# Patient Record
Sex: Male | Born: 1945
Health system: Southern US, Community
[De-identification: ages and names within clinical notes are randomized; demographics above are authoritative.]

## PROBLEM LIST (undated history)

## (undated) DIAGNOSIS — I119 Hypertensive heart disease without heart failure: Secondary | ICD-10-CM

## (undated) DIAGNOSIS — I499 Cardiac arrhythmia, unspecified: Secondary | ICD-10-CM

## (undated) DIAGNOSIS — G629 Polyneuropathy, unspecified: Secondary | ICD-10-CM

## (undated) DIAGNOSIS — E119 Type 2 diabetes mellitus without complications: Secondary | ICD-10-CM

## (undated) DIAGNOSIS — F32A Depression, unspecified: Secondary | ICD-10-CM

## (undated) DIAGNOSIS — F329 Major depressive disorder, single episode, unspecified: Secondary | ICD-10-CM

## (undated) DIAGNOSIS — M1990 Unspecified osteoarthritis, unspecified site: Secondary | ICD-10-CM

## (undated) DIAGNOSIS — I10 Essential (primary) hypertension: Secondary | ICD-10-CM

## (undated) DIAGNOSIS — I63512 Cerebral infarction due to unspecified occlusion or stenosis of left middle cerebral artery: Secondary | ICD-10-CM

## (undated) DIAGNOSIS — Z9884 Bariatric surgery status: Secondary | ICD-10-CM

## (undated) DIAGNOSIS — F0280 Dementia in other diseases classified elsewhere without behavioral disturbance: Secondary | ICD-10-CM

## (undated) DIAGNOSIS — G300 Alzheimer's disease with early onset: Secondary | ICD-10-CM

## (undated) DIAGNOSIS — E785 Hyperlipidemia, unspecified: Secondary | ICD-10-CM

## (undated) DIAGNOSIS — I5032 Chronic diastolic (congestive) heart failure: Secondary | ICD-10-CM

## (undated) DIAGNOSIS — H9193 Unspecified hearing loss, bilateral: Secondary | ICD-10-CM

## (undated) DIAGNOSIS — I421 Obstructive hypertrophic cardiomyopathy: Secondary | ICD-10-CM

## (undated) DIAGNOSIS — I639 Cerebral infarction, unspecified: Secondary | ICD-10-CM

## (undated) DIAGNOSIS — R4701 Aphasia: Secondary | ICD-10-CM

## (undated) DIAGNOSIS — M109 Gout, unspecified: Secondary | ICD-10-CM

## (undated) DIAGNOSIS — Z87442 Personal history of urinary calculi: Secondary | ICD-10-CM

## (undated) DIAGNOSIS — R4781 Slurred speech: Secondary | ICD-10-CM

## (undated) DIAGNOSIS — G4733 Obstructive sleep apnea (adult) (pediatric): Secondary | ICD-10-CM

## (undated) DIAGNOSIS — G4730 Sleep apnea, unspecified: Secondary | ICD-10-CM

## (undated) DIAGNOSIS — I480 Paroxysmal atrial fibrillation: Secondary | ICD-10-CM

## (undated) HISTORY — DX: Hypertensive heart disease without heart failure: I11.9

## (undated) HISTORY — DX: Obstructive sleep apnea (adult) (pediatric): G47.33

## (undated) HISTORY — DX: Cerebral infarction due to unspecified occlusion or stenosis of left middle cerebral artery: I63.512

## (undated) HISTORY — DX: Type 2 diabetes mellitus without complications: E11.9

## (undated) HISTORY — DX: Aphasia: R47.01

## (undated) HISTORY — DX: Hyperlipidemia, unspecified: E78.5

## (undated) HISTORY — DX: Slurred speech: R47.81

## (undated) HISTORY — DX: Essential (primary) hypertension: I10

## (undated) HISTORY — DX: Cerebral infarction, unspecified: I63.9

## (undated) HISTORY — DX: Chronic diastolic (congestive) heart failure: I50.32

## (undated) HISTORY — PX: ARTHROSCOPIC REPAIR PCL: SUR81

## (undated) HISTORY — PX: TONSILLECTOMY: SUR1361

## (undated) HISTORY — PX: JOINT REPLACEMENT: SHX530

## (undated) HISTORY — PX: LAPAROSCOPIC GASTRIC BANDING: SHX1100

## (undated) HISTORY — PX: HERNIA REPAIR: SHX51

## (undated) HISTORY — PX: KNEE SURGERY: SHX244

## (undated) HISTORY — DX: Obstructive hypertrophic cardiomyopathy: I42.1

## (undated) HISTORY — DX: Paroxysmal atrial fibrillation: I48.0

---

## 1998-12-19 ENCOUNTER — Ambulatory Visit (HOSPITAL_BASED_OUTPATIENT_CLINIC_OR_DEPARTMENT_OTHER): Admission: RE | Admit: 1998-12-19 | Discharge: 1998-12-19 | Payer: Self-pay | Admitting: Orthopedic Surgery

## 1999-01-04 ENCOUNTER — Emergency Department (HOSPITAL_COMMUNITY): Admission: EM | Admit: 1999-01-04 | Discharge: 1999-01-04 | Payer: Self-pay | Admitting: Emergency Medicine

## 1999-08-27 ENCOUNTER — Encounter: Payer: Self-pay | Admitting: Gastroenterology

## 1999-08-27 ENCOUNTER — Ambulatory Visit (HOSPITAL_COMMUNITY): Admission: RE | Admit: 1999-08-27 | Discharge: 1999-08-27 | Payer: Self-pay | Admitting: Gastroenterology

## 1999-12-12 ENCOUNTER — Ambulatory Visit (HOSPITAL_COMMUNITY): Admission: RE | Admit: 1999-12-12 | Discharge: 1999-12-12 | Payer: Self-pay | Admitting: Rheumatology

## 1999-12-12 ENCOUNTER — Encounter: Payer: Self-pay | Admitting: Rheumatology

## 2001-08-03 ENCOUNTER — Ambulatory Visit (HOSPITAL_COMMUNITY): Admission: RE | Admit: 2001-08-03 | Discharge: 2001-08-03 | Payer: Self-pay | Admitting: *Deleted

## 2001-09-21 ENCOUNTER — Encounter: Payer: Self-pay | Admitting: Surgery

## 2001-09-24 ENCOUNTER — Ambulatory Visit (HOSPITAL_COMMUNITY): Admission: RE | Admit: 2001-09-24 | Discharge: 2001-09-24 | Payer: Self-pay | Admitting: Surgery

## 2003-02-09 ENCOUNTER — Ambulatory Visit (HOSPITAL_COMMUNITY): Admission: RE | Admit: 2003-02-09 | Discharge: 2003-02-09 | Payer: Self-pay | Admitting: Gastroenterology

## 2003-04-11 ENCOUNTER — Ambulatory Visit (HOSPITAL_BASED_OUTPATIENT_CLINIC_OR_DEPARTMENT_OTHER): Admission: RE | Admit: 2003-04-11 | Discharge: 2003-04-11 | Payer: Self-pay | Admitting: Internal Medicine

## 2003-04-29 HISTORY — PX: TARSAL TUNNEL RELEASE: SUR1099

## 2003-08-08 ENCOUNTER — Observation Stay (HOSPITAL_COMMUNITY): Admission: RE | Admit: 2003-08-08 | Discharge: 2003-08-09 | Payer: Self-pay | Admitting: Orthopedic Surgery

## 2003-08-08 ENCOUNTER — Encounter (INDEPENDENT_AMBULATORY_CARE_PROVIDER_SITE_OTHER): Payer: Self-pay | Admitting: *Deleted

## 2004-08-24 ENCOUNTER — Emergency Department (HOSPITAL_COMMUNITY): Admission: EM | Admit: 2004-08-24 | Discharge: 2004-08-24 | Payer: Self-pay | Admitting: Emergency Medicine

## 2005-05-28 ENCOUNTER — Ambulatory Visit (HOSPITAL_BASED_OUTPATIENT_CLINIC_OR_DEPARTMENT_OTHER): Admission: RE | Admit: 2005-05-28 | Discharge: 2005-05-28 | Payer: Self-pay | Admitting: Internal Medicine

## 2005-06-01 ENCOUNTER — Ambulatory Visit: Payer: Self-pay | Admitting: Internal Medicine

## 2006-01-22 ENCOUNTER — Encounter: Admission: RE | Admit: 2006-01-22 | Discharge: 2006-01-22 | Payer: Self-pay | Admitting: Internal Medicine

## 2009-12-02 ENCOUNTER — Emergency Department (HOSPITAL_COMMUNITY)
Admission: EM | Admit: 2009-12-02 | Discharge: 2009-12-02 | Payer: Self-pay | Source: Home / Self Care | Admitting: Emergency Medicine

## 2009-12-07 ENCOUNTER — Observation Stay (HOSPITAL_COMMUNITY): Admission: RE | Admit: 2009-12-07 | Discharge: 2009-12-08 | Payer: Self-pay | Admitting: Urology

## 2009-12-20 ENCOUNTER — Ambulatory Visit (HOSPITAL_COMMUNITY): Admission: RE | Admit: 2009-12-20 | Discharge: 2009-12-20 | Payer: Self-pay | Admitting: Urology

## 2010-03-04 ENCOUNTER — Encounter
Admission: RE | Admit: 2010-03-04 | Discharge: 2010-03-04 | Payer: Self-pay | Source: Home / Self Care | Attending: General Surgery | Admitting: General Surgery

## 2010-03-05 ENCOUNTER — Ambulatory Visit (HOSPITAL_COMMUNITY): Admission: RE | Admit: 2010-03-05 | Discharge: 2010-03-05 | Payer: Self-pay | Admitting: General Surgery

## 2010-06-24 ENCOUNTER — Encounter: Attending: General Surgery | Admitting: *Deleted

## 2010-06-24 DIAGNOSIS — Z01818 Encounter for other preprocedural examination: Secondary | ICD-10-CM | POA: Insufficient documentation

## 2010-06-24 DIAGNOSIS — Z713 Dietary counseling and surveillance: Secondary | ICD-10-CM | POA: Insufficient documentation

## 2010-07-01 ENCOUNTER — Other Ambulatory Visit: Payer: Self-pay | Admitting: General Surgery

## 2010-07-01 ENCOUNTER — Encounter (HOSPITAL_COMMUNITY): Attending: General Surgery

## 2010-07-01 DIAGNOSIS — Z01818 Encounter for other preprocedural examination: Secondary | ICD-10-CM | POA: Insufficient documentation

## 2010-07-01 DIAGNOSIS — Z01811 Encounter for preprocedural respiratory examination: Secondary | ICD-10-CM | POA: Insufficient documentation

## 2010-07-01 LAB — SURGICAL PCR SCREEN: MRSA, PCR: NEGATIVE

## 2010-07-01 LAB — COMPREHENSIVE METABOLIC PANEL
CO2: 26 mEq/L (ref 19–32)
Chloride: 106 mEq/L (ref 96–112)
GFR calc non Af Amer: >60 mL/min (ref >60)
Glucose, Bld: 107 mg/dL — ABNORMAL HIGH (ref 70–99)
Potassium: 4.5 mEq/L (ref 3.5–5.1)
Sodium: 140 mEq/L (ref 135–145)
Total Protein: 7.9 g/dL (ref 6.0–8.3)

## 2010-07-01 LAB — CBC
HCT: 48 % (ref 39.0–52.0)
MCH: 30.3 pg (ref 26.0–34.0)
Platelets: 192 10*3/uL (ref 150–400)
RBC: 5.05 MIL/uL (ref 4.22–5.81)
RDW: 14 % (ref 11.5–15.5)
WBC: 6.3 10*3/uL (ref 4.0–10.5)

## 2010-07-01 LAB — DIFFERENTIAL
Basophils Absolute: 0 10*3/uL (ref 0.0–0.1)
Eosinophils Relative: 3 % (ref 0–5)

## 2010-07-09 ENCOUNTER — Ambulatory Visit (HOSPITAL_COMMUNITY)
Admission: RE | Admit: 2010-07-09 | Discharge: 2010-07-10 | Disposition: A | Discharge status: Home / Self Care | Source: Ambulatory Visit | Attending: General Surgery | Admitting: General Surgery

## 2010-07-09 DIAGNOSIS — E119 Type 2 diabetes mellitus without complications: Secondary | ICD-10-CM | POA: Insufficient documentation

## 2010-07-09 DIAGNOSIS — K449 Diaphragmatic hernia without obstruction or gangrene: Secondary | ICD-10-CM | POA: Insufficient documentation

## 2010-07-09 DIAGNOSIS — K219 Gastro-esophageal reflux disease without esophagitis: Secondary | ICD-10-CM | POA: Insufficient documentation

## 2010-07-09 DIAGNOSIS — G4733 Obstructive sleep apnea (adult) (pediatric): Secondary | ICD-10-CM | POA: Insufficient documentation

## 2010-07-09 DIAGNOSIS — Z6839 Body mass index (BMI) 39.0-39.9, adult: Secondary | ICD-10-CM | POA: Insufficient documentation

## 2010-07-09 DIAGNOSIS — I10 Essential (primary) hypertension: Secondary | ICD-10-CM | POA: Insufficient documentation

## 2010-07-09 DIAGNOSIS — M129 Arthropathy, unspecified: Secondary | ICD-10-CM | POA: Insufficient documentation

## 2010-07-09 LAB — GLUCOSE, CAPILLARY: Glucose-Capillary: 145 mg/dL — ABNORMAL HIGH (ref 70–99)

## 2010-07-09 NOTE — Op Note (Signed)
NAME:  Eddie Zimmerman, Eddie Zimmerman NO.:  [id number]  MEDICAL RECORD NO.:  [id number]           PATIENT TYPE:  I  LOCATION:  0002                         FACILITY:  [facility]  PHYSICIAN:  Sharlet Salina T. Kosisochukwu Goldberg, M.D.DATE OF BIRTH:  04-Jan-1946  DATE OF PROCEDURE:  07/09/2010 DATE OF DISCHARGE:                              OPERATIVE REPORT   PREOPERATIVE DIAGNOSIS:  Morbid obesity.  POSTOPERATIVE DIAGNOSIS:  Morbid obesity.  SURGICAL PROCEDURE:  Placement of laparoscopic adjustable gastric band with hiatal hernia repair.  SURGEON:  Lorne Skeens. Kapono Luhn, MD  ASSISTANT:  Mary Sella. Andrey Campanile, MD  ANESTHESIA:  General.  BRIEF HISTORY:  Eddie Zimmerman is a 65 year old male with persistent progressive morbid obesity, unresponsive to medical management, with comorbidities of obstructive sleep apnea, adult onset diabetes mellitus, hypertension, and arthritis, who presents with a BMI of 39.16.  After extensive discussion of options detailed elsewhere, we have elected to proceed with placement of laparoscopic adjustable gastric band for treatment of morbid obesity.  Preoperative workup indicates a small to moderate-sized hiatal hernia and he does have some occasional reflux and we will repair his hernia if confirmed intraoperatively.  DESCRIPTION OF OPERATION:  The patient brought to operating room, placed in supine position on the operating table, and general endotracheal anesthesia was induced.  He received preoperative IV antibiotics and subcutaneous heparin.  PAS were placed.  The abdomen was widely sterilely prepped and draped and correct patient and procedure were verified.  Access was obtained with an 11 mm OptiVu trocar in the left subcostal space and pneumoperitoneum established without difficulty. Under direct vision, a 15 mm trocar was placed laterally in the right upper quadrant.  A 12-mm trocar in the right upper quadrant midclavicular line and 11 mm trocar just above was left  in the umbilicus with camera.  Initially, a 5-mm trocar was placed laterally in the left flank.  Initial inspection of the hiatus showed some dimpling consistent with hiatal hernia.  The sizing tube was placed orally into the stomach and the 15 mL balloon was inflated and brought back and did go up through the hiatus.  The pars flaccid was then divided along the avascular area with the harmonic scalpel and the peritoneum was also incised up to the level the esophagus.  The right crus was exposed. There was a lot of fat in this area that we had good exposure and the peritoneum just anterior to the right crus was carefully incised with cautery and careful blunt dissection was carried back into the retroesophageal space.  The posterior vagus nerve and posterior aspect of the esophagus was identified and dissected anteriorly, protected, and a retroesophageal fat pad dissection down from the hiatus and the moderate-sized hernia found identifying clearly the right and left crura down to the confluence posteriorly.  I then performed a posterior crural repair with 2 interrupted 0 Ethibond sutures.  After closure of the hiatus to a normal-appearing size, the sizing tube was passed down through the hiatus without any difficulty and the 10 mL balloon blown up and brought back against the hiatus and was snugged here.  The balloon was deflated  and the tubing brought back into the upper esophagus.  The angle of His was exposed and peritoneum overlying the left crus was incised and careful blunt dissection was carried back down along the left crus toward the retrogastric space.  Following this, the finger dissector was inserted just anterior to the base of the right crus, passed easily retrogastric and deployed up through the previously dissected area of angle of His.  An AP large flushed band system was introduced in the abdomen.  The tubing passed into the finger dissector, brought retrogastric, and  then the band brought to the retrogastric tunnel without difficulty.  With the sizing tube again passed down into the stomach, the band was locked into place without any undue tension. Sizing tube was removed.  All the tubing toward the patient's fundus imbricated back over the band to the small gastric pouch with 3 interrupted 2-0 Ethibond sutures.  Band appeared to be in excellent position.  There was no bleeding and no evidence of trocar injury.  The nasal retractor was moved under direct vision.  CO2 evacuated and trocars removed after bringing the tubing through the right mid abdominal trocar site.  This incision was lengthened slightly and the tubing was cut and attached to the port which a piece of Prolene mesh was then sutured back.  A subcutaneous pocket was created and the port was positioned subcutaneously just lateral to the incision.  The tubing introduced mainly into the abdomen.  This incision was closed with running subcutaneous 3-0 Vicryl.  Skin incisions were closed with subcuticular Monocryl and Dermabond.  Sponge and needle counts were correct.  The patient was taken to recovery in good condition. Lorne Skeens. Cicley Ganesh, M.D.     Tory Emerald  D:  07/09/2010  T:  07/09/2010  Job:  [id number]  Electronically Signed by Glenna Fellows M.D. on 07/09/2010 07:48:52 PM

## 2010-07-10 ENCOUNTER — Ambulatory Visit (HOSPITAL_COMMUNITY)

## 2010-07-10 LAB — DIFFERENTIAL
Basophils Absolute: 0 10*3/uL (ref 0.0–0.1)
Eosinophils Absolute: 0 10*3/uL (ref 0.0–0.7)
Eosinophils Relative: 0 % (ref 0–5)
Neutro Abs: 9.4 10*3/uL — ABNORMAL HIGH (ref 1.7–7.7)
Neutrophils Relative %: 84 % — ABNORMAL HIGH (ref 43–77)

## 2010-07-10 LAB — GLUCOSE, CAPILLARY: Glucose-Capillary: 112 mg/dL — ABNORMAL HIGH (ref 70–99)

## 2010-07-10 LAB — CBC
MCH: 30 pg (ref 26.0–34.0)
Platelets: 163 10*3/uL (ref 150–400)

## 2010-07-11 LAB — GLUCOSE, CAPILLARY

## 2010-07-12 LAB — DIFFERENTIAL
Basophils Absolute: 0 10*3/uL (ref 0.0–0.1)
Basophils Absolute: 0 10*3/uL (ref 0.0–0.1)
Basophils Relative: 0 % (ref 0–1)
Eosinophils Absolute: 0 10*3/uL (ref 0.0–0.7)
Eosinophils Relative: 0 % (ref 0–5)
Eosinophils Relative: 1 % (ref 0–5)
Lymphocytes Relative: 7 % — ABNORMAL LOW (ref 12–46)
Monocytes Absolute: 1.1 10*3/uL — ABNORMAL HIGH (ref 0.1–1.0)
Monocytes Absolute: 1.8 10*3/uL — ABNORMAL HIGH (ref 0.1–1.0)
Monocytes Relative: 12 % (ref 3–12)
Monocytes Relative: 14 % — ABNORMAL HIGH (ref 3–12)
Neutro Abs: 10.7 10*3/uL — ABNORMAL HIGH (ref 1.7–7.7)
Neutro Abs: 6.9 10*3/uL (ref 1.7–7.7)

## 2010-07-12 LAB — URINALYSIS, ROUTINE W REFLEX MICROSCOPIC
Bilirubin Urine: NEGATIVE
Glucose, UA: NEGATIVE mg/dL
Protein, ur: NEGATIVE mg/dL
Urobilinogen, UA: 0.2 mg/dL (ref 0.0–1.0)
pH: 6 (ref 5.0–8.0)

## 2010-07-12 LAB — CBC
HCT: 36.1 % — ABNORMAL LOW (ref 39.0–52.0)
HCT: 38.5 % — ABNORMAL LOW (ref 39.0–52.0)
Hemoglobin: 12.2 g/dL — ABNORMAL LOW (ref 13.0–17.0)
Hemoglobin: 13.2 g/dL (ref 13.0–17.0)
MCH: 31.7 pg (ref 26.0–34.0)
MCHC: 33.8 g/dL (ref 30.0–36.0)
MCV: 93.6 fL (ref 78.0–100.0)
MCV: 93.6 fL (ref 78.0–100.0)
RDW: 14.5 % (ref 11.5–15.5)
RDW: 14.6 % (ref 11.5–15.5)
WBC: 13.5 10*3/uL — ABNORMAL HIGH (ref 4.0–10.5)

## 2010-07-12 LAB — COMPREHENSIVE METABOLIC PANEL
Alkaline Phosphatase: 52 U/L (ref 39–117)
BUN: 13 mg/dL (ref 6–23)
CO2: 29 mEq/L (ref 19–32)
Chloride: 102 mEq/L (ref 96–112)
Creatinine, Ser: 1.86 mg/dL — ABNORMAL HIGH (ref 0.4–1.5)
GFR calc Af Amer: 45 mL/min — ABNORMAL LOW (ref >60)
Sodium: 139 mEq/L (ref 135–145)
Total Protein: 7.2 g/dL (ref 6.0–8.3)

## 2010-07-12 LAB — GLUCOSE, CAPILLARY

## 2010-07-12 LAB — URINE CULTURE: Colony Count: 35000

## 2010-07-12 LAB — BASIC METABOLIC PANEL
BUN: 12 mg/dL (ref 6–23)
BUN: 13 mg/dL (ref 6–23)
CO2: 30 mEq/L (ref 19–32)
Chloride: 101 mEq/L (ref 96–112)
Chloride: 101 mEq/L (ref 96–112)
GFR calc non Af Amer: 49 mL/min — ABNORMAL LOW (ref >60)
Glucose, Bld: 143 mg/dL — ABNORMAL HIGH (ref 70–99)
Glucose, Bld: 179 mg/dL — ABNORMAL HIGH (ref 70–99)
Potassium: 3.7 mEq/L (ref 3.5–5.1)
Potassium: 4.2 mEq/L (ref 3.5–5.1)
Sodium: 138 mEq/L (ref 135–145)
Sodium: 139 mEq/L (ref 135–145)

## 2010-07-12 LAB — URINE MICROSCOPIC-ADD ON

## 2010-07-30 ENCOUNTER — Encounter: Attending: General Surgery

## 2010-07-30 DIAGNOSIS — Z09 Encounter for follow-up examination after completed treatment for conditions other than malignant neoplasm: Secondary | ICD-10-CM | POA: Insufficient documentation

## 2010-07-30 DIAGNOSIS — Z9884 Bariatric surgery status: Secondary | ICD-10-CM | POA: Insufficient documentation

## 2010-07-30 DIAGNOSIS — Z713 Dietary counseling and surveillance: Secondary | ICD-10-CM | POA: Insufficient documentation

## 2010-09-10 ENCOUNTER — Ambulatory Visit: Admitting: *Deleted

## 2010-09-13 NOTE — Op Note (Signed)
[place]. [facility]  Patient:    Eddie Zimmerman, Eddie Zimmerman Visit Number: [id number] MRN: [id number]          Service Type: DSU Location: DAY Attending Physician:  Andre Lefort Dictated by:   Sandria Bales. Ezzard Standing, M.D. Proc. Date: 09/24/01 Admit Date:  09/24/2001 Discharge Date: 09/24/2001   CC:         Cherie Ouch, M.D.   Operative Report  DATE OF BIRTH:  1946/02/01. [facility] [phone].  PREOPERATIVE DIAGNOSIS:  Umbilical hernia.  POSTOPERATIVE DIAGNOSES: 1. Umbilical hernia. 2. Small ventral hernia cephalad to umbilical hernia.  PROCEDURE:  Repair of both umbilical and ventral hernia.  SURGEON:  Kristine Garbe. Ezzard Standing, M.D.  ANESTHESIA:  General LMA with 50 cc of 0.25% Marcaine.  INDICATION FOR PROCEDURE:  [name] is a 65 year old white male who has noticed an umbilical hernia for three to four months, had some severe pain within the last couple of weeks, and now comes for repair of this hernia.  DESCRIPTION OF PROCEDURE:  The patient was placed in a supine position and given a general LMA anesthesia.  His abdomen is shaved, prepped with Betadine solution, and sterilely draped.  A smiling infraumbilical incision was made with sharp dissection and elevating the skin of the umbilicus off the fascia. The patient had an approximately 2 cm defect at the umbilicus.  This was reduced without difficulty.  Then about 2 cm cephalad to the umbilical hernia the patient also had a very small ventral hernia with maybe a 1 cm defect in the fascia with some preperitoneal fat protruding through this.  I freed up skin and subcutaneous tissues from around each hernia defect.  I closed the ventral hernia with a single figure-of-eight 0 Novofil suture, and I closed the umbilical hernia with interrupted, inverted 0 Novofil sutures.  The wound was then irrigated.  The skin was tacked back down to the fascia using 3-0 Vicryl sutures.  The skin was  closed with a 5-0 Vicryl suture, painted with tincture of benzoin and Steri-Strips and sterilely dressed.  The patient tolerated the procedure well, was transported to the recovery room in good condition.  Sponge and needle count were reported to be correct.  The patient will be discharged home today.  He will be given Vicodin for pain and wear an abdominal binder and be seen back in the office in two weeks. Dictated by:   Sandria Bales. Ezzard Standing, M.D. Attending Physician:  Andre Lefort DD:  09/24/01 TD:  09/27/01 Job: [id number]

## 2010-09-13 NOTE — Op Note (Signed)
NAME:  Eddie Zimmerman, Eddie Zimmerman                          ACCOUNT NO.:  [id number]   MEDICAL RECORD NO.:  [id number]                   PATIENT TYPE:  OBV   LOCATION:  0470                                 FACILITY:  [facility]   PHYSICIAN:  Dionne Ano. Everlene Other, M.D.         DATE OF BIRTH:  [birth year]   DATE OF PROCEDURE:  08/08/2003  DATE OF DISCHARGE:                                 OPERATIVE REPORT   PREOPERATIVE DIAGNOSES:  1. Painful right elbow mass.  2. Painful right forearm mass.  3. Osteophyte about the right olecranon with previous olecranon mass removal     being noted.   POSTOPERATIVE DIAGNOSES:  1. Painful right elbow mass.  2. Painful right forearm mass.  3. Osteophyte about the right olecranon with previous olecranon mass removal     being noted.   PROCEDURE:  1. Right elbow large olecranon mass removal.  2. Right elbow triceps debridement.  3. Right elbow partial ostectomy about the olecranon (removal of olecranon     spur).  4. Right elbow ulnar nerve release/neurolysis.  5. Medal antebrachial cutaneous nerve neurolysis.  6. Burying procedure secondary to previous neuroma which appeared to be     encased in scar.  7. Removal of forearm mass 5 x 5 cm deep in location.  8. Stress radiography.   SURGEON:  Dominica Severin, M.D.   ASSISTANT:  Karie Chimera, P.A.-C.   COMPLICATIONS:  None.   SPECIMENS:  Two.   DRAINS:  One.   INDICATIONS FOR PROCEDURE:  This patient presents with the above-mentioned  diagnoses.  I have counseled him in regards to the risks and benefits of  surgery, including the risk of infection, bleeding, anesthesia, damage to  normal structures, and failure of the surgery to accomplish its intended  goals of relieving symptoms and restoring function.  With this in mind, he  desires to proceed.  All questions have been encouraged and answered  preoperatively.   OPERATIVE FINDINGS:  This patient had two separate masses deep in location  removed.   Partial olecranon ostectomy with removal of spur and triceps  debridement was performed as was stress radiography and a neurolysis/limited  decompression of the ulnar nerve and medial antebrachial cutaneous nerve,  neurolysis and burying procedure.  The patient had no complicating features  with the surgery.   OPERATION IN DETAIL:  The patient was seen by myself and anesthesia, was  counseled in the preop holding area.  His infraclavicular block was somewhat  incomplete and thus the patient underwent a general anesthetic once in the  operative suite.  The general anesthetic was performed under the direction  of Dr. Council Mechanic.  Following this, he was laid supine, appropriately padded,  prepped and draped in the usual sterile fashion about the right upper  extremity.  I padded him nicely, and I gave him preoperative Ancef for  antibiotic prophylaxis.  Following this, the arm was elevated.  Tourniquet  was inflated to 250 mmHg.  An incision posteriorly was made with a removal  of the portion of the skin in this region.  I then dissected down and  circumferentially identified the large posterior mass.  It was removed off  of the triceps and olecranon region.  It was sent for specimen.  This was  quite large and during the dissection, medial antebrachial cutaneous nerve  neurolysis was performed with a noted neuroma being present which was cut  and then buried.  This was likely related to his previous surgery, in my  opinion.  The patient had a large amount of scar tissue which was  meticulously dissected.  During the course of this, I did perform a limited  release of the ulnar nerve and neurolysis of the nerve so as to protect it  and keep it away from harm.  This was encased in scar tissue and was  released.   Thus, a neurolysis of the ulnar nerve at the elbow and medial antebrachial  cutaneous nerve with burying of a neuroma stump at its end were performed  without difficulty as was removal  of a mass about the elbow.  Following  this, I performed a triceps tendon debridement.  I did not detach it but  kept this intact.  The patient then had removal of the bony spur about the  olecranon.  He was meticulously explored and all sebaceous deposits removed.  Following this, a separate 5 x 5 deep mass was removed about the forearm.  This required an extended incision.  The incision was made and lengthened,  and circumferential dissection was carried out to remove this in its  entirety.  This was done to my satisfaction without difficulty.  At this  time, the tourniquet was deflated, and an epinephrine soaked sponge was  placed in the wound.  This allowed for hemostasis as did cautery.  Following  this, I performed stress radiography revealing the olecranon to not have any  significant spurring.  I was pleased with this.  I then placed a medium  Hemovac drain in the wound and performed closure with 3-0 Vicryl tacking  down the subcu to the fascial region so as to lessen the likelihood of a  postop hematoma.  This was done meticulously.  Following this, the patient  then underwent closure of the skin edge with a Prolene suture.  Drain was  hooked up to suction.  Sterile dressing was applied and anterior plaster  slab to prevent excessive flexion was placed.  The patient tolerated this  well without difficulty.  He was taken to the recovery room in stable  condition.  He will be monitored overnight due to the fact that he has sleep  apnea, and we will keep him on a continuous pulse oxygen with his CPAP and  O2.  We will also place him close to a nurses station per my request.  In  addition to this, we will plan of IV antibiotics, pain management according  to his needs and other postop measures.  I have discussed all findings with  his significant other, and we will proceed accordingly.                                               Dionne Ano. Everlene Other, M.D.   Nash Mantis  D:   08/08/2003  T:  08/08/2003  Job:  [id number]

## 2010-09-13 NOTE — Op Note (Signed)
   NAME:  MOXON, MESSLER                          ACCOUNT NO.:  [id number]   MEDICAL RECORD NO.:  [id number]                   PATIENT TYPE:  AMB   LOCATION:  ENDO                                 FACILITY:  [facility]   PHYSICIAN:  Danise Edge, M.D.                DATE OF BIRTH:  Sep 08, 1945   DATE OF PROCEDURE:  02/09/2003  DATE OF DISCHARGE:                                 OPERATIVE REPORT   PROCEDURE PERFORMED:  Colonoscopy.   ENDOSCOPIST:  Charolett Bumpers, M.D.   INDICATIONS FOR PROCEDURE:  Mr. Arshad Oberholzer. Jury is a 65 year old male born  1946-04-01.  Mr. Teicher is scheduled to undergo his first screening  colonoscopy with polypectomy to prevent colon cancer.   PREMEDICATION:  Versed 10 mg, Demerol 50 mg.   DESCRIPTION OF PROCEDURE:  After obtaining informed consent, Mr. Bialas was  placed in the left lateral decubitus position.  I administered intravenous  Demerol and intravenous Versed to achieve conscious sedation for the  procedure.  The patient's blood pressure, oxygen saturation and cardiac  rhythm were monitored throughout the procedure and documented in the medical  record.   Anal inspection was normal.  Digital rectal exam was normal.  The pediatric  Olympus video colonoscope was introduced into the rectum and advanced to  cecum.  Colonic preparation for the exam today was excellent.   Rectum:  Normal.   Sigmoid colon and descending colon:  Left colonic diverticulosis.   Splenic flexure:  Normal.   Transverse colon:  Normal.   Hepatic flexure:  Normal.   Ascending colon:  Normal.   Cecum and ileocecal valve:  Normal.    ASSESSMENT:  Normal screening proctocolonoscopy to the cecum.  No endoscopic  evidence for the presence of colorectal neoplasia.                                                  Danise Edge, M.D.    MJ/MEDQ  D:  02/09/2003  T:  02/09/2003  Job:  [id number]   cc:   Ike Bene, M.D.  [address]   [place]  [place] [address]  Fax: [phone]

## 2010-09-13 NOTE — Procedures (Signed)
NAME:  Eddie Zimmerman, Eddie Zimmerman NO.:  [id number]   MEDICAL RECORD NO.:  [id number]          PATIENT TYPE:  OUT   LOCATION:  SLEEP CENTER                 [facility]ACILITY:  [facility]   PHYSICIAN:  Clinton D. Maple Hudson, M.D. DATE OF BIRTH:  26-Feb-1946   DATE OF STUDY:                              NOCTURNAL POLYSOMNOGRAM   REFERRING PHYSICIAN:  Dr. Ladell Pier.   DATE OF STUDY:  May 28, 2005.   INDICATION FOR STUDY:  Hypersomnia with sleep apnea.   EPWORTH SLEEPINESS SCORE:  8/24.   BMI:  35.7.   WEIGHT:  250 pounds.   A baseline diagnostic study on April 11, 2003 had reported an AHI of 80  per hour. C-PAP has been used at 12 CWP using an Innomed Nasalaire nasal  pillows as mask.   SLEEP ARCHITECTURE:  Total sleep time 365 minutes with sleep efficiency 83%.  Stage I 5%, stage II 78%, stages III and IV 9%, REM 9% of total sleep time.  Sleep latency 27 minutes, REM latency 189 minutes, awake after sleep onset  49 minutes, arousal index 7.7. Bedtime medication not reported.   RESPIRATORY DATA:  C-PAP titration protocol: C-PAP was titrated from 12 CWP  (AHI 5.1 per hour) to 17 CWP. He sustained adequate control in most sleep  positions at a pressure 13 CWP (AHI 2.9 per hour). Because of some minor  residual snoring, the technician tried him at 16 CWP, AHI 4.6 per hour. The  patient's home C-PAP mask was used with heated humidifier.   OXYGEN DATA:  Breakthrough snoring primarily at lower pressures, controlled  at 16 CWP. Oxygen saturation on C-PAP control held 92 to 96% on room air.   CARDIAC DATA:  Sinus rhythm with occasional PVC.   MOVEMENT/PARASOMNIA:  Occasional leg jerks with little effect on sleep.   IMPRESSION/RECOMMENDATIONS:  1.  C-PAP titration to a suggested home trial pressure of 16 CWP, AHI 4.6      per hour. This seem to give best control of breakthrough snoring but      adequate event control was obtained at 13 CWP and a lower pressure may      be more  comfortable. His home mask was used with heated humidifier.  2.  Oxygen saturation fluctuated. There may be underlying cardiopulmonary      disease or this may reflect the mild instability of nasal pillows style      masks. If there is concern, overnight oxygen saturation monitoring can      be performed while he wears his C-PAP on room air at home. Baseline      diagnostic NPSG on April 11, 2003 had reported an AHI of 80 per hour      with recommended C-PAP setting at that time 11 CWP.      Clinton D. Maple Hudson, M.D.  Diplomate, Biomedical engineer of Sleep Medicine  Electronically Signed     CDY/MEDQ  D:  06/01/2005 13:48:28  T:  06/02/2005 01:16:33  Job:  [id number]

## 2010-11-08 ENCOUNTER — Encounter (INDEPENDENT_AMBULATORY_CARE_PROVIDER_SITE_OTHER): Payer: Self-pay | Admitting: General Surgery

## 2010-11-21 ENCOUNTER — Encounter (INDEPENDENT_AMBULATORY_CARE_PROVIDER_SITE_OTHER)

## 2010-11-21 ENCOUNTER — Ambulatory Visit (INDEPENDENT_AMBULATORY_CARE_PROVIDER_SITE_OTHER): Admitting: General Surgery

## 2010-11-21 ENCOUNTER — Encounter (INDEPENDENT_AMBULATORY_CARE_PROVIDER_SITE_OTHER): Payer: Self-pay | Admitting: General Surgery

## 2010-11-21 NOTE — Patient Instructions (Signed)
Get back into exercise routine. Concentrate on eating slowly with small bites. Separate liquids and solids.

## 2010-11-21 NOTE — Progress Notes (Signed)
Patient returns for followup status post lap band placement March 2012. His last physical was in May. Since his last visit he fell and broke a rib and has unfortunately been unable to exercise. He has had a couple of episodes of food sticking but can relate this to eating too fast. If he eats slowly he is definitely able to be more than a cup of food.  On examination his weight is down just over a pound to 243.8 which represents a 29.8 pound weight loss from preop.  With this information we went ahead with a 1/2 cc fill today. We reviewed diet and exercise strategies. He is now able to begin exercising regularly. He will return in 4-6 weeks.

## 2011-01-03 ENCOUNTER — Encounter (INDEPENDENT_AMBULATORY_CARE_PROVIDER_SITE_OTHER): Payer: Self-pay | Admitting: General Surgery

## 2011-02-07 ENCOUNTER — Ambulatory Visit (INDEPENDENT_AMBULATORY_CARE_PROVIDER_SITE_OTHER): Admitting: General Surgery

## 2011-02-07 ENCOUNTER — Encounter (INDEPENDENT_AMBULATORY_CARE_PROVIDER_SITE_OTHER): Payer: Self-pay | Admitting: General Surgery

## 2011-02-07 DIAGNOSIS — Z4651 Encounter for fitting and adjustment of gastric lap band: Secondary | ICD-10-CM

## 2011-02-07 NOTE — Patient Instructions (Addendum)
Liquid diet only until Monday. Continue her regular exercise. Call for any problems and return to months.Your weight at presentation was 281 pounds

## 2011-02-07 NOTE — Progress Notes (Signed)
Patient returns for followup of lap band placed July 09, 2010. We last did a fill on November 21, 2010. He did notice some increase in restriction following that and had a couple episodes of food sticking a month or 2 ago. He now however is having gradually over the last restriction. He is able to eat more than a entire chicken breast at a setting. Symptoms of over restriction currently.  On exam his weight is down to 240 which is a 4 pound weight loss since the last fill and a 34 pound weight loss in surgery. Port site looks fine and abdomen is benign.  With this information we went ahead with a 1/2 cc fill which brings him up to 7.5 cc. He will return in 2 months. He was able to tolerate water well after the fill.

## 2011-04-17 ENCOUNTER — Encounter (INDEPENDENT_AMBULATORY_CARE_PROVIDER_SITE_OTHER): Admitting: General Surgery

## 2011-05-21 ENCOUNTER — Encounter (INDEPENDENT_AMBULATORY_CARE_PROVIDER_SITE_OTHER): Admitting: General Surgery

## 2011-07-11 ENCOUNTER — Encounter (INDEPENDENT_AMBULATORY_CARE_PROVIDER_SITE_OTHER): Admitting: General Surgery

## 2011-09-18 ENCOUNTER — Encounter (INDEPENDENT_AMBULATORY_CARE_PROVIDER_SITE_OTHER): Admitting: General Surgery

## 2011-09-19 ENCOUNTER — Encounter (INDEPENDENT_AMBULATORY_CARE_PROVIDER_SITE_OTHER): Admitting: General Surgery

## 2011-11-13 ENCOUNTER — Encounter (INDEPENDENT_AMBULATORY_CARE_PROVIDER_SITE_OTHER): Admitting: General Surgery

## 2012-01-16 ENCOUNTER — Telehealth (INDEPENDENT_AMBULATORY_CARE_PROVIDER_SITE_OTHER): Payer: Self-pay | Admitting: General Surgery

## 2012-01-16 NOTE — Telephone Encounter (Signed)
Left message on voicemail stating the patient needs to call [facility] at [phone] to schedule a follow-up appointment....cef °

## 2012-09-03 ENCOUNTER — Emergency Department (HOSPITAL_COMMUNITY)
Admission: EM | Admit: 2012-09-03 | Discharge: 2012-09-04 | Disposition: A | Discharge status: Home / Self Care | Attending: Emergency Medicine | Admitting: Emergency Medicine

## 2012-09-03 ENCOUNTER — Encounter (HOSPITAL_COMMUNITY): Payer: Self-pay | Admitting: Emergency Medicine

## 2012-09-03 DIAGNOSIS — Z79899 Other long term (current) drug therapy: Secondary | ICD-10-CM | POA: Insufficient documentation

## 2012-09-03 DIAGNOSIS — Z9889 Other specified postprocedural states: Secondary | ICD-10-CM | POA: Insufficient documentation

## 2012-09-03 DIAGNOSIS — Z87442 Personal history of urinary calculi: Secondary | ICD-10-CM | POA: Insufficient documentation

## 2012-09-03 DIAGNOSIS — R300 Dysuria: Secondary | ICD-10-CM | POA: Insufficient documentation

## 2012-09-03 DIAGNOSIS — N23 Unspecified renal colic: Secondary | ICD-10-CM | POA: Insufficient documentation

## 2012-09-03 DIAGNOSIS — Z9884 Bariatric surgery status: Secondary | ICD-10-CM | POA: Insufficient documentation

## 2012-09-03 DIAGNOSIS — M109 Gout, unspecified: Secondary | ICD-10-CM | POA: Insufficient documentation

## 2012-09-03 DIAGNOSIS — Z7982 Long term (current) use of aspirin: Secondary | ICD-10-CM | POA: Insufficient documentation

## 2012-09-03 DIAGNOSIS — E119 Type 2 diabetes mellitus without complications: Secondary | ICD-10-CM | POA: Insufficient documentation

## 2012-09-03 DIAGNOSIS — I10 Essential (primary) hypertension: Secondary | ICD-10-CM | POA: Insufficient documentation

## 2012-09-03 HISTORY — DX: Essential (primary) hypertension: I10

## 2012-09-03 HISTORY — DX: Type 2 diabetes mellitus without complications: E11.9

## 2012-09-03 HISTORY — DX: Gout, unspecified: M10.9

## 2012-09-03 LAB — COMPREHENSIVE METABOLIC PANEL
BUN: 20 mg/dL (ref 6–23)
CO2: 27 mEq/L (ref 19–32)
Calcium: 9.9 mg/dL (ref 8.4–10.5)
Chloride: 101 mEq/L (ref 96–112)
Creatinine, Ser: 1.16 mg/dL (ref 0.50–1.35)
GFR calc non Af Amer: 65 mL/min — ABNORMAL LOW (ref >90)
Total Bilirubin: 0.7 mg/dL (ref 0.3–1.2)

## 2012-09-03 LAB — CBC WITH DIFFERENTIAL/PLATELET
Basophils Relative: 0 % (ref 0–1)
Eosinophils Relative: 3 % (ref 0–5)
HCT: 46.4 % (ref 39.0–52.0)
Hemoglobin: 15.6 g/dL (ref 13.0–17.0)
MCHC: 33.6 g/dL (ref 30.0–36.0)
MCV: 94.3 fL (ref 78.0–100.0)
Monocytes Absolute: 1.2 10*3/uL — ABNORMAL HIGH (ref 0.1–1.0)
Monocytes Relative: 11 % (ref 3–12)
Neutro Abs: 5.7 10*3/uL (ref 1.7–7.7)
RDW: 13.8 % (ref 11.5–15.5)

## 2012-09-03 LAB — URINALYSIS, ROUTINE W REFLEX MICROSCOPIC: Specific Gravity, Urine: 1.028 (ref 1.005–1.030)

## 2012-09-03 LAB — URINE MICROSCOPIC-ADD ON

## 2012-09-03 LAB — POTASSIUM: Potassium: 3.8 mEq/L (ref 3.5–5.1)

## 2012-09-03 MED ORDER — KETOROLAC TROMETHAMINE 10 MG PO TABS: 10.0000 mg | ORAL_TABLET | Freq: Four times a day (QID) | ORAL | Status: DC | PRN

## 2012-09-03 MED ORDER — ONDANSETRON 4 MG PO TBDP: ORAL_TABLET | ORAL | Status: DC

## 2012-09-03 MED ORDER — HYDROMORPHONE HCL PF 1 MG/ML IJ SOLN
1.0000 mg | Freq: Once | INTRAMUSCULAR | Status: AC
  Administered 2012-09-03: 1 mg via INTRAVENOUS
  Filled 2012-09-03: qty 1

## 2012-09-03 MED ORDER — OXYCODONE-ACETAMINOPHEN 5-325 MG PO TABS: 1.0000 | ORAL_TABLET | ORAL | Status: DC | PRN

## 2012-09-03 MED ORDER — TAMSULOSIN HCL 0.4 MG PO CAPS: 0.4000 mg | ORAL_CAPSULE | Freq: Every day | ORAL | Status: DC

## 2012-09-03 MED ORDER — ONDANSETRON HCL 4 MG/2ML IJ SOLN
4.0000 mg | Freq: Once | INTRAMUSCULAR | Status: AC
  Administered 2012-09-03: 4 mg via INTRAVENOUS
  Filled 2012-09-03: qty 2

## 2012-09-03 MED ORDER — KETOROLAC TROMETHAMINE 30 MG/ML IJ SOLN
30.0000 mg | Freq: Once | INTRAMUSCULAR | Status: AC
  Administered 2012-09-03: 30 mg via INTRAVENOUS
  Filled 2012-09-03: qty 1

## 2012-09-03 MED ORDER — SODIUM CHLORIDE 0.9 % IV BOLUS (SEPSIS)
1000.0000 mL | Freq: Once | INTRAVENOUS | Status: AC
  Administered 2012-09-03: 1000 mL via INTRAVENOUS

## 2012-09-03 NOTE — ED Notes (Signed)
Pt c/o R groin and R flank pain, onset 1100, pt has had multiple kidney stones. Pt states pain worse this time. +n/v

## 2012-09-03 NOTE — Discharge Instructions (Signed)

## 2012-09-03 NOTE — ED Provider Notes (Signed)
History     CSN: [id number]  Arrival date & time 09/03/12  2005   First MD Initiated Contact with Patient 09/03/12 2020      Chief Complaint  Patient presents with  . Flank Pain    (Consider location/radiation/quality/duration/timing/severity/associated sxs/prior treatment) HPI Pt with history of multiple bl renal stones followed by Dr Laverle Patter. Pt had normal R renal colic pain starting in R flank and radiating to R groin beginning this AM. Pain was moderately responsive to hydrocodone. No fever or chills. No gross hematuria.  Past Medical History  Diagnosis Date  . Renal disorder     kidney stones  . Diabetes mellitus without complication   . Hypertension   . Gout     Past Surgical History  Procedure Laterality Date  . Laparoscopic gastric banding    . Knee surgery    . Hernia repair      No family history on file.  History  Substance Use Topics  . Smoking status: Never Smoker   . Smokeless tobacco: Never Used  . Alcohol Use: Yes     Comment: rare      Review of Systems  Constitutional: Negative for fever and chills.  Respiratory: Negative for cough and shortness of breath.   Cardiovascular: Negative for chest pain.  Gastrointestinal: Negative for nausea, vomiting and abdominal pain.  Genitourinary: Positive for dysuria and flank pain. Negative for hematuria.  Musculoskeletal: Negative for myalgias.  Skin: Negative for rash and wound.  Neurological: Negative for dizziness, weakness, light-headedness, numbness and headaches.  All other systems reviewed and are negative.    Allergies  Review of patient's allergies indicates no known allergies.  Home Medications   Current Outpatient Rx  Name  Route  Sig  Dispense  Refill  . acetaminophen (TYLENOL) 500 MG tablet   Oral   Take 500 mg by mouth every 6 (six) hours as needed for pain.         [name] allopurinol (ZYLOPRIM) 300 MG tablet   Oral   Take 300 mg by mouth daily.           [name] aspirin 81 MG  tablet   Oral   Take 81 mg by mouth daily.           [name] atorvastatin (LIPITOR) 40 MG tablet   Oral   Take 40 mg by mouth at bedtime.         . B Complex Vitamins (B COMPLEX PO)   Oral   Take by mouth daily.           . Calcium Carbonate-Vit D-Min (CALTRATE PLUS PO)   Oral   Take 1 tablet by mouth.         . escitalopram (LEXAPRO) 10 MG tablet   Oral   Take 10 mg by mouth daily.           . fish oil-omega-3 fatty acids 1000 MG capsule   Oral   Take 2 g by mouth daily.           [name] lisinopril (PRINIVIL,ZESTRIL) 20 MG tablet   Oral   Take 20 mg by mouth daily.           . Multiple Vitamin (MULTIVITAMIN PO)   Oral   Take by mouth daily.           . naproxen sodium (ANAPROX) 220 MG tablet   Oral   Take 220 mg by mouth 2 (two) times daily with a meal.         .  pioglitazone (ACTOS) 15 MG tablet   Oral   Take 15 mg by mouth daily.           [name] ketorolac (TORADOL) 10 MG tablet   Oral   Take 1 tablet (10 mg total) by mouth every 6 (six) hours as needed for pain.   20 tablet   0   . ondansetron (ZOFRAN ODT) 4 MG disintegrating tablet      4mg  ODT q4 hours prn nausea/vomit   6 tablet   0   . oxyCODONE-acetaminophen (PERCOCET) 5-325 MG per tablet   Oral   Take 1 tablet by mouth every 4 (four) hours as needed for pain.   10 tablet   0   . tamsulosin (FLOMAX) 0.4 MG CAPS   Oral   Take 1 capsule (0.4 mg total) by mouth daily.   30 capsule   0     BP 108/59  Pulse 57  Temp(Src) 98.6 F (37 C) (Oral)  Resp 16  Wt 250 lb (113.399 kg)  BMI 35.87 kg/m2  SpO2 96%  Physical Exam  Nursing note and vitals reviewed. Constitutional: He is oriented to person, place, and time. He appears well-developed and well-nourished. No distress.  HENT:  Head: Normocephalic and atraumatic.  Mouth/Throat: Oropharynx is clear and moist.  Eyes: EOM are normal. Pupils are equal, round, and reactive to light.  Neck: Normal range of motion. Neck supple.   Cardiovascular: Normal rate and regular rhythm.   Pulmonary/Chest: Effort normal and breath sounds normal. No respiratory distress. He has no wheezes. He has no rales. He exhibits no tenderness.  Abdominal: Soft. Bowel sounds are normal. He exhibits no distension and no mass. There is no tenderness. There is no rebound and no guarding.  Musculoskeletal: Normal range of motion. He exhibits no edema and no tenderness.  Neurological: He is alert and oriented to person, place, and time.  Moves all ext without deficit.   Skin: Skin is warm and dry. No rash noted. No erythema.  Psychiatric: He has a normal mood and affect. His behavior is normal.    ED Course  Procedures (including critical care time)  Labs Reviewed  CBC WITH DIFFERENTIAL - Abnormal; Notable for the following:    Monocytes Absolute 1.2 (*)    All other components within normal limits  COMPREHENSIVE METABOLIC PANEL - Abnormal; Notable for the following:    Potassium 6.0 (*)    Glucose, Bld 104 (*)    Total Protein 8.5 (*)    AST 65 (*)    GFR calc non Af Amer 65 (*)    GFR calc Af Amer 75 (*)    All other components within normal limits  URINALYSIS, ROUTINE W REFLEX MICROSCOPIC - Abnormal; Notable for the following:    Color, Urine AMBER (*)    APPearance CLOUDY (*)    Hgb urine dipstick LARGE (*)    Protein, ur 30 (*)    Leukocytes, UA SMALL (*)    All other components within normal limits  URINE MICROSCOPIC-ADD ON - Abnormal; Notable for the following:    Casts HYALINE CASTS (*)    All other components within normal limits  POTASSIUM   No results found.   1. Renal colic on right side       MDM  At this time I believe imaging is not needed given known history of multiple bilateral 1-25mm renal stones.   Pain is completely resolved at this time. K+ elevated with normal renal function. Most  likely due to hemolysis. Will repeat.       Loren Racer, MD 09/03/12 [phone]

## 2013-02-07 DIAGNOSIS — G4730 Sleep apnea, unspecified: Secondary | ICD-10-CM | POA: Diagnosis not present

## 2013-02-07 DIAGNOSIS — M109 Gout, unspecified: Secondary | ICD-10-CM | POA: Diagnosis not present

## 2013-02-07 DIAGNOSIS — E119 Type 2 diabetes mellitus without complications: Secondary | ICD-10-CM | POA: Diagnosis not present

## 2013-02-07 DIAGNOSIS — I10 Essential (primary) hypertension: Secondary | ICD-10-CM | POA: Diagnosis not present

## 2013-02-07 DIAGNOSIS — E782 Mixed hyperlipidemia: Secondary | ICD-10-CM | POA: Diagnosis not present

## 2013-03-03 ENCOUNTER — Other Ambulatory Visit: Payer: Self-pay

## 2013-03-15 ENCOUNTER — Ambulatory Visit: Admitting: Interventional Cardiology

## 2013-04-07 ENCOUNTER — Ambulatory Visit (INDEPENDENT_AMBULATORY_CARE_PROVIDER_SITE_OTHER): Payer: Medicare Other | Admitting: Interventional Cardiology

## 2013-04-07 ENCOUNTER — Encounter: Payer: Self-pay | Admitting: Interventional Cardiology

## 2013-04-07 ENCOUNTER — Encounter: Payer: Self-pay | Admitting: Cardiology

## 2013-04-07 VITALS — BP 114/80 | HR 58 | Ht 70.0 in | Wt 260.0 lb

## 2013-04-07 DIAGNOSIS — I421 Obstructive hypertrophic cardiomyopathy: Secondary | ICD-10-CM | POA: Insufficient documentation

## 2013-04-07 DIAGNOSIS — I10 Essential (primary) hypertension: Secondary | ICD-10-CM

## 2013-04-07 DIAGNOSIS — E782 Mixed hyperlipidemia: Secondary | ICD-10-CM | POA: Insufficient documentation

## 2013-04-07 DIAGNOSIS — G4733 Obstructive sleep apnea (adult) (pediatric): Secondary | ICD-10-CM

## 2013-04-07 HISTORY — DX: Obstructive sleep apnea (adult) (pediatric): G47.33

## 2013-04-07 HISTORY — DX: Obstructive hypertrophic cardiomyopathy: I42.1

## 2013-04-07 HISTORY — DX: Essential (primary) hypertension: I10

## 2013-04-07 NOTE — Progress Notes (Signed)
Patient ID: Eddie Zimmerman, male   DOB: [birth year], 67 y.o.   MRN: [id number]    [address] [place], [place]  [address] Phone: [phone] Fax:  [phone]  Date:  04/07/2013   ID:  Eddie Zimmerman, DOB 07-Jun-1945, MRN [id number]  PCP:  Katy Apo, MD      History of Present Illness: Eddie Zimmerman is a 67 y.o. male who has OSA. He had lap band surgery. He walks some. he uses the elliptical and gets his HR up to 120-130.  TOlerates CPAP well.  He has gained some weight since his last visit.   He reports generalized muscle soreness. His talking to a friend who mentioned that it could be a statin. He was interested in switching to [employer].  Hypertension:  c/o Dizziness in the morning, now resolved  Denies : Chest pain.  Leg edema.  Orthopnea.  Paroxysmal nocturnal dyspnea.  Palpitations.  Shortness of breath.  Dyspnea.  Cough.     Wt Readings from Last 3 Encounters:  04/07/13 260 lb (117.935 kg)  09/03/12 250 lb (113.399 kg)  02/07/11 240 lb (108.863 kg)     Past Medical History  Diagnosis Date  . Renal disorder     kidney stones  . Diabetes mellitus without complication   . Hypertension   . Gout     Current Outpatient Prescriptions  Medication Sig Dispense Refill  . allopurinol (ZYLOPRIM) 300 MG tablet Take 300 mg by mouth daily.        [name] aspirin 81 MG tablet Take 81 mg by mouth daily.        [name] atorvastatin (LIPITOR) 40 MG tablet Take 40 mg by mouth at bedtime.      . B Complex Vitamins (B COMPLEX PO) Take by mouth daily.        . Calcium Carbonate-Vit D-Min (CALTRATE PLUS PO) Take 1 tablet by mouth.      . escitalopram (LEXAPRO) 10 MG tablet Take 10 mg by mouth daily.        . fish oil-omega-3 fatty acids 1000 MG capsule Take 2 g by mouth daily.        [name] lisinopril (PRINIVIL,ZESTRIL) 20 MG tablet Take 20 mg by mouth daily.        . Multiple Vitamin (MULTIVITAMIN PO) Take by mouth daily.        . ondansetron (ZOFRAN ODT) 4 MG disintegrating  tablet 4mg  ODT q4 hours prn nausea/vomit  6 tablet  0  . pioglitazone (ACTOS) 15 MG tablet Take 15 mg by mouth daily.         No current facility-administered medications for this visit.    Allergies:   No Known Allergies  Social History:  The patient  reports that he has never smoked. He has never used smokeless tobacco. He reports that he drinks alcohol. He reports that he does not use illicit drugs.   Family History:  The patient's family history is not on file.   ROS:  Please see the history of present illness.  No nausea, vomiting.  No fevers, chills.  No focal weakness.  No dysuria. Some weight gain.  Muscle soreness all over- he is concerned if it is related to the statin. All other systems reviewed and negative.   PHYSICAL EXAM: VS:  BP 114/80  Pulse 58  Ht 5\' 10"  (1.778 m)  Wt 260 lb (117.935 kg)  BMI 37.31 kg/m2  SpO2 94% Well nourished, well developed, in  no acute distress HEENT: normal Neck: no JVD, no carotid bruits Cardiac:  normal S1, S2; RRR;  Lungs:  clear to auscultation bilaterally, no wheezing, rhonchi or rales Abd: soft, nontender, no hepatomegaly Ext: no edema Skin: warm and dry Neuro:   no focal abnormalities noted  EKG:  NSR , anterior T wave hanges. No change from prior    ASSESSMENT AND PLAN:  Hypertrophic obstructive cardiomyopathy  Diagnostic Imaging:EKG Harward,Amy 03/12/2012 11:49:42 AM > Aleana Fifita,JAY 03/12/2012 12:17:21 PM > NSR, NSST, no significant change from 2011.  No symptoms from this. No lightheadedness when getting of of the elliptical.  Check echo to evaluate for worsening hypertrophy.  Last echo in record seen was from 2005.  Stress test from 2010 showed normal left ventricular ejection fraction and no ischemia.    2. Mixed hyperlipidemia  Stop Lipitor Tablet, 40 mg, 1 tablet, Orally, Once a day for 2 weeks to see if muscle soreness resolves.  If muscle soreness improves, would switch to Crestor. Could also check vitamin D level. He is  started taking coenzyme Q 10. Could increase that to 200 mg daily to make statin is more tolerable. I don't think pravastatin would be potent enough.  Continue Fish Oil Capsule, 1000 MG, 1 tablet, Orally, daily Continue Co Enzyme Q-10 Capsule, 100 MG, 1 tablet, Orally, once a day LDL 60.   3. Essential hypertension, benign  Continue Lisinopril Tablet, 20 MG, take 1 tablet by mouth once daily Controlled.    4. Obstructive sleep apnea (adult) (pediatric)   continue CPAP    Preventive Medicine  Adult topics discussed:  Diet: healthy diet, weight loss.  Exercise: at least 30 minutes of aerobic exercise, 5 days a week, Continue present exercise program.      Signed, Fredric Mare, MD, [facility] 04/07/2013 10:28 AM

## 2013-04-07 NOTE — Patient Instructions (Signed)
Your physician has requested that you have an echocardiogram. Echocardiography is a painless test that uses sound waves to create images of your heart. It provides your doctor with information about the size and shape of your heart and how well your heart's chambers and valves are working. This procedure takes approximately one hour. There are no restrictions for this procedure.  Your physician wants you to follow-up in: 1 year with Dr. Eldridge Dace. You will receive a reminder letter in the mail two months in advance. If you don't receive a letter, please call our office to schedule the follow-up appointment.  Your physician has recommended you make the following change in your medication:   1. Stop lipitor x 2 weeks to see if muscle aches improve. Call to let us know either way.

## 2013-04-18 ENCOUNTER — Ambulatory Visit (HOSPITAL_COMMUNITY): Payer: Medicare Other | Attending: Interventional Cardiology | Admitting: Radiology

## 2013-04-18 ENCOUNTER — Encounter: Payer: Self-pay | Admitting: Cardiology

## 2013-04-18 DIAGNOSIS — E785 Hyperlipidemia, unspecified: Secondary | ICD-10-CM | POA: Insufficient documentation

## 2013-04-18 DIAGNOSIS — I421 Obstructive hypertrophic cardiomyopathy: Secondary | ICD-10-CM | POA: Insufficient documentation

## 2013-04-18 DIAGNOSIS — Z6837 Body mass index (BMI) 37.0-37.9, adult: Secondary | ICD-10-CM | POA: Diagnosis not present

## 2013-04-18 DIAGNOSIS — G4733 Obstructive sleep apnea (adult) (pediatric): Secondary | ICD-10-CM | POA: Insufficient documentation

## 2013-04-18 DIAGNOSIS — I10 Essential (primary) hypertension: Secondary | ICD-10-CM | POA: Diagnosis not present

## 2013-04-18 DIAGNOSIS — E119 Type 2 diabetes mellitus without complications: Secondary | ICD-10-CM | POA: Diagnosis not present

## 2013-04-18 NOTE — Progress Notes (Signed)
Echocardiogram performed.  

## 2013-04-25 ENCOUNTER — Other Ambulatory Visit: Payer: Self-pay | Admitting: Interventional Cardiology

## 2013-06-08 ENCOUNTER — Telehealth: Payer: Self-pay | Admitting: Cardiology

## 2013-06-08 DIAGNOSIS — E782 Mixed hyperlipidemia: Secondary | ICD-10-CM

## 2013-06-08 NOTE — Telephone Encounter (Signed)
For Dr. Irish Lack- -- Per Dr. Clayton Bibles, I have been off my cholesterol med since Dec 11. As a result, my muscle pain/soreness has lessen about half. Recommended future course of action? -- Can you provide me results of the Echocardiogram I had on Dec 22? Thank you and have a Happy New Year! Eddie Zimmerman

## 2013-06-08 NOTE — Telephone Encounter (Signed)
LMTRC

## 2013-06-11 DIAGNOSIS — M722 Plantar fascial fibromatosis: Secondary | ICD-10-CM | POA: Diagnosis not present

## 2013-06-13 NOTE — Telephone Encounter (Signed)
To Ysidro Evert, please advise.

## 2013-06-13 NOTE — Telephone Encounter (Signed)
Stopped atorvastatin 40 mg back in 03/2013 due to generalized muscle weakness.  This has improved somewhat it seems.  He was interested in Venedocia, but I don't think this would be potent enough given LDL goal of < 70 mg/dL.  Atoravastatin 40 mg qd was controlling cholesterol, so would prefer to use Crestor at low dose in him.  Will make sure he is on Co-Enzyme Q-10 at 200 mg daily and have him start Crestor 5 mg daily (low dose).  Recheck lipid panel and hepatic panel 8 weeks later.  If muscle aches return on Crestor 5 mg daily have him call back and we can consider checking a vitamin D level and/or changing to pravastatin (not as potent though).  Please notify patient, change to Crestor 5 mg qd, and set up labs for 8 weeks.  Thanks.

## 2013-06-15 MED ORDER — CO Q10 100 MG PO CAPS: 200.0000 mg | ORAL_CAPSULE | Freq: Every day | ORAL | Status: DC

## 2013-06-15 MED ORDER — ROSUVASTATIN CALCIUM 5 MG PO TABS: 5.0000 mg | ORAL_TABLET | Freq: Every day | ORAL | Status: DC

## 2013-06-15 NOTE — Telephone Encounter (Signed)
Pt notified. Meds updated and labs ordered.  

## 2013-06-16 ENCOUNTER — Telehealth: Payer: Self-pay | Admitting: *Deleted

## 2013-06-16 NOTE — Telephone Encounter (Signed)
PA to [employer] for crestor

## 2013-06-27 ENCOUNTER — Telehealth: Payer: Self-pay | Admitting: Cardiology

## 2013-06-27 NOTE — Telephone Encounter (Signed)
[employer] request further information on PA for pt crestor, done and faxed.

## 2013-06-27 NOTE — Telephone Encounter (Signed)
Hi Zula Hovsepian, I went by [employer] today to pick up the prescription of Crestor you had called in...they had received the prescription but said the insurance company ([employer]are) had not approved the change. They were going to submit request again but suggested I contact you (Dr V) to resubmit the request to the insurance as well. Thanks, Johna Roles

## 2013-06-27 NOTE — Telephone Encounter (Signed)
[employer] faxed me today apologizing but needing more information regarding PA for the crestor. I also sent a note to patient and told him to call me if he needs samples.

## 2013-06-27 NOTE — Telephone Encounter (Signed)
Kim have you seen anything come through on this patient regarding crestor?

## 2013-07-14 NOTE — Telephone Encounter (Signed)
[facility] for [employer] approved crestor tab through 04/27/2098.

## 2013-08-03 DIAGNOSIS — D485 Neoplasm of uncertain behavior of skin: Secondary | ICD-10-CM | POA: Diagnosis not present

## 2013-08-03 DIAGNOSIS — L821 Other seborrheic keratosis: Secondary | ICD-10-CM | POA: Diagnosis not present

## 2013-08-03 DIAGNOSIS — L570 Actinic keratosis: Secondary | ICD-10-CM | POA: Diagnosis not present

## 2013-08-03 DIAGNOSIS — L819 Disorder of pigmentation, unspecified: Secondary | ICD-10-CM | POA: Diagnosis not present

## 2013-08-22 ENCOUNTER — Other Ambulatory Visit: Payer: Medicare Other

## 2013-09-06 ENCOUNTER — Other Ambulatory Visit: Payer: Self-pay

## 2013-09-06 DIAGNOSIS — I10 Essential (primary) hypertension: Secondary | ICD-10-CM | POA: Diagnosis not present

## 2013-09-06 DIAGNOSIS — E782 Mixed hyperlipidemia: Secondary | ICD-10-CM | POA: Diagnosis not present

## 2013-09-06 DIAGNOSIS — Z0000 Encounter for general adult medical examination without abnormal findings: Secondary | ICD-10-CM | POA: Diagnosis not present

## 2013-09-06 DIAGNOSIS — G4730 Sleep apnea, unspecified: Secondary | ICD-10-CM | POA: Diagnosis not present

## 2013-09-06 DIAGNOSIS — Z125 Encounter for screening for malignant neoplasm of prostate: Secondary | ICD-10-CM | POA: Diagnosis not present

## 2013-09-06 DIAGNOSIS — E1149 Type 2 diabetes mellitus with other diabetic neurological complication: Secondary | ICD-10-CM | POA: Diagnosis not present

## 2013-09-06 DIAGNOSIS — M109 Gout, unspecified: Secondary | ICD-10-CM | POA: Diagnosis not present

## 2013-09-06 DIAGNOSIS — E1349 Other specified diabetes mellitus with other diabetic neurological complication: Secondary | ICD-10-CM | POA: Diagnosis not present

## 2013-09-06 MED ORDER — ROSUVASTATIN CALCIUM 5 MG PO TABS: 5.0000 mg | ORAL_TABLET | Freq: Every day | ORAL | Status: DC

## 2013-09-13 DIAGNOSIS — E1139 Type 2 diabetes mellitus with other diabetic ophthalmic complication: Secondary | ICD-10-CM | POA: Diagnosis not present

## 2013-09-13 DIAGNOSIS — E11329 Type 2 diabetes mellitus with mild nonproliferative diabetic retinopathy without macular edema: Secondary | ICD-10-CM | POA: Diagnosis not present

## 2013-10-26 DIAGNOSIS — G4733 Obstructive sleep apnea (adult) (pediatric): Secondary | ICD-10-CM | POA: Diagnosis not present

## 2013-11-03 ENCOUNTER — Encounter (HOSPITAL_COMMUNITY): Payer: Self-pay | Admitting: Emergency Medicine

## 2013-11-03 ENCOUNTER — Emergency Department (HOSPITAL_COMMUNITY)
Admission: EM | Admit: 2013-11-03 | Discharge: 2013-11-03 | Disposition: A | Discharge status: Home / Self Care | Payer: Medicare Other | Attending: Emergency Medicine | Admitting: Emergency Medicine

## 2013-11-03 DIAGNOSIS — Y929 Unspecified place or not applicable: Secondary | ICD-10-CM | POA: Insufficient documentation

## 2013-11-03 DIAGNOSIS — Z7982 Long term (current) use of aspirin: Secondary | ICD-10-CM | POA: Insufficient documentation

## 2013-11-03 DIAGNOSIS — S61209A Unspecified open wound of unspecified finger without damage to nail, initial encounter: Secondary | ICD-10-CM | POA: Insufficient documentation

## 2013-11-03 DIAGNOSIS — I10 Essential (primary) hypertension: Secondary | ICD-10-CM | POA: Diagnosis not present

## 2013-11-03 DIAGNOSIS — E119 Type 2 diabetes mellitus without complications: Secondary | ICD-10-CM | POA: Diagnosis not present

## 2013-11-03 DIAGNOSIS — Z79899 Other long term (current) drug therapy: Secondary | ICD-10-CM | POA: Diagnosis not present

## 2013-11-03 DIAGNOSIS — Y9389 Activity, other specified: Secondary | ICD-10-CM | POA: Insufficient documentation

## 2013-11-03 DIAGNOSIS — S61219A Laceration without foreign body of unspecified finger without damage to nail, initial encounter: Secondary | ICD-10-CM

## 2013-11-03 DIAGNOSIS — W268XXA Contact with other sharp object(s), not elsewhere classified, initial encounter: Secondary | ICD-10-CM | POA: Insufficient documentation

## 2013-11-03 DIAGNOSIS — Z87442 Personal history of urinary calculi: Secondary | ICD-10-CM | POA: Diagnosis not present

## 2013-11-03 DIAGNOSIS — M109 Gout, unspecified: Secondary | ICD-10-CM | POA: Diagnosis not present

## 2013-11-03 MED ORDER — "THROMBI-PAD 3""X3"" EX PADS"
1.0000 | MEDICATED_PAD | Freq: Once | CUTANEOUS | Status: AC
  Administered 2013-11-03: 1 via TOPICAL
  Filled 2013-11-03: qty 1

## 2013-11-03 NOTE — Discharge Instructions (Signed)
Keep wound clean and dry  Keep covered  Apply antibiotic ointment  Return to the emergency department if you develop any changing/worsening condition, drainage, spreading redness/swelling, continued bleeding or any other concerns (please read additional information regarding your condition below)    Laceration Care, Adult A laceration is a cut or lesion that goes through all layers of the skin and into the tissue just beneath the skin. TREATMENT  Some lacerations may not require closure. Some lacerations may not be able to be closed due to an increased risk of infection. It is important to see your caregiver as soon as possible after an injury to minimize the risk of infection and maximize the opportunity for successful closure. If closure is appropriate, pain medicines may be given, if needed. The wound will be cleaned to help prevent infection. Your caregiver will use stitches (sutures), staples, wound glue (adhesive), or skin adhesive strips to repair the laceration. These tools bring the skin edges together to allow for faster healing and a better cosmetic outcome. However, all wounds will heal with a scar. Once the wound has healed, scarring can be minimized by covering the wound with sunscreen during the day for 1 full year. HOME CARE INSTRUCTIONS  For sutures or staples:  Keep the wound clean and dry.  If you were given a bandage (dressing), you should change it at least once a day. Also, change the dressing if it becomes wet or dirty, or as directed by your caregiver.  Wash the wound with soap and water 2 times a day. Rinse the wound off with water to remove all soap. Pat the wound dry with a clean towel.  After cleaning, apply a thin layer of the antibiotic ointment as recommended by your caregiver. This will help prevent infection and keep the dressing from sticking.  You may shower as usual after the first 24 hours. Do not soak the wound in water until the sutures are  removed.  Only take over-the-counter or prescription medicines for pain, discomfort, or fever as directed by your caregiver.  Get your sutures or staples removed as directed by your caregiver. For skin adhesive strips:  Keep the wound clean and dry.  Do not get the skin adhesive strips wet. You may bathe carefully, using caution to keep the wound dry.  If the wound gets wet, pat it dry with a clean towel.  Skin adhesive strips will fall off on their own. You may trim the strips as the wound heals. Do not remove skin adhesive strips that are still stuck to the wound. They will fall off in time. For wound adhesive:  You may briefly wet your wound in the shower or bath. Do not soak or scrub the wound. Do not swim. Avoid periods of heavy perspiration until the skin adhesive has fallen off on its own. After showering or bathing, gently pat the wound dry with a clean towel.  Do not apply liquid medicine, cream medicine, or ointment medicine to your wound while the skin adhesive is in place. This may loosen the film before your wound is healed.  If a dressing is placed over the wound, be careful not to apply tape directly over the skin adhesive. This may cause the adhesive to be pulled off before the wound is healed.  Avoid prolonged exposure to sunlight or tanning lamps while the skin adhesive is in place. Exposure to ultraviolet light in the first year will darken the scar.  The skin adhesive will usually remain in  place for 5 to 10 days, then naturally fall off the skin. Do not pick at the adhesive film. You may need a tetanus shot if:  You cannot remember when you had your last tetanus shot.  You have never had a tetanus shot. If you get a tetanus shot, your arm may swell, get red, and feel warm to the touch. This is common and not a problem. If you need a tetanus shot and you choose not to have one, there is a rare chance of getting tetanus. Sickness from tetanus can be serious. SEEK  MEDICAL CARE IF:   You have redness, swelling, or increasing pain in the wound.  You see a red line that goes away from the wound.  You have yellowish-white fluid (pus) coming from the wound.  You have a fever.  You notice a bad smell coming from the wound or dressing.  Your wound breaks open before or after sutures have been removed.  You notice something coming out of the wound such as wood or glass.  Your wound is on your hand or foot and you cannot move a finger or toe. SEEK IMMEDIATE MEDICAL CARE IF:   Your pain is not controlled with prescribed medicine.  You have severe swelling around the wound causing pain and numbness or a change in color in your arm, hand, leg, or foot.  Your wound splits open and starts bleeding.  You have worsening numbness, weakness, or loss of function of any joint around or beyond the wound.  You develop painful lumps near the wound or on the skin anywhere on your body. MAKE SURE YOU:   Understand these instructions.  Will watch your condition.  Will get help right away if you are not doing well or get worse. Document Released: 04/14/2005 Document Revised: 07/07/2011 Document Reviewed: 10/08/2010 [facility] Patient Information 2015 [place], [place]. This information is not intended to replace advice given to you by your health care provider. Make sure you discuss any questions you have with your health care provider.

## 2013-11-03 NOTE — ED Provider Notes (Signed)
CSN: [id number]     Arrival date & time 11/03/13  1436 History  This chart was scribed for non-physician practitioner Vernie Murders, working with Hoy Morn, MD by Donato Schultz, ED Scribe. This patient was seen in room WTR5/WTR5 and the patient's care was started at 5:57 PM.     Chief Complaint  Patient presents with  . Extremity Laceration    The history is provided by the patient. No language interpreter was used.   HPI Comments: Eddie Zimmerman is a 68 y.o. male with a history of DM and hypertension who presents to the Emergency Department complaining of a laceration to the end of his left thumb while cutting a tomato this afternoon at noon.  No other wounds or concerns.  The patient states that he went to urgent care at 2 PM and was told to report to the ED because physicians were unable to control the bleeding.  The patient states that he did not clean the wound or take medications to treat his pain.  He states that he only takes a baby aspirin daily.  He states that his TDAP is UTD.  He denies having a history of abnormal bleeding.  The patient denies having a history of other medical problems.     Past Medical History  Diagnosis Date  . Renal disorder     kidney stones  . Diabetes mellitus without complication   . Hypertension   . Gout    Past Surgical History  Procedure Laterality Date  . Laparoscopic gastric banding    . Knee surgery    . Hernia repair     No family history on file. History  Substance Use Topics  . Smoking status: Never Smoker   . Smokeless tobacco: Never Used  . Alcohol Use: Yes     Comment: rare    Review of Systems  Constitutional: Negative for fever, chills, activity change, appetite change and fatigue.  Musculoskeletal: Negative for arthralgias, joint swelling and myalgias.  Skin: Positive for wound. Negative for color change.  Neurological: Negative for weakness and numbness.  Hematological: Does not bruise/bleed easily.  All other  systems reviewed and are negative.   Allergies  Review of patient's allergies indicates no known allergies.  Home Medications   Prior to Admission medications   Medication Sig Start Date End Date Taking? Authorizing Provider  allopurinol (ZYLOPRIM) 300 MG tablet Take 300 mg by mouth daily.      Historical Provider, MD  aspirin 81 MG tablet Take 81 mg by mouth daily.      Historical Provider, MD  B Complex Vitamins (B COMPLEX PO) Take by mouth daily.      Historical Provider, MD  Calcium Carbonate-Vit D-Min (CALTRATE PLUS PO) Take 1 tablet by mouth.    Historical Provider, MD  Coenzyme Q10 (CO Q10) 100 MG CAPS Take 200 mg by mouth daily. 06/15/13   Jettie Booze, MD  escitalopram (LEXAPRO) 10 MG tablet Take 10 mg by mouth daily.      Historical Provider, MD  fish oil-omega-3 fatty acids 1000 MG capsule Take 2 g by mouth daily.      Historical Provider, MD  lisinopril (PRINIVIL,ZESTRIL) 20 MG tablet take 1 tablet by mouth once daily 04/25/13   Jettie Booze, MD  Multiple Vitamin (MULTIVITAMIN PO) Take by mouth daily.      Historical Provider, MD  ondansetron (ZOFRAN ODT) 4 MG disintegrating tablet 4mg  ODT q4 hours prn nausea/vomit 09/03/12   Julianne Rice,  MD  pioglitazone (ACTOS) 15 MG tablet Take 15 mg by mouth daily.      Historical Provider, MD  rosuvastatin (CRESTOR) 5 MG tablet Take 1 tablet (5 mg total) by mouth daily. 09/06/13   Jettie Booze, MD   Triage Vitals: BP 129/80  Pulse 62  Temp(Src) 99.5 F (37.5 C) (Oral)  Resp 16  SpO2 99%  Filed Vitals:   11/03/13 1454 11/03/13 1940  BP: 129/80 122/77  Pulse: 62 78  Temp: 99.5 F (37.5 C)   TempSrc: Oral   Resp: 16 18  SpO2: 99% 100%    Physical Exam  Nursing note and vitals reviewed. Constitutional: He is oriented to person, place, and time. He appears well-developed and well-nourished. No distress.  HENT:  Head: Normocephalic and atraumatic.  Right Ear: External ear normal.  Left Ear: External ear  normal.  Eyes: Conjunctivae are normal. Right eye exhibits no discharge. Left eye exhibits no discharge.  Neck: Normal range of motion. Neck supple.  Cardiovascular: Normal rate.   Radial pulses present and equal bilaterally. Capillary refill <2 seconds on digits of let hand.   Pulmonary/Chest: Effort normal.  Abdominal: Soft.  Musculoskeletal: Normal range of motion. He exhibits no edema and no tenderness.       Hands: ROM intact in the digits of the left hand. No tenderness to the digits throughout on the left hand.   Neurological: He is alert and oriented to person, place, and time.  Gross sensation intact in the digits of the left hand.   Skin: Skin is warm and dry. He is not diaphoretic.  1 cm superficial circular area of avulsed tissue to the distal pulp of the left 1st phalanx. Active bleeding without hemorrhage.     ED Course  Procedures (including critical care time)  DIAGNOSTIC STUDIES: Oxygen Saturation is 99% on room air, normal by my interpretation.    COORDINATION OF CARE: 5:59 PM-  Discussed applying a pressure dressing and a tunicate to the patient's left thumb.  The patient agreed to the treatment plan.   Labs Review Labs Reviewed - No data to display  Imaging Review No results found.   EKG Interpretation None      MDM   Eddie Zimmerman is a 68 y.o. male with a history of DM and hypertension who presents to the Emergency Department complaining of a laceration to the end of his left thumb while cutting a tomato this afternoon at noon. Bleeding controlled with direct pressure and thrombi-pad. Tetanus up to date. Wound cleaned in the ED. No lacerations to repair. Patient neurovascularly intact. Wound care discussed with patient. Return precautions, discharge instructions, and follow-up was discussed with the patient before discharge.    Rechecks  7:30 PM = Bleeding controlled after direct pressure and thrombi-pad application.    Discharge Medication List as  of 11/03/2013  7:27 PM      Final impressions: 1. Laceration of finger, left, initial encounter      Mercy Moore PA-C   This patient was discussed with Dr. Doy Mince who evaluated this patient   I personally performed the services described in this documentation, which was scribed in my presence. The recorded information has been reviewed and is accurate.         Lucila Maine, PA-C 11/04/13 [phone]

## 2013-11-03 NOTE — ED Notes (Signed)
Pt states that he sliced the end of his thumb off while cutting a tomato.  Pt states that he went to urgent care and they told him to come here because it wouldn't stop bleeding.  Takes baby aspirin daily.

## 2013-11-06 NOTE — ED Provider Notes (Signed)
Medical screening examination/treatment/procedure(s) were conducted as a shared visit with non-physician practitioner(s) and myself.  I personally evaluated the patient during the encounter.   EKG Interpretation None      68 yo male who cut his left thumb while cutting tomatoes several hours prior to arrival.  On exam, well appearing, nontoxic, not distressed, normal respiratory effort, normal perfusion, laceration through very distal tip of left thumb, bleeding controlled at time of my interview.  Bleeding controlled, plan DC.  Clinical Impression: 1. Laceration of finger, left, initial encounter       Houston Siren III, MD 11/06/13 2025

## 2013-11-06 NOTE — ED Provider Notes (Signed)
Medical screening examination/treatment/procedure(s) were conducted as a shared visit with non-physician practitioner(s) and myself.  I personally evaluated the patient during the encounter.   Please see my separate note.     Houston Siren III, MD 11/06/13 2026

## 2013-12-07 DIAGNOSIS — H00029 Hordeolum internum unspecified eye, unspecified eyelid: Secondary | ICD-10-CM | POA: Diagnosis not present

## 2013-12-07 DIAGNOSIS — H02059 Trichiasis without entropian unspecified eye, unspecified eyelid: Secondary | ICD-10-CM | POA: Diagnosis not present

## 2013-12-11 DIAGNOSIS — Z23 Encounter for immunization: Secondary | ICD-10-CM | POA: Diagnosis not present

## 2013-12-15 DIAGNOSIS — M1710 Unilateral primary osteoarthritis, unspecified knee: Secondary | ICD-10-CM | POA: Diagnosis not present

## 2013-12-22 DIAGNOSIS — M1710 Unilateral primary osteoarthritis, unspecified knee: Secondary | ICD-10-CM | POA: Diagnosis not present

## 2013-12-29 DIAGNOSIS — N200 Calculus of kidney: Secondary | ICD-10-CM | POA: Diagnosis not present

## 2013-12-29 DIAGNOSIS — M1710 Unilateral primary osteoarthritis, unspecified knee: Secondary | ICD-10-CM | POA: Diagnosis not present

## 2014-01-04 DIAGNOSIS — N200 Calculus of kidney: Secondary | ICD-10-CM | POA: Diagnosis not present

## 2014-01-05 DIAGNOSIS — M1710 Unilateral primary osteoarthritis, unspecified knee: Secondary | ICD-10-CM | POA: Diagnosis not present

## 2014-01-12 DIAGNOSIS — M1710 Unilateral primary osteoarthritis, unspecified knee: Secondary | ICD-10-CM | POA: Diagnosis not present

## 2014-01-23 DIAGNOSIS — E1149 Type 2 diabetes mellitus with other diabetic neurological complication: Secondary | ICD-10-CM | POA: Diagnosis not present

## 2014-01-23 DIAGNOSIS — E1349 Other specified diabetes mellitus with other diabetic neurological complication: Secondary | ICD-10-CM | POA: Diagnosis not present

## 2014-01-23 DIAGNOSIS — M109 Gout, unspecified: Secondary | ICD-10-CM | POA: Diagnosis not present

## 2014-01-23 DIAGNOSIS — I10 Essential (primary) hypertension: Secondary | ICD-10-CM | POA: Diagnosis not present

## 2014-01-23 DIAGNOSIS — E7800 Pure hypercholesterolemia, unspecified: Secondary | ICD-10-CM | POA: Diagnosis not present

## 2014-02-10 ENCOUNTER — Other Ambulatory Visit: Payer: Self-pay

## 2014-02-12 ENCOUNTER — Other Ambulatory Visit: Payer: Self-pay | Admitting: Interventional Cardiology

## 2014-02-15 ENCOUNTER — Other Ambulatory Visit: Payer: Self-pay | Admitting: Gastroenterology

## 2014-03-06 ENCOUNTER — Encounter (HOSPITAL_COMMUNITY): Payer: Self-pay | Admitting: *Deleted

## 2014-03-07 ENCOUNTER — Other Ambulatory Visit: Payer: Self-pay | Admitting: Gastroenterology

## 2014-03-27 DIAGNOSIS — M1711 Unilateral primary osteoarthritis, right knee: Secondary | ICD-10-CM | POA: Diagnosis not present

## 2014-04-10 ENCOUNTER — Ambulatory Visit (INDEPENDENT_AMBULATORY_CARE_PROVIDER_SITE_OTHER): Payer: Medicare Other | Admitting: Interventional Cardiology

## 2014-04-10 ENCOUNTER — Encounter: Payer: Self-pay | Admitting: Interventional Cardiology

## 2014-04-10 VITALS — BP 126/80 | HR 56 | Ht 70.0 in | Wt 260.0 lb

## 2014-04-10 DIAGNOSIS — G4733 Obstructive sleep apnea (adult) (pediatric): Secondary | ICD-10-CM | POA: Diagnosis not present

## 2014-04-10 DIAGNOSIS — I421 Obstructive hypertrophic cardiomyopathy: Secondary | ICD-10-CM | POA: Diagnosis not present

## 2014-04-10 DIAGNOSIS — E782 Mixed hyperlipidemia: Secondary | ICD-10-CM | POA: Diagnosis not present

## 2014-04-10 DIAGNOSIS — I10 Essential (primary) hypertension: Secondary | ICD-10-CM

## 2014-04-10 DIAGNOSIS — Z01810 Encounter for preprocedural cardiovascular examination: Secondary | ICD-10-CM

## 2014-04-10 LAB — COMPREHENSIVE METABOLIC PANEL
ALBUMIN: 4.4 g/dL (ref 3.5–5.2)
ALT: 27 U/L (ref 0–53)
AST: 27 U/L (ref 0–37)
Alkaline Phosphatase: 56 U/L (ref 39–117)
BUN: 13 mg/dL (ref 6–23)
CALCIUM: 9.5 mg/dL (ref 8.4–10.5)
CHLORIDE: 108 meq/L (ref 96–112)
CO2: 27 mEq/L (ref 19–32)
CREATININE: 0.9 mg/dL (ref 0.4–1.5)
GFR: 87.45 mL/min (ref >60.00)
GLUCOSE: 120 mg/dL — AB (ref 70–99)
POTASSIUM: 4.1 meq/L (ref 3.5–5.1)
Sodium: 141 mEq/L (ref 135–145)
Total Bilirubin: 0.5 mg/dL (ref 0.2–1.2)
Total Protein: 7.3 g/dL (ref 6.0–8.3)

## 2014-04-10 LAB — LIPID PANEL
CHOLESTEROL: 143 mg/dL (ref 0–200)
HDL: 34.9 mg/dL — AB (ref >39.00)
LDL Cholesterol: 82 mg/dL (ref 0–99)
NonHDL: 108.1
TRIGLYCERIDES: 130 mg/dL (ref 0.0–149.0)
Total CHOL/HDL Ratio: 4
VLDL: 26 mg/dL (ref 0.0–40.0)

## 2014-04-10 NOTE — Patient Instructions (Signed)
Your physician recommends that you continue on your current medications as directed. Please refer to the Current Medication list given to you today.  Your physician recommends that you have for lab work today. Fasting lipid panel and CMET  Your physician wants you to follow-up in: 1 year with Dr. Irish Lack. You will receive a reminder letter in the mail two months in advance. If you don't receive a letter, please call our office to schedule the follow-up appointment.

## 2014-04-10 NOTE — Progress Notes (Signed)
Patient ID: Eddie Zimmerman, male   DOB: [birth year], 68 y.o.   MRN: [id number]    [place], [place], [place]  [address] Phone: [phone] Fax:  [phone]  Date:  04/10/2014   ID:  Eddie Zimmerman, DOB 1945/05/16, MRN [id number]  PCP:  Kandice Hams, MD      History of Present Illness: Eddie Zimmerman is a 68 y.o. male who has OSA. He had lap band surgery in the past. He walks some. He uses the elliptical and gets his HR up to 120-130.  TOlerates CPAP well.  He has gained some weight since his last visit.   He reports generalized muscle soreness. His talking to a friend who mentioned that it could be a statin. He was interested in switching to [employer].  Hypertension:  c/o Dizziness in the morning, now resolved  Denies : Chest pain.  Leg edema.  Orthopnea.  Paroxysmal nocturnal dyspnea.  Palpitations.  Shortness of breath.  Dyspnea.  Cough.   Had trauma to his thumb and went to ER.  He had a left knee replacement about 8 years ago.  He will be having a right knee replacement with Dr. Mayer Camel.  He has gained weight in the last few months due to his knee.  He had a stress test in 2010.  He had an echo in 2014 which showed LVH and normal LV function.  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  No chest pain.  He has shoveled sand recently without problems.   Wt Readings from Last 3 Encounters:  04/10/14 260 lb (117.935 kg)  04/07/13 260 lb (117.935 kg)  09/03/12 250 lb (113.399 kg)     Past Medical History  Diagnosis Date  . Diabetes mellitus without complication   . Hypertension   . Gout   . History of kidney stones   . Sleep apnea     cpap use    Current Outpatient Prescriptions  Medication Sig Dispense Refill  . allopurinol (ZYLOPRIM) 300 MG tablet Take 300 mg by mouth daily at 12 noon.     [name] aspirin 81 MG tablet Take 81 mg by mouth at bedtime.     . B Complex Vitamins (B COMPLEX PO) Take 1 tablet  by mouth daily at 12 noon.     . Coenzyme Q10 (CO Q10) 100 MG CAPS Take 200 mg by mouth daily. (Patient taking differently: Take 200 mg by mouth daily at 12 noon. ) 30 each   . escitalopram (LEXAPRO) 10 MG tablet Take 10 mg by mouth daily at 12 noon.     . fish oil-omega-3 fatty acids 1000 MG capsule Take 2 g by mouth daily at 12 noon.     . gabapentin (NEURONTIN) 600 MG tablet Take 600 mg by mouth 3 (three) times daily.  0  . HYDROcodone-acetaminophen (NORCO/VICODIN) 5-325 MG per tablet as needed.  0  . lisinopril (PRINIVIL,ZESTRIL) 20 MG tablet take 1 tablet by mouth once daily (Patient taking differently: take 1 tablet by mouth once daily at noon) 60 tablet 5  . Multiple Vitamin (MULTIVITAMIN PO) Take 1 tablet by mouth daily at 12 noon.     . pioglitazone (ACTOS) 15 MG tablet Take 15 mg by mouth daily at 12 noon.     . rosuvastatin (CRESTOR) 5 MG tablet Take 5 mg by mouth daily at 12 noon.     No current facility-administered  medications for this visit.    Allergies:   No Known Allergies  Social History:  The patient  reports that he has never smoked. He has never used smokeless tobacco. He reports that he drinks alcohol. He reports that he does not use illicit drugs.   Family History:  The patient's family history includes AAA (abdominal aortic aneurysm) in his brother and mother; Alzheimer's disease in his father; Cirrhosis in his brother.   ROS:  Please see the history of present illness.  No nausea, vomiting.  No fevers, chills.  No focal weakness.  No dysuria. Some weight gain.  Muscle soreness all over- he is concerned if it is related to the statin. All other systems reviewed and negative.   PHYSICAL EXAM: VS:  BP 126/80 mmHg  Pulse 56  Ht 5\' 10"  (1.778 m)  Wt 260 lb (117.935 kg)  BMI 37.31 kg/m2 Well nourished, well developed, in no acute distress HEENT: normal Neck: no JVD, no carotid bruits Cardiac:  normal S1, S2; RRR;  Lungs:  clear to auscultation bilaterally, no  wheezing, rhonchi or rales Abd: soft, nontender, no hepatomegaly Ext: no edema Skin: warm and dry Neuro:   no focal abnormalities noted Psych: normal affect  EKG:  NSR , anterior T wave hanges. No change from prior    ASSESSMENT AND PLAN:  Hypertrophic obstructive cardiomyopathy / preoperative clearance  No symptoms from this. No lightheadedness when getting of of the elliptical.  Checked echo in 2014 showing hypertrophy.  No Chest pain.  Activity is limited by knee pain.  Stress test from 2010 showed normal left ventricular ejection fraction and no ischemia.  Consider repeat stress testing if he has any cardiac symptoms.  He has not had any cardiac sx with 20 minutes on the elliptical or shoveling snow as noted above.  Surgery will likely be at the end of March, unless it could be done sooner.  Avoid excessive IV fluid administration to avoid CHF sx.    2. Mixed hyperlipidemia  Stopped Lipitor Tablet, 40 mg, 1 tablet, Orally, due to muscle soreness.  Muscle soreness improved with Crestor. Coenzyme Q increased to 200 mg daily to make statin is more tolerable.  Continue Fish Oil Capsule, 1000 MG, 1 tablet, Orally, daily Continue Co Enzyme Q-10 Capsule, 200 MG, 1 tablet, Orally, once a day LDL 60.  Check lipids today as he is fasting.     3. Essential hypertension, benign  Continue Lisinopril Tablet, 20 MG, take 1 tablet by mouth once daily Controlled.    4. Obstructive sleep apnea (adult)   continue CPAP    Preventive Medicine  Adult topics discussed:  Diet: healthy diet, weight loss.  Exercise: at least 30 minutes of aerobic exercise, 5 days a week, Continue present exercise program and increase as knee pain allows.      Signed, Mina Marble, MD, [facility] 04/10/2014 11:22 AM

## 2014-04-11 DIAGNOSIS — M1711 Unilateral primary osteoarthritis, right knee: Secondary | ICD-10-CM | POA: Diagnosis not present

## 2014-04-13 ENCOUNTER — Other Ambulatory Visit: Payer: Self-pay

## 2014-04-13 ENCOUNTER — Telehealth: Payer: Self-pay | Admitting: Interventional Cardiology

## 2014-04-13 DIAGNOSIS — E782 Mixed hyperlipidemia: Secondary | ICD-10-CM

## 2014-04-13 DIAGNOSIS — I421 Obstructive hypertrophic cardiomyopathy: Secondary | ICD-10-CM

## 2014-04-13 NOTE — Telephone Encounter (Signed)
New Message        Patient is returning call that was placed to him. Please call patient and she says if you can't get him leave a message please . Thanks

## 2014-04-13 NOTE — Telephone Encounter (Signed)
Called patient back and gave results of labs.

## 2014-05-12 ENCOUNTER — Other Ambulatory Visit: Payer: Self-pay | Admitting: Orthopedic Surgery

## 2014-05-26 NOTE — Pre-Procedure Instructions (Signed)
MUHAMED LUECKE  05/26/2014   Your procedure is scheduled on:  Wednesday, February 10th  Report to [facility] at 1045 AM.  Call this number if you have problems the morning of surgery: [phone]   Remember:   Do not eat food or drink liquids after midnight.   Take these medicines the morning of surgery with A SIP OF WATER: Neurontin, hydrocodone if needed  Stop taking aspirin, OTC vitamins/herbal medications, NSAIDS (ibuprofen, advil, motrin) 7 days prior to surgery.    Do not wear jewelry  Do not wear lotions, powders, or perfume,deodorant.  Do not shave 48 hours prior to surgery. Men may shave face and neck.  Do not bring valuables to the hospital.  [facility] is not responsible  for any belongings or valuables.               Contacts, dentures or bridgework may not be worn into surgery.  Leave suitcase in the car. After surgery it may be brought to your room.  For patients admitted to the hospital, discharge time is determined by your treatment team.          Please read over the following fact sheets that you were given: Pain Booklet, Coughing and Deep Breathing, Blood Transfusion Information, MRSA Information and Surgical Site Infection Prevention  [facility] - Preparing for Surgery  Before surgery, you can play an important role.  Because skin is not sterile, your skin needs to be as free of germs as possible.  You can reduce the number of germs on you skin by washing with CHG (chlorahexidine gluconate) soap before surgery.  CHG is an antiseptic cleaner which kills germs and bonds with the skin to continue killing germs even after washing.  Please DO NOT use if you have an allergy to CHG or antibacterial soaps.  If your skin becomes reddened/irritated stop using the CHG and inform your nurse when you arrive at Short Stay.  Do not shave (including legs and underarms) for at least 48 hours prior to the first CHG shower.  You may shave your face.  Please  follow these instructions carefully:   1.  Shower with CHG Soap the night before surgery and the morning of Surgery.  2.  If you choose to wash your hair, wash your hair first as usual with your normal shampoo.  3.  After you shampoo, rinse your hair and body thoroughly to remove the shampoo.  4.  Use CHG as you would any other liquid soap.  You can apply CHG directly to the skin and wash gently with scrungie or a clean washcloth.  5.  Apply the CHG Soap to your body ONLY FROM THE NECK DOWN.  Do not use on open wounds or open sores.  Avoid contact with your eyes, ears, mouth and genitals (private parts).  Wash genitals (private parts) with your normal soap.  6.  Wash thoroughly, paying special attention to the area where your surgery will be performed.  7.  Thoroughly rinse your body with warm water from the neck down.  8.  DO NOT shower/wash with your normal soap after using and rinsing off the CHG Soap.  9.  Pat yourself dry with a clean towel.            10.  Wear clean pajamas.            11.  Place clean sheets on your bed the night of your first shower and do  not sleep with pets.  Day of Surgery  Do not apply any lotions/deoderants the morning of surgery.  Please wear clean clothes to the hospital/surgery center.

## 2014-05-29 ENCOUNTER — Encounter (HOSPITAL_COMMUNITY)
Admission: RE | Admit: 2014-05-29 | Discharge: 2014-05-29 | Disposition: A | Discharge status: Home / Self Care | Payer: Medicare Other | Source: Ambulatory Visit | Attending: Orthopedic Surgery | Admitting: Orthopedic Surgery

## 2014-05-29 ENCOUNTER — Ambulatory Visit (HOSPITAL_COMMUNITY)
Admission: RE | Admit: 2014-05-29 | Discharge: 2014-05-29 | Disposition: A | Discharge status: Home / Self Care | Payer: Medicare Other | Source: Ambulatory Visit | Attending: Orthopedic Surgery | Admitting: Orthopedic Surgery

## 2014-05-29 ENCOUNTER — Encounter (HOSPITAL_COMMUNITY): Payer: Self-pay

## 2014-05-29 DIAGNOSIS — E119 Type 2 diabetes mellitus without complications: Secondary | ICD-10-CM | POA: Diagnosis not present

## 2014-05-29 DIAGNOSIS — I10 Essential (primary) hypertension: Secondary | ICD-10-CM | POA: Diagnosis not present

## 2014-05-29 DIAGNOSIS — M1990 Unspecified osteoarthritis, unspecified site: Secondary | ICD-10-CM | POA: Insufficient documentation

## 2014-05-29 DIAGNOSIS — R2689 Other abnormalities of gait and mobility: Secondary | ICD-10-CM | POA: Diagnosis not present

## 2014-05-29 DIAGNOSIS — E785 Hyperlipidemia, unspecified: Secondary | ICD-10-CM | POA: Diagnosis not present

## 2014-05-29 DIAGNOSIS — Z01812 Encounter for preprocedural laboratory examination: Secondary | ICD-10-CM | POA: Insufficient documentation

## 2014-05-29 DIAGNOSIS — Z01818 Encounter for other preprocedural examination: Secondary | ICD-10-CM | POA: Insufficient documentation

## 2014-05-29 DIAGNOSIS — I421 Obstructive hypertrophic cardiomyopathy: Secondary | ICD-10-CM | POA: Diagnosis not present

## 2014-05-29 DIAGNOSIS — Z0183 Encounter for blood typing: Secondary | ICD-10-CM | POA: Diagnosis not present

## 2014-05-29 DIAGNOSIS — I517 Cardiomegaly: Secondary | ICD-10-CM | POA: Diagnosis not present

## 2014-05-29 DIAGNOSIS — M25561 Pain in right knee: Secondary | ICD-10-CM | POA: Diagnosis not present

## 2014-05-29 DIAGNOSIS — Z79899 Other long term (current) drug therapy: Secondary | ICD-10-CM | POA: Diagnosis not present

## 2014-05-29 HISTORY — DX: Unspecified osteoarthritis, unspecified site: M19.90

## 2014-05-29 HISTORY — DX: Major depressive disorder, single episode, unspecified: F32.9

## 2014-05-29 HISTORY — DX: Depression, unspecified: F32.A

## 2014-05-29 LAB — BASIC METABOLIC PANEL
ANION GAP: 4 — AB (ref 5–15)
BUN: 14 mg/dL (ref 6–23)
CHLORIDE: 106 mmol/L (ref 96–112)
CO2: 29 mmol/L (ref 19–32)
CREATININE: 0.96 mg/dL (ref 0.50–1.35)
Calcium: 9.9 mg/dL (ref 8.4–10.5)
GFR calc Af Amer: >90 mL/min (ref >90)
GFR calc non Af Amer: 84 mL/min — ABNORMAL LOW (ref >90)
Glucose, Bld: 146 mg/dL — ABNORMAL HIGH (ref 70–99)
Potassium: 4.4 mmol/L (ref 3.5–5.1)
SODIUM: 139 mmol/L (ref 135–145)

## 2014-05-29 LAB — CBC WITH DIFFERENTIAL/PLATELET
BASOS PCT: 1 % (ref 0–1)
Basophils Absolute: 0.1 10*3/uL (ref 0.0–0.1)
EOS ABS: 0.2 10*3/uL (ref 0.0–0.7)
Eosinophils Relative: 3 % (ref 0–5)
HCT: 46.6 % (ref 39.0–52.0)
Hemoglobin: 15.4 g/dL (ref 13.0–17.0)
Lymphocytes Relative: 24 % (ref 12–46)
Lymphs Abs: 1.8 10*3/uL (ref 0.7–4.0)
MCH: 30.6 pg (ref 26.0–34.0)
MCHC: 33 g/dL (ref 30.0–36.0)
MCV: 92.5 fL (ref 78.0–100.0)
MONO ABS: 0.6 10*3/uL (ref 0.1–1.0)
MONOS PCT: 8 % (ref 3–12)
Neutro Abs: 4.8 10*3/uL (ref 1.7–7.7)
Neutrophils Relative %: 64 % (ref 43–77)
Platelets: 188 10*3/uL (ref 150–400)
RBC: 5.04 MIL/uL (ref 4.22–5.81)
RDW: 13.5 % (ref 11.5–15.5)
WBC: 7.4 10*3/uL (ref 4.0–10.5)

## 2014-05-29 LAB — GLUCOSE, CAPILLARY: Glucose-Capillary: 145 mg/dL — ABNORMAL HIGH (ref 70–99)

## 2014-05-29 LAB — PROTIME-INR
INR: 0.96 (ref 0.00–1.49)
Prothrombin Time: 12.9 seconds (ref 11.6–15.2)

## 2014-05-29 LAB — TYPE AND SCREEN
ABO/RH(D): A POS
Antibody Screen: NEGATIVE

## 2014-05-29 LAB — ABO/RH: ABO/RH(D): A POS

## 2014-05-29 LAB — SURGICAL PCR SCREEN
MRSA, PCR: NEGATIVE
Staphylococcus aureus: POSITIVE — AB

## 2014-05-29 LAB — APTT: APTT: 33 s (ref 24–37)

## 2014-05-30 LAB — HEMOGLOBIN A1C
HEMOGLOBIN A1C: 7.2 % — AB (ref 4.8–5.6)
MEAN PLASMA GLUCOSE: 160 mg/dL

## 2014-05-30 NOTE — Progress Notes (Addendum)
Anesthesia Chart Review:  Pt is 69 year old male scheduled for R total knee arthroplasty on 06/07/2014 with Dr. Mayer Camel.   Cardiologist is Dr. Irish Lack, last seen 03/2014.   PMH includes: hypertrophic obstructive cardiomyopathy, HTN, DM, OSA. BMI 39  Preoperative labs reviewed.    Chest x-ray reviewed. Mild lingular infiltrate cannot be excluded. Stable cardiomegaly. Kathryne Hitch in Dr. Damita Dunnings office to report. Juliann Pulse called back after speaking with Dr. Mayer Camel to report he is not concerned.   EKG: Sinus bradycardia (56 bpm) with 1st degree AV block. T wave abnormality, consider anterior ischemia. Per Dr. Hassell Done interpretation, no change from prior.   Echo 04/18/2013:  Left ventricle: The cavity size was normal. There was moderate focal basal and mild concentric hypertrophy. Systolic function was normal. The estimated ejection fraction was in the range of 55% to 60%. Wall motion was normal; there were no regional wall motion abnormalities. There was an increased relative contribution of atrial contraction to ventricular filling. Doppler parameters are consistent with abnormal left ventricular relaxation (grade 1 diastolic dysfunction).  Nuclear stress test 01/09/2009: -The post stress LV is normal in size. The observed defect is consistent with diaphragmatic attenuation. Mildly elevated TID ratio which I do not think is of any significance given the excellent exercise tolerance and lack of perfusion defect.  -the post-stress LV function is normal. The post-stress EF is 54%.  -exercise capacity 11.0 METS -This was essentially a low risk scan. Normal myocardial perfusion scan demonstrating an attenuation artifact in the inferior region of the myocardium. No ischemia of infarct/scar is see in the remaining myocardium.   Saw Dr.Varanasi for preoperative clearance on 04/10/2014.  No further testing ordered. "Avoid excessive IV fluid administration to avoid CHF sx."   Willeen Cass, FNP-BC [facility]  Short Stay Surgical Center/Anesthesiology Phone: [phone] 05/30/2014 4:45 PM  Addendum:   Have called and left messages for pt x2 to clinically correlate any symptoms with CXR findings. Pt has yet to return my call. If he does not call back, pt will have to be assessed for chest sx DOS.  Willeen Cass, FNP-BC [facility] Short Stay Surgical Center/Anesthesiology Phone: [phone] 06/01/2014 4:43 PM

## 2014-06-06 MED ORDER — DEXTROSE-NACL 5-0.45 % IV SOLN: INTRAVENOUS | Status: DC

## 2014-06-06 MED ORDER — CEFAZOLIN SODIUM-DEXTROSE 2-3 GM-% IV SOLR
2.0000 g | INTRAVENOUS | Status: AC
  Administered 2014-06-07: 2 g via INTRAVENOUS

## 2014-06-06 NOTE — H&P (Signed)
TOTAL KNEE ADMISSION H&P  Patient is being admitted for right total knee arthroplasty.  Subjective:  Chief Complaint:right knee pain.  HPI: Eddie Zimmerman, 69 y.o. male, has a history of pain and functional disability in the right knee due to arthritis and has failed non-surgical conservative treatments for greater than 12 weeks to includeNSAID's and/or analgesics, corticosteriod injections, viscosupplementation injections, flexibility and strengthening excercises, weight reduction as appropriate and activity modification.  Onset of symptoms was gradual, starting 2 years ago with gradually worsening course since that time. The patient noted no past surgery on the right knee(s).  Patient currently rates pain in the right knee(s) at 10 out of 10 with activity. Patient has night pain, worsening of pain with activity and weight bearing, pain that interferes with activities of daily living, pain with passive range of motion, crepitus and joint swelling.  Patient has evidence of subchondral sclerosis, periarticular osteophytes and joint space narrowing by imaging studies.  There is no active infection.  Patient Active Problem List   Diagnosis Date Noted  . Preoperative cardiovascular examination 04/10/2014  . Hypertrophic obstructive cardiomyopathy 04/07/2013  . Mixed hyperlipidemia 04/07/2013  . Essential hypertension, benign 04/07/2013  . Obstructive sleep apnea 04/07/2013   Past Medical History  Diagnosis Date  . Diabetes mellitus without complication   . Hypertension   . Gout   . History of kidney stones   . Sleep apnea     cpap use  . Anxiety   . Depression   . Chronic kidney disease     renal calculi - treated /w lithotripsy, has also passed many spontaneously  . Arthritis     hands, wrist & Knee     Past Surgical History  Procedure Laterality Date  . Laparoscopic gastric banding    . Knee surgery Left   . Hernia repair      abdominal  . Joint replacement Left   .  Tonsillectomy    . Arthroscopic repair pcl Bilateral     right- x1, left- x3     No prescriptions prior to admission   No Known Allergies  History  Substance Use Topics  . Smoking status: Never Smoker   . Smokeless tobacco: Never Used  . Alcohol Use: Yes     Comment: rare    Family History  Problem Relation Age of Onset  . AAA (abdominal aortic aneurysm) Mother   . Alzheimer's disease Father   . Cirrhosis Brother     of the liver  . AAA (abdominal aortic aneurysm) Brother      Review of Systems  Constitutional: Negative.   HENT: Negative.   Eyes: Negative.   Respiratory: Negative.   Cardiovascular: Negative.   Gastrointestinal: Negative.   Genitourinary: Negative.   Musculoskeletal: Positive for joint pain.  Skin: Negative.   Neurological: Negative.   Endo/Heme/Allergies: Negative.   Psychiatric/Behavioral: Negative.     Objective:  Physical Exam  Constitutional: He is oriented to person, place, and time. He appears well-developed and well-nourished.  HENT:  Head: Normocephalic and atraumatic.  Eyes: Pupils are equal, round, and reactive to light.  Neck: Normal range of motion. Neck supple.  Cardiovascular: Intact distal pulses.   Respiratory: Effort normal.  Musculoskeletal: He exhibits tenderness.  the patient's right knee has a range from 0 to approximately 100.  No instability.  He does have a mild effusion.  No erythema or warmth.  Patient does have tenderness over the medial and mild lateral joint line tenderness.  His calves are  soft and nontender.  He is neurovascularly intact distally.  Patient's left knee has a range from approximately 0-90.  Neurological: He is alert and oriented to person, place, and time.  Skin: Skin is warm and dry.  Psychiatric: He has a normal mood and affect. His behavior is normal. Judgment and thought content normal.    Vital signs in last 24 hours:    Labs:   Estimated body mass index is 37.31 kg/(m^2) as calculated  from the following:   Height as of 04/10/14: 5\' 10"  (1.778 m).   Weight as of 04/10/14: 117.935 kg (260 lb).   Imaging Review Plain radiographs demonstrate early subluxation of the tibia lateral to the femur by about 4 mm.    Assessment/Plan:  End stage arthritis, right knee   The patient history, physical examination, clinical judgment of the provider and imaging studies are consistent with end stage degenerative joint disease of the right knee(s) and total knee arthroplasty is deemed medically necessary. The treatment options including medical management, injection therapy arthroscopy and arthroplasty were discussed at length. The risks and benefits of total knee arthroplasty were presented and reviewed. The risks due to aseptic loosening, infection, stiffness, patella tracking problems, thromboembolic complications and other imponderables were discussed. The patient acknowledged the explanation, agreed to proceed with the plan and consent was signed. Patient is being admitted for inpatient treatment for surgery, pain control, PT, OT, prophylactic antibiotics, VTE prophylaxis, progressive ambulation and ADL's and discharge planning. The patient is planning to be discharged home with home health services

## 2014-06-07 ENCOUNTER — Inpatient Hospital Stay (HOSPITAL_COMMUNITY): Payer: Medicare Other | Admitting: Anesthesiology

## 2014-06-07 ENCOUNTER — Inpatient Hospital Stay (HOSPITAL_COMMUNITY)
Admission: RE | Admit: 2014-06-07 | Discharge: 2014-06-10 | DRG: 469 | Disposition: A | Discharge status: Home Health | Payer: Medicare Other | Source: Ambulatory Visit | Attending: Internal Medicine | Admitting: Internal Medicine

## 2014-06-07 ENCOUNTER — Encounter (HOSPITAL_COMMUNITY): Payer: Self-pay | Admitting: *Deleted

## 2014-06-07 ENCOUNTER — Encounter (HOSPITAL_COMMUNITY)
Admission: RE | Disposition: A | Discharge status: Home Health | Payer: Self-pay | Source: Ambulatory Visit | Attending: Orthopedic Surgery

## 2014-06-07 ENCOUNTER — Inpatient Hospital Stay (HOSPITAL_COMMUNITY): Payer: Medicare Other | Admitting: Emergency Medicine

## 2014-06-07 DIAGNOSIS — G459 Transient cerebral ischemic attack, unspecified: Secondary | ICD-10-CM | POA: Diagnosis not present

## 2014-06-07 DIAGNOSIS — I6523 Occlusion and stenosis of bilateral carotid arteries: Secondary | ICD-10-CM | POA: Diagnosis present

## 2014-06-07 DIAGNOSIS — R479 Unspecified speech disturbances: Secondary | ICD-10-CM | POA: Diagnosis not present

## 2014-06-07 DIAGNOSIS — I421 Obstructive hypertrophic cardiomyopathy: Secondary | ICD-10-CM

## 2014-06-07 DIAGNOSIS — G4733 Obstructive sleep apnea (adult) (pediatric): Secondary | ICD-10-CM | POA: Diagnosis present

## 2014-06-07 DIAGNOSIS — Z6838 Body mass index (BMI) 38.0-38.9, adult: Secondary | ICD-10-CM | POA: Diagnosis not present

## 2014-06-07 DIAGNOSIS — M25561 Pain in right knee: Secondary | ICD-10-CM | POA: Diagnosis not present

## 2014-06-07 DIAGNOSIS — I129 Hypertensive chronic kidney disease with stage 1 through stage 4 chronic kidney disease, or unspecified chronic kidney disease: Secondary | ICD-10-CM | POA: Diagnosis present

## 2014-06-07 DIAGNOSIS — M109 Gout, unspecified: Secondary | ICD-10-CM | POA: Diagnosis present

## 2014-06-07 DIAGNOSIS — M1710 Unilateral primary osteoarthritis, unspecified knee: Secondary | ICD-10-CM | POA: Diagnosis present

## 2014-06-07 DIAGNOSIS — I63412 Cerebral infarction due to embolism of left middle cerebral artery: Secondary | ICD-10-CM | POA: Diagnosis not present

## 2014-06-07 DIAGNOSIS — Z01810 Encounter for preprocedural cardiovascular examination: Secondary | ICD-10-CM

## 2014-06-07 DIAGNOSIS — J9811 Atelectasis: Secondary | ICD-10-CM | POA: Diagnosis not present

## 2014-06-07 DIAGNOSIS — R4701 Aphasia: Secondary | ICD-10-CM | POA: Diagnosis not present

## 2014-06-07 DIAGNOSIS — R4781 Slurred speech: Secondary | ICD-10-CM | POA: Diagnosis not present

## 2014-06-07 DIAGNOSIS — E669 Obesity, unspecified: Secondary | ICD-10-CM | POA: Diagnosis present

## 2014-06-07 DIAGNOSIS — R5082 Postprocedural fever: Secondary | ICD-10-CM | POA: Diagnosis not present

## 2014-06-07 DIAGNOSIS — E782 Mixed hyperlipidemia: Secondary | ICD-10-CM | POA: Diagnosis present

## 2014-06-07 DIAGNOSIS — M179 Osteoarthritis of knee, unspecified: Secondary | ICD-10-CM | POA: Diagnosis not present

## 2014-06-07 DIAGNOSIS — E119 Type 2 diabetes mellitus without complications: Secondary | ICD-10-CM | POA: Diagnosis present

## 2014-06-07 DIAGNOSIS — D62 Acute posthemorrhagic anemia: Secondary | ICD-10-CM | POA: Diagnosis not present

## 2014-06-07 DIAGNOSIS — I63512 Cerebral infarction due to unspecified occlusion or stenosis of left middle cerebral artery: Secondary | ICD-10-CM | POA: Diagnosis not present

## 2014-06-07 DIAGNOSIS — I10 Essential (primary) hypertension: Secondary | ICD-10-CM | POA: Diagnosis not present

## 2014-06-07 DIAGNOSIS — M1711 Unilateral primary osteoarthritis, right knee: Secondary | ICD-10-CM | POA: Diagnosis not present

## 2014-06-07 DIAGNOSIS — I5032 Chronic diastolic (congestive) heart failure: Secondary | ICD-10-CM | POA: Diagnosis present

## 2014-06-07 DIAGNOSIS — R4182 Altered mental status, unspecified: Secondary | ICD-10-CM | POA: Diagnosis not present

## 2014-06-07 DIAGNOSIS — G451 Carotid artery syndrome (hemispheric): Secondary | ICD-10-CM | POA: Diagnosis not present

## 2014-06-07 DIAGNOSIS — I639 Cerebral infarction, unspecified: Secondary | ICD-10-CM

## 2014-06-07 DIAGNOSIS — N189 Chronic kidney disease, unspecified: Secondary | ICD-10-CM | POA: Diagnosis present

## 2014-06-07 DIAGNOSIS — R509 Fever, unspecified: Secondary | ICD-10-CM

## 2014-06-07 DIAGNOSIS — M129 Arthropathy, unspecified: Secondary | ICD-10-CM | POA: Diagnosis not present

## 2014-06-07 HISTORY — DX: Cerebral infarction, unspecified: I63.9

## 2014-06-07 HISTORY — PX: TOTAL KNEE ARTHROPLASTY: SHX125

## 2014-06-07 LAB — GLUCOSE, CAPILLARY
GLUCOSE-CAPILLARY: 113 mg/dL — AB (ref 70–99)
GLUCOSE-CAPILLARY: 101 mg/dL — AB (ref 70–99)
GLUCOSE-CAPILLARY: 129 mg/dL — AB (ref 70–99)
Glucose-Capillary: 96 mg/dL (ref 70–99)
Glucose-Capillary: 84 mg/dL (ref 70–99)

## 2014-06-07 SURGERY — ARTHROPLASTY, KNEE, TOTAL
Anesthesia: Spinal | Site: Knee | Laterality: Right

## 2014-06-07 MED ORDER — GABAPENTIN 600 MG PO TABS
600.0000 mg | ORAL_TABLET | Freq: Three times a day (TID) | ORAL | Status: DC
  Administered 2014-06-07 – 2014-06-10 (×8): 600 mg via ORAL
  Filled 2014-06-07 (×11): qty 1

## 2014-06-07 MED ORDER — TRANEXAMIC ACID 100 MG/ML IV SOLN
1000.0000 mg | INTRAVENOUS | Status: DC | PRN
  Administered 2014-06-07: 1000 mg via INTRAVENOUS

## 2014-06-07 MED ORDER — PNEUMOCOCCAL VAC POLYVALENT 25 MCG/0.5ML IJ INJ
0.5000 mL | INJECTION | INTRAMUSCULAR | Status: AC
  Administered 2014-06-10: 0.5 mL via INTRAMUSCULAR
  Filled 2014-06-07: qty 0.5

## 2014-06-07 MED ORDER — ASPIRIN EC 325 MG PO TBEC: 325.0000 mg | DELAYED_RELEASE_TABLET | Freq: Two times a day (BID) | ORAL | Status: DC

## 2014-06-07 MED ORDER — ALLOPURINOL 100 MG PO TABS
300.0000 mg | ORAL_TABLET | Freq: Every day | ORAL | Status: DC
  Administered 2014-06-07 – 2014-06-10 (×4): 300 mg via ORAL
  Filled 2014-06-07: qty 3
  Filled 2014-06-07: qty 1
  Filled 2014-06-07: qty 3
  Filled 2014-06-07: qty 1

## 2014-06-07 MED ORDER — ROSUVASTATIN CALCIUM 5 MG PO TABS
5.0000 mg | ORAL_TABLET | Freq: Every day | ORAL | Status: DC
  Administered 2014-06-07 – 2014-06-10 (×4): 5 mg via ORAL
  Filled 2014-06-07 (×4): qty 1

## 2014-06-07 MED ORDER — ACETAMINOPHEN 325 MG PO TABS
650.0000 mg | ORAL_TABLET | Freq: Four times a day (QID) | ORAL | Status: DC | PRN
  Administered 2014-06-08: 650 mg via ORAL
  Filled 2014-06-07: qty 2

## 2014-06-07 MED ORDER — PHENOL 1.4 % MT LIQD: 1.0000 | OROMUCOSAL | Status: DC | PRN

## 2014-06-07 MED ORDER — ONDANSETRON HCL 4 MG/2ML IJ SOLN
4.0000 mg | Freq: Four times a day (QID) | INTRAMUSCULAR | Status: DC | PRN
  Filled 2014-06-07: qty 2

## 2014-06-07 MED ORDER — GLYCOPYRROLATE 0.2 MG/ML IJ SOLN
INTRAMUSCULAR | Status: AC
  Filled 2014-06-07: qty 1

## 2014-06-07 MED ORDER — SENNOSIDES-DOCUSATE SODIUM 8.6-50 MG PO TABS: 1.0000 | ORAL_TABLET | Freq: Every evening | ORAL | Status: DC | PRN

## 2014-06-07 MED ORDER — ACETAMINOPHEN 650 MG RE SUPP: 650.0000 mg | Freq: Four times a day (QID) | RECTAL | Status: DC | PRN

## 2014-06-07 MED ORDER — METHOCARBAMOL 1000 MG/10ML IJ SOLN
500.0000 mg | Freq: Four times a day (QID) | INTRAVENOUS | Status: DC | PRN
  Filled 2014-06-07: qty 5

## 2014-06-07 MED ORDER — HYDROMORPHONE HCL 1 MG/ML IJ SOLN: 0.2500 mg | INTRAMUSCULAR | Status: DC | PRN

## 2014-06-07 MED ORDER — METOCLOPRAMIDE HCL 5 MG/ML IJ SOLN: 5.0000 mg | Freq: Three times a day (TID) | INTRAMUSCULAR | Status: DC | PRN

## 2014-06-07 MED ORDER — FLEET ENEMA 7-19 GM/118ML RE ENEM: 1.0000 | ENEMA | Freq: Once | RECTAL | Status: AC | PRN

## 2014-06-07 MED ORDER — DOCUSATE SODIUM 100 MG PO CAPS
100.0000 mg | ORAL_CAPSULE | Freq: Two times a day (BID) | ORAL | Status: DC
  Administered 2014-06-07 – 2014-06-10 (×6): 100 mg via ORAL
  Filled 2014-06-07 (×6): qty 1

## 2014-06-07 MED ORDER — LIDOCAINE HCL (CARDIAC) 20 MG/ML IV SOLN
INTRAVENOUS | Status: AC
  Filled 2014-06-07: qty 5

## 2014-06-07 MED ORDER — PROPOFOL INFUSION 10 MG/ML OPTIME
INTRAVENOUS | Status: DC | PRN
  Administered 2014-06-07: 50 ug/kg/min via INTRAVENOUS

## 2014-06-07 MED ORDER — TRANEXAMIC ACID 100 MG/ML IV SOLN
1000.0000 mg | INTRAVENOUS | Status: DC
  Filled 2014-06-07: qty 10

## 2014-06-07 MED ORDER — NAPROXEN SODIUM 220 MG PO CAPS: 220.0000 mg | ORAL_CAPSULE | Freq: Two times a day (BID) | ORAL | Status: DC | PRN

## 2014-06-07 MED ORDER — INSULIN ASPART 100 UNIT/ML ~~LOC~~ SOLN
0.0000 [IU] | Freq: Three times a day (TID) | SUBCUTANEOUS | Status: DC
  Administered 2014-06-08 (×2): 3 [IU] via SUBCUTANEOUS
  Administered 2014-06-09: 2 [IU] via SUBCUTANEOUS
  Administered 2014-06-09: 3 [IU] via SUBCUTANEOUS
  Administered 2014-06-10 (×2): 2 [IU] via SUBCUTANEOUS

## 2014-06-07 MED ORDER — PIOGLITAZONE HCL 15 MG PO TABS
15.0000 mg | ORAL_TABLET | Freq: Every day | ORAL | Status: DC
  Administered 2014-06-07 – 2014-06-10 (×4): 15 mg via ORAL
  Filled 2014-06-07 (×4): qty 1

## 2014-06-07 MED ORDER — METOCLOPRAMIDE HCL 10 MG PO TABS: 5.0000 mg | ORAL_TABLET | Freq: Three times a day (TID) | ORAL | Status: DC | PRN

## 2014-06-07 MED ORDER — PROPOFOL 10 MG/ML IV BOLUS
INTRAVENOUS | Status: AC
  Filled 2014-06-07: qty 20

## 2014-06-07 MED ORDER — LACTATED RINGERS IV SOLN
INTRAVENOUS | Status: DC
  Administered 2014-06-07 (×2): via INTRAVENOUS

## 2014-06-07 MED ORDER — SODIUM CHLORIDE 0.9 % IR SOLN
Status: DC | PRN
  Administered 2014-06-07: 1000 mL
  Administered 2014-06-07: 3000 mL

## 2014-06-07 MED ORDER — ALUM & MAG HYDROXIDE-SIMETH 200-200-20 MG/5ML PO SUSP: 30.0000 mL | ORAL | Status: DC | PRN

## 2014-06-07 MED ORDER — PROMETHAZINE HCL 25 MG/ML IJ SOLN: 6.2500 mg | INTRAMUSCULAR | Status: DC | PRN

## 2014-06-07 MED ORDER — ARTIFICIAL TEARS OP OINT
TOPICAL_OINTMENT | OPHTHALMIC | Status: AC
  Filled 2014-06-07: qty 3.5

## 2014-06-07 MED ORDER — ASPIRIN EC 325 MG PO TBEC
325.0000 mg | DELAYED_RELEASE_TABLET | Freq: Every day | ORAL | Status: DC
  Administered 2014-06-08 – 2014-06-10 (×3): 325 mg via ORAL
  Filled 2014-06-07 (×4): qty 1

## 2014-06-07 MED ORDER — MENTHOL 3 MG MT LOZG: 1.0000 | LOZENGE | OROMUCOSAL | Status: DC | PRN

## 2014-06-07 MED ORDER — OXYCODONE HCL 5 MG/5ML PO SOLN: 5.0000 mg | Freq: Once | ORAL | Status: DC | PRN

## 2014-06-07 MED ORDER — NAPROXEN 250 MG PO TABS: 250.0000 mg | ORAL_TABLET | Freq: Two times a day (BID) | ORAL | Status: DC | PRN

## 2014-06-07 MED ORDER — BUPIVACAINE IN DEXTROSE 0.75-8.25 % IT SOLN
INTRATHECAL | Status: DC | PRN
  Administered 2014-06-07: 2 mL via INTRATHECAL

## 2014-06-07 MED ORDER — KCL IN DEXTROSE-NACL 20-5-0.45 MEQ/L-%-% IV SOLN
INTRAVENOUS | Status: DC
  Filled 2014-06-07 (×6): qty 1000

## 2014-06-07 MED ORDER — ESCITALOPRAM OXALATE 10 MG PO TABS
10.0000 mg | ORAL_TABLET | Freq: Every day | ORAL | Status: DC
  Administered 2014-06-07 – 2014-06-10 (×4): 10 mg via ORAL
  Filled 2014-06-07 (×4): qty 1

## 2014-06-07 MED ORDER — FENTANYL CITRATE 0.05 MG/ML IJ SOLN
INTRAMUSCULAR | Status: AC
  Filled 2014-06-07: qty 5

## 2014-06-07 MED ORDER — DIPHENHYDRAMINE HCL 12.5 MG/5ML PO ELIX: 12.5000 mg | ORAL_SOLUTION | ORAL | Status: DC | PRN

## 2014-06-07 MED ORDER — METHOCARBAMOL 500 MG PO TABS: 500.0000 mg | ORAL_TABLET | Freq: Two times a day (BID) | ORAL | Status: DC

## 2014-06-07 MED ORDER — OXYCODONE-ACETAMINOPHEN 5-325 MG PO TABS: 1.0000 | ORAL_TABLET | ORAL | Status: DC | PRN

## 2014-06-07 MED ORDER — OXYCODONE HCL 5 MG PO TABS
5.0000 mg | ORAL_TABLET | ORAL | Status: DC | PRN
  Administered 2014-06-07 – 2014-06-08 (×4): 10 mg via ORAL
  Filled 2014-06-07 (×6): qty 2

## 2014-06-07 MED ORDER — BISACODYL 5 MG PO TBEC: 5.0000 mg | DELAYED_RELEASE_TABLET | Freq: Every day | ORAL | Status: DC | PRN

## 2014-06-07 MED ORDER — METHOCARBAMOL 500 MG PO TABS
500.0000 mg | ORAL_TABLET | Freq: Four times a day (QID) | ORAL | Status: DC | PRN
  Administered 2014-06-07 – 2014-06-08 (×3): 500 mg via ORAL
  Filled 2014-06-07 (×3): qty 1

## 2014-06-07 MED ORDER — PROPOFOL 10 MG/ML IV BOLUS
INTRAVENOUS | Status: DC | PRN
  Administered 2014-06-07 (×2): 10 mg via INTRAVENOUS
  Administered 2014-06-07 (×3): 20 mg via INTRAVENOUS

## 2014-06-07 MED ORDER — OXYCODONE HCL 5 MG PO TABS: 5.0000 mg | ORAL_TABLET | Freq: Once | ORAL | Status: DC | PRN

## 2014-06-07 MED ORDER — BUPIVACAINE LIPOSOME 1.3 % IJ SUSP
20.0000 mL | Freq: Once | INTRAMUSCULAR | Status: DC
  Filled 2014-06-07: qty 20

## 2014-06-07 MED ORDER — GLYCOPYRROLATE 0.2 MG/ML IJ SOLN
INTRAMUSCULAR | Status: DC | PRN
  Administered 2014-06-07 (×2): 0.2 mg via INTRAVENOUS

## 2014-06-07 MED ORDER — HYDROMORPHONE HCL 1 MG/ML IJ SOLN
0.5000 mg | INTRAMUSCULAR | Status: DC | PRN
  Administered 2014-06-07 – 2014-06-08 (×4): 1 mg via INTRAVENOUS
  Filled 2014-06-07 (×4): qty 1

## 2014-06-07 MED ORDER — FENTANYL CITRATE 0.05 MG/ML IJ SOLN
INTRAMUSCULAR | Status: DC | PRN
  Administered 2014-06-07: 50 ug via INTRAVENOUS

## 2014-06-07 MED ORDER — LISINOPRIL 20 MG PO TABS
20.0000 mg | ORAL_TABLET | Freq: Every day | ORAL | Status: DC
  Administered 2014-06-07 – 2014-06-10 (×3): 20 mg via ORAL
  Filled 2014-06-07 (×4): qty 1

## 2014-06-07 MED ORDER — SODIUM CHLORIDE 0.9 % IV SOLN
INTRAVENOUS | Status: DC | PRN
  Administered 2014-06-07: 60 mL

## 2014-06-07 MED ORDER — ONDANSETRON HCL 4 MG/2ML IJ SOLN
INTRAMUSCULAR | Status: AC
  Filled 2014-06-07: qty 2

## 2014-06-07 MED ORDER — ONDANSETRON HCL 4 MG PO TABS: 4.0000 mg | ORAL_TABLET | Freq: Four times a day (QID) | ORAL | Status: DC | PRN

## 2014-06-07 SURGICAL SUPPLY — 63 items
BANDAGE ESMARK 6X9 LF (GAUZE/BANDAGES/DRESSINGS) ×1 IMPLANT
BLADE SAG 18X100X1.27 (BLADE) ×3 IMPLANT
BLADE SAW SGTL 13X75X1.27 (BLADE) ×3 IMPLANT
BLADE SURG ROTATE 9660 (MISCELLANEOUS) IMPLANT
BNDG CMPR 9X6 STRL LF SNTH (GAUZE/BANDAGES/DRESSINGS) ×1
BNDG CMPR MED 10X6 ELC LF (GAUZE/BANDAGES/DRESSINGS) ×1
BNDG ELASTIC 6X10 VLCR STRL LF (GAUZE/BANDAGES/DRESSINGS) ×3 IMPLANT
BNDG ESMARK 6X9 LF (GAUZE/BANDAGES/DRESSINGS) ×3
BOWL SMART MIX CTS (DISPOSABLE) ×3 IMPLANT
CAP KNEE TOTAL 3 SIGMA ×2 IMPLANT
CEMENT HV SMART SET (Cement) ×4 IMPLANT
COVER SURGICAL LIGHT HANDLE (MISCELLANEOUS) ×3 IMPLANT
CUFF TOURNIQUET SINGLE 34IN LL (TOURNIQUET CUFF) IMPLANT
CUFF TOURNIQUET SINGLE 44IN (TOURNIQUET CUFF) IMPLANT
DRAPE EXTREMITY T 121X128X90 (DRAPE) ×3 IMPLANT
DRAPE IMP U-DRAPE 54X76 (DRAPES) ×3 IMPLANT
DRAPE U-SHAPE 47X51 STRL (DRAPES) ×3 IMPLANT
DURAPREP 26ML APPLICATOR (WOUND CARE) ×6 IMPLANT
ELECT REM PT RETURN 9FT ADLT (ELECTROSURGICAL) ×3
ELECTRODE REM PT RTRN 9FT ADLT (ELECTROSURGICAL) ×1 IMPLANT
EVACUATOR 1/8 PVC DRAIN (DRAIN) ×3 IMPLANT
GAUZE SPONGE 4X4 12PLY STRL (GAUZE/BANDAGES/DRESSINGS) ×4 IMPLANT
GAUZE XEROFORM 1X8 LF (GAUZE/BANDAGES/DRESSINGS) ×3 IMPLANT
GLOVE BIO SURGEON STRL SZ7.5 (GLOVE) ×3 IMPLANT
GLOVE BIO SURGEON STRL SZ8.5 (GLOVE) ×3 IMPLANT
GLOVE BIOGEL PI IND STRL 8 (GLOVE) ×1 IMPLANT
GLOVE BIOGEL PI IND STRL 9 (GLOVE) ×1 IMPLANT
GLOVE BIOGEL PI INDICATOR 8 (GLOVE) ×2
GLOVE BIOGEL PI INDICATOR 9 (GLOVE) ×2
GOWN STRL REUS W/ TWL LRG LVL3 (GOWN DISPOSABLE) ×1 IMPLANT
GOWN STRL REUS W/ TWL XL LVL3 (GOWN DISPOSABLE) ×2 IMPLANT
GOWN STRL REUS W/TWL LRG LVL3 (GOWN DISPOSABLE) ×3
GOWN STRL REUS W/TWL XL LVL3 (GOWN DISPOSABLE) ×6
HANDPIECE INTERPULSE COAX TIP (DISPOSABLE) ×3
HOOD PEEL AWAY FACE SHEILD DIS (HOOD) ×8 IMPLANT
KIT BASIN OR (CUSTOM PROCEDURE TRAY) ×3 IMPLANT
KIT ROOM TURNOVER OR (KITS) ×3 IMPLANT
MANIFOLD NEPTUNE II (INSTRUMENTS) ×3 IMPLANT
NDL SAFETY ECLIPSE 18X1.5 (NEEDLE) IMPLANT
NDL SPNL 18GX3.5 QUINCKE PK (NEEDLE) IMPLANT
NEEDLE 22X1 1/2 (OR ONLY) (NEEDLE) ×3 IMPLANT
NEEDLE HYPO 18GX1.5 SHARP (NEEDLE)
NEEDLE SPNL 18GX3.5 QUINCKE PK (NEEDLE) IMPLANT
NS IRRIG 1000ML POUR BTL (IV SOLUTION) ×3 IMPLANT
PACK TOTAL JOINT (CUSTOM PROCEDURE TRAY) ×3 IMPLANT
PACK UNIVERSAL I (CUSTOM PROCEDURE TRAY) ×3 IMPLANT
PAD ARMBOARD 7.5X6 YLW CONV (MISCELLANEOUS) ×6 IMPLANT
PADDING CAST COTTON 6X4 STRL (CAST SUPPLIES) ×3 IMPLANT
PINS IMPLANT
SET HNDPC FAN SPRY TIP SCT (DISPOSABLE) ×1 IMPLANT
STAPLER VISISTAT 35W (STAPLE) ×3 IMPLANT
SUCTION FRAZIER TIP 10 FR DISP (SUCTIONS) ×3 IMPLANT
SUT VIC AB 0 CT1 27 (SUTURE) ×3
SUT VIC AB 0 CT1 27XBRD ANBCTR (SUTURE) ×1 IMPLANT
SUT VIC AB 1 CTX 36 (SUTURE) ×3
SUT VIC AB 1 CTX36XBRD ANBCTR (SUTURE) ×1 IMPLANT
SUT VIC AB 2-0 CT1 27 (SUTURE) ×6
SUT VIC AB 2-0 CT1 TAPERPNT 27 (SUTURE) ×1 IMPLANT
SYR 30ML LL (SYRINGE) ×3 IMPLANT
SYR 50ML LL SCALE MARK (SYRINGE) ×3 IMPLANT
TOWEL OR 17X24 6PK STRL BLUE (TOWEL DISPOSABLE) ×3 IMPLANT
TOWEL OR 17X26 10 PK STRL BLUE (TOWEL DISPOSABLE) ×3 IMPLANT
WATER STERILE IRR 1000ML POUR (IV SOLUTION) ×4 IMPLANT

## 2014-06-07 NOTE — Plan of Care (Signed)
Problem: Consults Goal: Diagnosis- Total Joint Replacement Primary Total Knee Right     

## 2014-06-07 NOTE — Progress Notes (Signed)
Utilization review completed.  

## 2014-06-07 NOTE — Interval H&P Note (Signed)
History and Physical Interval Note:  06/07/2014 1:06 PM  Eddie Zimmerman  has presented today for surgery, with the diagnosis of RIGHT KNEE DEGENERATIVE JOINT DISEASE  The various methods of treatment have been discussed with the patient and family. After consideration of risks, benefits and other options for treatment, the patient has consented to  Procedure(s): TOTAL KNEE ARTHROPLASTY (Right) as a surgical intervention .  The patient's history has been reviewed, patient examined, no change in status, stable for surgery.  I have reviewed the patient's chart and labs.  Questions were answered to the patient's satisfaction.     Kerin Salen

## 2014-06-07 NOTE — Progress Notes (Signed)
Pt denies cough, wheezing, chest congestion, fever or worsening shortness of breath.

## 2014-06-07 NOTE — Anesthesia Preprocedure Evaluation (Addendum)
Anesthesia Evaluation  Patient identified by MRN, date of birth, ID band Patient awake    Reviewed: Allergy & Precautions, NPO status , Patient's Chart, lab work & pertinent test results  Airway Mallampati: I  TM Distance: >3 FB Neck ROM: Full    Dental  (+) Teeth Intact, Dental Advisory Given   Pulmonary sleep apnea and Continuous Positive Airway Pressure Ventilation ,  breath sounds clear to auscultation        Cardiovascular hypertension, Pt. on medications Rhythm:Regular Rate:Normal  HOCM. EF 60%. Moderate LVH.   Neuro/Psych negative neurological ROS     GI/Hepatic negative GI ROS, Neg liver ROS,   Endo/Other  diabetes, Type 2Morbid obesity  Renal/GU negative Renal ROS     Musculoskeletal   Abdominal   Peds  Hematology negative hematology ROS (+)   Anesthesia Other Findings   Reproductive/Obstetrics                            Anesthesia Physical Anesthesia Plan  ASA: III  Anesthesia Plan: Spinal   Post-op Pain Management:    Induction: Intravenous  Airway Management Planned: Natural Airway and Simple Face Mask  Additional Equipment:   Intra-op Plan:   Post-operative Plan:   Informed Consent: I have reviewed the patients History and Physical, chart, labs and discussed the procedure including the risks, benefits and alternatives for the proposed anesthesia with the patient or authorized representative who has indicated his/her understanding and acceptance.     Plan Discussed with: CRNA  Anesthesia Plan Comments:        Anesthesia Quick Evaluation

## 2014-06-07 NOTE — Anesthesia Postprocedure Evaluation (Signed)
  Anesthesia Post-op Note  Patient: Eddie Zimmerman  Procedure(s) Performed: Procedure(s): TOTAL KNEE ARTHROPLASTY (Right)  Patient Location: PACU  Anesthesia Type:Spinal  Level of Consciousness: awake  Airway and Oxygen Therapy: Patient Spontanous Breathing  Post-op Pain: mild  Post-op Assessment: Post-op Vital signs reviewed, Patient's Cardiovascular Status Stable, Respiratory Function Stable, Patent Airway, No signs of Nausea or vomiting and Pain level controlled  Post-op Vital Signs: Reviewed and stable  Last Vitals:  Filed Vitals:   06/07/14 1700  BP: 144/67  Pulse: 50  Temp: 36.8 C  Resp: 14    Complications: No apparent anesthesia complications

## 2014-06-07 NOTE — Progress Notes (Signed)
Orthopedic Tech Progress Note Patient Details:  Eddie Zimmerman 08/26/1945 [id number] Off cpm at 7:15 pm Patient ID: CHAE OOMMEN, male   DOB: 22-Mar-1946, 69 y.o.   MRN: [id number]   [name] 06/07/2014, 7:19 PM

## 2014-06-07 NOTE — Progress Notes (Signed)
Orthopedic Tech Progress Note Patient Details:  Eddie Zimmerman 21-Jun-1945 [id number]  CPM Right Knee CPM Right Knee: On Right Knee Flexion (Degrees): 40 Right Knee Extension (Degrees): 0 Additional Comments: trapeze bar patient helper viewed order from doctor's order list  Hildred Priest 06/07/2014, 4:13 PM

## 2014-06-07 NOTE — Transfer of Care (Signed)
Immediate Anesthesia Transfer of Care Note  Patient: Eddie Zimmerman  Procedure(s) Performed: Procedure(s): TOTAL KNEE ARTHROPLASTY (Right)  Patient Location: PACU  Anesthesia Type:Spinal  Level of Consciousness: awake  Airway & Oxygen Therapy: Patient Spontanous Breathing and Patient connected to face mask oxygen  Post-op Assessment: Report given to RN  Post vital signs: Reviewed and stable  Last Vitals:  Filed Vitals:   06/07/14 1036  BP: 155/80  Pulse: 55  Temp: 36.3 C  Resp: 18    Complications: No apparent anesthesia complications

## 2014-06-07 NOTE — Op Note (Signed)
PATIENT ID:      Eddie Zimmerman  MRN:     [id number] DOB/AGE:    67-29-1947 / 69 y.o.       OPERATIVE REPORT    DATE OF PROCEDURE:  06/07/2014       PREOPERATIVE DIAGNOSIS:   RIGHT KNEE DEGENERATIVE JOINT DISEASE      Estimated body mass index is 38.94 kg/(m^2) as calculated from the following:   Height as of this encounter: 5\' 9"  (1.753 m).   Weight as of this encounter: 119.659 kg (263 lb 12.8 oz).                                                        POSTOPERATIVE DIAGNOSIS:   RIGHT KNEE DEGENERATIVE JOINT DISEASE                                                                      PROCEDURE:  Procedure(s): TOTAL KNEE ARTHROPLASTY Using Depuy Sigma RP implants #5R Femur, #6Tibia, 92mm Sigma RP bearing, 41 Patella     SURGEON: Mesiah Manzo,Eligh J    ASSISTANT:   Eric K[employer]   (Present and scrubbed throughout the case, critical for assistance with exposure, retraction, instrumentation, and closure.)         ANESTHESIA: GET, ACB, Exparel  DRAINS: 2 medium hemovac in knee   TOURNIQUET TIME: 95AOZ   COMPLICATIONS:  None     SPECIMENS: None   INDICATIONS FOR PROCEDURE: The patient has  RIGHT KNEE DEGENERATIVE JOINT DISEASE, varus deformities, XR shows bone on bone arthritis. Patient has failed all conservative measures including anti-inflammatory medicines, narcotics, attempts at  exercise and weight loss, cortisone injections and viscosupplementation.  Risks and benefits of surgery have been discussed, questions answered.   DESCRIPTION OF PROCEDURE: The patient identified by armband, received  IV antibiotics, in the holding area at [facility] Patient taken to the operating room, appropriate anesthetic  monitors were attached, and general endotracheal anesthesia induced with  the patient in supine position, Foley catheter was inserted. Tourniquet  applied high to the operative thigh. Lateral post and foot positioner  applied to the table, the lower extremity was then  prepped and draped  in usual sterile fashion from the ankle to the tourniquet. Time-out procedure was performed. The limb was wrapped with an Esmarch bandage and the tourniquet inflated to 350 mmHg. We began the operation by making the anterior midline incision starting at handbreadth above the patella going over the patella 1 cm medial to and  4 cm distal to the tibial tubercle. Small bleeders in the skin and the  subcutaneous tissue identified and cauterized. Transverse retinaculum was incised and reflected medially. We identified numerous collections of calcium urate crystal deposits in the prepatellar bursa and these were removed using sharp Rogers. A medial parapatellar arthrotomy was accomplished. the patella was everted and theprepatellar fat pad resected. The superficial medial collateral  ligament was then elevated from anterior to posterior along the proximal  flare of the tibia and anterior half of the menisci resected. The knee was hyperflexed  exposing bone on bone arthritis. Peripheral and notch osteophytes as well as the cruciate ligaments were then resected. We continued to  work our way around posteriorly along the proximal tibia, and externally  rotated the tibia subluxing it out from underneath the femur. A McHale  retractor was placed through the notch and a lateral Hohmann retractor  placed, and we then drilled through the proximal tibia in line with the  axis of the tibia followed by an intramedullary guide rod and 2-degree  posterior slope cutting guide. The tibial cutting guide was pinned into place  allowing resection of 6 mm of bone medially and about 12 mm of bone  laterally because of her varus deformity. Satisfied with the tibial resection, we then  entered the distal femur 2 mm anterior to the PCL origin with the  intramedullary guide rod and applied the distal femoral cutting guide  set at 21mm, with 5 degrees of valgus. This was pinned along the  epicondylar axis. At  this point, the distal femoral cut was accomplished without difficulty. We then sized for a #5R femoral component and pinned the guide in 3 degrees of external rotation.The chamfer cutting guide was pinned into place. The anterior, posterior, and chamfer cuts were accomplished without difficulty followed by  the box cutting guide and the box cut. We also removed posterior osteophytes from the posterior femoral condyles. At this  time, the knee was brought into full extension. We checked our  extension and flexion gaps and found them symmetric at 73mm.  The patella thickness measured at 28 mm. We set the cutting guide at 18 and removed the posterior 10 mm  of the patella, sized for a 41 button and drilled the lollipop. The knee  was then once again hyperflexed exposing the proximal tibia. We sized for a #6 tibial base plate, applied the smokestack and the conical reamer followed by the the Delta fin keel punch. We then hammered into place the Sigma RP trial femoral component, inserted a 10-mm trial bearing, trial patellar button, and took the knee through range of motion from 0-130 degrees. No thumb pressure was required for patellar  tracking. At this point, all trial components were removed, a double batch of DePuy HV cement with 1500 mg of Zinacef was mixed and applied to all bony metallic mating surfaces except for the posterior condyles of the femur itself. In order, we  hammered into place the tibial tray and removed excess cement, the femoral component and removed excess cement, a 10-mm Sigma RP bearing  was inserted, and the knee brought to full extension with compression.  The patellar button was clamped into place, and excess cement  removed. While the cement cured the wound was irrigated out with normal saline solution pulse lavage, and medium Hemovac drains were placed from an anterolateral  approach. Ligament stability and patellar tracking were checked and found to be excellent. The  parapatellar arthrotomy was closed with  running #1 Vicryl suture. The subcutaneous tissue with 0 and 2-0 undyed  Vicryl suture, and the skin with skin staples. A dressing of Xeroform,  4 x 4, dressing sponges, Webril, and Ace wrap applied. The patient  awakened, extubated, and taken to recovery room without difficulty.   Sharlena Kristensen,Fischer J 06/07/2014, 2:50 PM

## 2014-06-07 NOTE — Anesthesia Procedure Notes (Addendum)
Spinal Patient location during procedure: OR Staffing Anesthesiologist: Suzette Battiest E Performed by: anesthesiologist  Preanesthetic Checklist Completed: patient identified, site marked, surgical consent, pre-op evaluation, timeout performed, IV checked, risks and benefits discussed and monitors and equipment checked Spinal Block Patient position: sitting Prep: Betadine Patient monitoring: heart rate, continuous pulse ox and blood pressure Location: L4-5 Injection technique: single-shot Needle Needle type: Whitacre  Needle gauge: 24 G Needle length: 9 cm Additional Notes Expiration date of kit checked and confirmed. Patient tolerated procedure well, without complications.    Procedure Name: MAC Date/Time: 06/07/2014 1:17 PM Performed by: Barrington Ellison Pre-anesthesia Checklist: Patient identified, Emergency Drugs available, Suction available, Patient being monitored and Timeout performed Patient Re-evaluated:Patient Re-evaluated prior to inductionOxygen Delivery Method: Simple face mask Preoxygenation: Pre-oxygenation with 100% oxygen

## 2014-06-08 ENCOUNTER — Inpatient Hospital Stay (HOSPITAL_COMMUNITY): Payer: Medicare Other

## 2014-06-08 ENCOUNTER — Other Ambulatory Visit: Payer: Self-pay

## 2014-06-08 DIAGNOSIS — I5032 Chronic diastolic (congestive) heart failure: Secondary | ICD-10-CM

## 2014-06-08 DIAGNOSIS — I10 Essential (primary) hypertension: Secondary | ICD-10-CM | POA: Diagnosis present

## 2014-06-08 DIAGNOSIS — G459 Transient cerebral ischemic attack, unspecified: Secondary | ICD-10-CM

## 2014-06-08 DIAGNOSIS — R4781 Slurred speech: Secondary | ICD-10-CM

## 2014-06-08 DIAGNOSIS — M1711 Unilateral primary osteoarthritis, right knee: Principal | ICD-10-CM

## 2014-06-08 DIAGNOSIS — E119 Type 2 diabetes mellitus without complications: Secondary | ICD-10-CM

## 2014-06-08 DIAGNOSIS — G451 Carotid artery syndrome (hemispheric): Secondary | ICD-10-CM

## 2014-06-08 DIAGNOSIS — E669 Obesity, unspecified: Secondary | ICD-10-CM | POA: Diagnosis present

## 2014-06-08 HISTORY — DX: Chronic diastolic (congestive) heart failure: I50.32

## 2014-06-08 HISTORY — DX: Slurred speech: R47.81

## 2014-06-08 LAB — CBC
HCT: 41.7 % (ref 39.0–52.0)
Hemoglobin: 13.5 g/dL (ref 13.0–17.0)
MCH: 30.5 pg (ref 26.0–34.0)
MCHC: 32.4 g/dL (ref 30.0–36.0)
MCV: 94.3 fL (ref 78.0–100.0)
PLATELETS: 152 10*3/uL (ref 150–400)
RBC: 4.42 MIL/uL (ref 4.22–5.81)
RDW: 13.8 % (ref 11.5–15.5)
WBC: 9.1 10*3/uL (ref 4.0–10.5)

## 2014-06-08 LAB — GLUCOSE, CAPILLARY
GLUCOSE-CAPILLARY: 159 mg/dL — AB (ref 70–99)
GLUCOSE-CAPILLARY: 158 mg/dL — AB (ref 70–99)
GLUCOSE-CAPILLARY: 158 mg/dL — AB (ref 70–99)
Glucose-Capillary: 156 mg/dL — ABNORMAL HIGH (ref 70–99)

## 2014-06-08 LAB — BASIC METABOLIC PANEL
ANION GAP: 8 (ref 5–15)
BUN: 10 mg/dL (ref 6–23)
CO2: 26 mmol/L (ref 19–32)
Calcium: 8.5 mg/dL (ref 8.4–10.5)
Chloride: 103 mmol/L (ref 96–112)
Creatinine, Ser: 0.92 mg/dL (ref 0.50–1.35)
GFR calc non Af Amer: 86 mL/min — ABNORMAL LOW (ref >90)
Glucose, Bld: 174 mg/dL — ABNORMAL HIGH (ref 70–99)
POTASSIUM: 3.8 mmol/L (ref 3.5–5.1)
SODIUM: 137 mmol/L (ref 135–145)

## 2014-06-08 MED ORDER — OXYCODONE HCL 5 MG PO TABS
5.0000 mg | ORAL_TABLET | ORAL | Status: DC | PRN
  Administered 2014-06-08 – 2014-06-10 (×5): 5 mg via ORAL
  Filled 2014-06-08 (×6): qty 1

## 2014-06-08 MED ORDER — METHOCARBAMOL 500 MG PO TABS
500.0000 mg | ORAL_TABLET | Freq: Three times a day (TID) | ORAL | Status: DC | PRN
  Administered 2014-06-09: 500 mg via ORAL
  Filled 2014-06-08: qty 1

## 2014-06-08 MED ORDER — SODIUM CHLORIDE 0.9 % IV SOLN
INTRAVENOUS | Status: DC
  Administered 2014-06-08: 500 mL via INTRAVENOUS
  Administered 2014-06-08: 23:00:00 via INTRAVENOUS

## 2014-06-08 NOTE — Consult Note (Signed)
[facility] Medical Consultation  Eddie Zimmerman UXN:[id number] DOB: 23-Mar-1946 DOA: 06/07/2014 PCP: Kandice Hams, MD   Requesting physician: Dr. Mayer Camel, orthopedic surgery Date of consultation: 06/08/14 Reason for consultation: Slurred speech  Impression/Recommendations Principal Problem:   Slurred speech: Certainly concerning for TIA. Symptoms resolved. Initial CT scan negative. Performing TIA workup including MRI, carotid Dopplers, fasting lipid panel. A1c done last week notes for the most part good control. Patient does not smoke. Patient already on daily aspirin Echocardiogram done this afternoon unrevealing. Also possibility could be medications. Have greatly decreased his muscle relaxer and narcotic medications Active Problems:   Primary osteoarthritis of right knee, status post knee replacement   Arthritis of knee   Chronic diastolic heart failure: Stable. No evidence of acute failure   Obesity (BMI 30-39.9): Patient is criteria with BMI greater than 30   Diabetes mellitus without complication: U5K notes relatively good control., CBG stable.   Hypertension: Blood pressure stable.    Hospitalists will followup again tomorrow. Please contact me if I can be of assistance in the meanwhile. Thank you for this consultation.  Chief Complaint: Slurred speech  HPI:  69 year old male past history of hypertension, diabetes mellitus admitted for right total knee arthroplasty where surgery occurred without incident. Today, 2/11, postop day 1, patient noted by physical therapy to initially speak some gibberish, possibly slurred speech. This lasted for less than 10 minutes. Code stroke called and initial CT scan unrevealing. Patient had actually no recollection of this event. He stated otherwise been feeling fine.   Review of Systems:  Patient with no current complaints of the right knee pain. Denies any headaches, vision changes, dysphagia, chest pain, palpitations, short of breath,  wheeze, cough, abdominal pain, hematuria, dysuria, constipation, diarrhea, focal extremity numbness or weakness or pain. Review of systems is otherwise negative.  Past Medical History  Diagnosis Date  . Diabetes mellitus without complication   . Hypertension   . Gout   . History of kidney stones   . Sleep apnea     cpap use  . Anxiety   . Depression   . Chronic kidney disease     renal calculi - treated /w lithotripsy, has also passed many spontaneously  . Arthritis     hands, wrist & Knee    Past Surgical History  Procedure Laterality Date  . Laparoscopic gastric banding    . Knee surgery Left   . Hernia repair      abdominal  . Joint replacement Left   . Tonsillectomy    . Arthroscopic repair pcl Bilateral     right- x1, left- x3    Social History:  reports that he has never smoked. He has never used smokeless tobacco. He reports that he drinks alcohol. He reports that he does not use illicit drugs.  Patient was at home with his wife and has had some limited ambulation because of his knee arthritis issues  No Known Allergies Family History  Problem Relation Age of Onset  . AAA (abdominal aortic aneurysm) Mother   . Alzheimer's disease Father   . Cirrhosis Brother     of the liver  . AAA (abdominal aortic aneurysm) Brother     Prior to Admission medications   Medication Sig Start Date End Date Taking? Authorizing Provider  allopurinol (ZYLOPRIM) 300 MG tablet Take 300 mg by mouth daily at 12 noon.    Yes Historical Provider, MD  aspirin 81 MG tablet Take 81 mg by mouth at bedtime.  Yes Historical Provider, MD  B Complex Vitamins (B COMPLEX PO) Take 1 tablet by mouth daily at 12 noon.    Yes Historical Provider, MD  Coenzyme Q10 (CO Q10) 100 MG CAPS Take 200 mg by mouth daily. Patient taking differently: Take 200 mg by mouth daily at 12 noon.  06/15/13  Yes Jettie Booze, MD  escitalopram (LEXAPRO) 10 MG tablet Take 10 mg by mouth daily at 12 noon.    Yes  Historical Provider, MD  fish oil-omega-3 fatty acids 1000 MG capsule Take 2 g by mouth daily at 12 noon.    Yes Historical Provider, MD  gabapentin (NEURONTIN) 600 MG tablet Take 600 mg by mouth 3 (three) times daily. 01/23/14  Yes Historical Provider, MD  HYDROcodone-acetaminophen (NORCO/VICODIN) 5-325 MG per tablet as needed. 03/27/14  Yes Historical Provider, MD  lisinopril (PRINIVIL,ZESTRIL) 20 MG tablet take 1 tablet by mouth once daily Patient taking differently: take 1 tablet by mouth once daily at noon 04/25/13  Yes Jettie Booze, MD  Multiple Vitamin (MULTIVITAMIN PO) Take 1 tablet by mouth daily at 12 noon.    Yes Historical Provider, MD  Naproxen Sodium 220 MG CAPS Take 220-440 mg by mouth 2 (two) times daily as needed.   Yes Historical Provider, MD  pioglitazone (ACTOS) 15 MG tablet Take 15 mg by mouth daily at 12 noon.    Yes Historical Provider, MD  rosuvastatin (CRESTOR) 5 MG tablet Take 5 mg by mouth daily at 12 noon.   Yes Historical Provider, MD  aspirin EC 325 MG tablet Take 1 tablet (325 mg total) by mouth 2 (two) times daily. 06/07/14   Leighton Parody, PA-C  methocarbamol (ROBAXIN) 500 MG tablet Take 1 tablet (500 mg total) by mouth 2 (two) times daily with a meal. 06/07/14   Leighton Parody, PA-C  oxyCODONE-acetaminophen (ROXICET) 5-325 MG per tablet Take 1 tablet by mouth every 4 (four) hours as needed. 06/07/14   Leighton Parody, PA-C   Physical Exam: Blood pressure 136/60, pulse 65, temperature 99.9 F (37.7 C), temperature source Oral, resp. rate 20, height 5\' 9"  (1.753 m), weight 119.659 kg (263 lb 12.8 oz), SpO2 93 %. Filed Vitals:   06/08/14 1552  BP: 136/60  Pulse: 65  Temp: 99.9 F (37.7 C)  Resp: 20     General:  Alert and oriented 3, no acute distress  Eyes: Sclerae nonicteric, asked Dr. movements are intact  ENT: Normocephalic, atraumatic, mucous membranes are moist  Neck: Thick, no JVD  Cardiovascular: Regular rate and rhythm,  S1-S2  Respiratory: Clear to auscultation bilaterally  Abdomen: Soft, nontender, nondistended, positive bowel sounds  Skin: No skin breaks, tears or lesions  Musculoskeletal: No clubbing or cyanosis, trace edema. Knee dressing noted. Patient has some limitations with flexion and extension of his right lower from a secondary to knee surgery. Otherwise grip, flexion and extension for upper and lower extremities is symmetric  Psychiatric: Patient is appropriate, no evidence of psychoses  Neurologic: Cranial nerves II through XII intact, downgoing toes, no dysmetria, no focal deficits.  Labs on Admission:  Basic Metabolic Panel:  Recent Labs Lab 06/08/14 0719  NA 137  K 3.8  CL 103  CO2 26  GLUCOSE 174*  BUN 10  CREATININE 0.92  CALCIUM 8.5   Liver Function Tests: No results for input(s): AST, ALT, ALKPHOS, BILITOT, PROT, ALBUMIN in the last 168 hours. No results for input(s): LIPASE, AMYLASE in the last 168 hours. No results for input(s):  AMMONIA in the last 168 hours. CBC:  Recent Labs Lab 06/08/14 0719  WBC 9.1  HGB 13.5  HCT 41.7  MCV 94.3  PLT 152   Cardiac Enzymes: No results for input(s): CKTOTAL, CKMB, CKMBINDEX, TROPONINI in the last 168 hours. BNP: Invalid input(s): POCBNP CBG:  Recent Labs Lab 06/07/14 1735 06/07/14 2146 06/08/14 0648 06/08/14 1032 06/08/14 1134  GLUCAP 101* 129* 159* 158* 158*    Radiological Exams on Admission: Ct Head Wo Contrast  06/08/2014   CLINICAL DATA:  Aphasia.  Sudden onset altered mental status.  EXAM: CT HEAD WITHOUT CONTRAST  TECHNIQUE: Contiguous axial images were obtained from the base of the skull through the vertex without intravenous contrast.  COMPARISON:  None.  FINDINGS: The ventricles and sulci are within normal limits for age. There is no evidence of acute infarct, intracranial hemorrhage, mass, midline shift, or extra-axial collection.  The orbits are unremarkable. Visualized paranasal sinuses are clear.  There is a small right mastoid effusion. There is no evidence of acute fracture.  IMPRESSION: Unremarkable CT appearance of the brain.  These results were called by telephone at the time of interpretation on 06/08/2014 at 11:15 am to Dr. Janann Colonel, who verbally acknowledged these results.   Electronically Signed   By: Logan Bores   On: 06/08/2014 11:17    EKG: Independently reviewed. ST-T wave abnormalities with inverted T waves in leads V2, V3  Time spent: 35 minutes  [facility] Pager [phone]  If 7PM-7AM, please contact night-coverage [email or web address] Password [facility] 06/08/2014, 4:50 PM

## 2014-06-08 NOTE — Evaluation (Signed)
Physical Therapy Evaluation Patient Details Name: Eddie Zimmerman MRN: [id number] DOB: 1946-04-04 Today's Date: 06/08/2014   History of Present Illness  Pt. admitted 06/07/14 for planned right TKA due to end stage OA.  During course of initial PT visit, this therapist became aware of pt. Having verbal expressive difficulties and word finding problems.  Christine, RN was notified of this change in pt, and she called Rapid response who called a code stroke.  Pt. Went for CT scan.  Results pending.  Clinical Impression  This patient underwent a right TKA and presents to PT with anticipated post-op decrease in strength and ROM, decreased functional mobility and gait. During initial eval, pt. Was noted to have expressive difficulty/work finding difficulties.  He could state his first name, but could not state his last name, birth date , current location.  When asked about why he was here, he could not state.  When I asked him to point to the body part that he was here for, he correctly pointed to right knee.  He was able to detect light touch and his strength was equal bilaterally in arms .  He appeared to have normal. Weakness in right LE for post op TKA.  Could strongly ankle pump on right side. Wife states pt. Phoned her about 0630 this am and he "sounded normal" Pt indicated to his wife after her arrival this am that he did not "feel right".  She stated she arrived to see him just minutes before my arrival and that "he was talking crazy talk".  Pt. Now back in room and speech issues have resolved.  Will plan to hold on further PT session today until he can be worked up for presumed neuro event.   Pt. Will benefit from acute PT to address his mobility/strength and ROM deficits following R TKA once medically cleared for therapies.      Follow Up Recommendations Home health PT;Supervision/Assistance - 24 hour    Equipment Recommendations  Rolling walker with 5" wheels    Recommendations for Other  Services       Precautions / Restrictions Precautions Precautions: Knee Precaution Booklet Issued: No Precaution Comments: Not presented to pt. when it was noticed that pt. was having word finding difficulties Restrictions Weight Bearing Restrictions: Yes RLE Weight Bearing: Weight bearing as tolerated      Mobility  Bed Mobility Overal bed mobility: Needs Assistance;+2 for physical assistance Bed Mobility: Sit to Supine       Sit to supine: Mod assist;+2 for physical assistance   General bed mobility comments: Pt. assisted from sit to sidelying to supine with +2 mod assist at shoulders and at LEs.    Transfers Overall transfer level: Needs assistance Equipment used: Rolling walker (2 wheeled) Transfers: Sit to/from Stand Sit to Stand: Min assist;+2 physical assistance         General transfer comment: Rapid response arrived and needed pt. back to bed.  Pt. able to stand and take 2 turning steps from recliner to bed with 2 min assist.  No focal weakness noted in transfer/  Ambulation/Gait Ambulation/Gait assistance: Min assist;+2 physical assistance Ambulation Distance (Feet): 2 Feet Assistive device: Rolling walker (2 wheeled) Gait Pattern/deviations: Step-to pattern     General Gait Details: Pt. able to bear weight on both LEs for transfer.  No buckling or impaired strength noted outside of usual post op weakness in R LE  Stairs            Wheelchair Mobility  Modified Rankin (Stroke Patients Only)       Balance                                             Pertinent Vitals/Pain Pain Assessment: Faces Faces Pain Scale: Hurts a little bit Pain Location: right knee Pain Intervention(s): Other (comment) (Rn made aware, pain med not given due to word finding diff.)    Home Living Family/patient expects to be discharged to:: Private residence Living Arrangements: Spouse/significant other Available Help at Discharge:  Family;Available 24 hours/day Type of Home: House Home Access: Stairs to enter Entrance Stairs-Rails: None Entrance Stairs-Number of Steps: 4 Home Layout: Two level;Able to live on main level with bedroom/bathroom Home Equipment: None      Prior Function Level of Independence: Independent               Hand Dominance   Dominant Hand: Left    Extremity/Trunk Assessment   Upper Extremity Assessment: Overall WFL for tasks assessed           Lower Extremity Assessment: Overall WFL for tasks assessed;RLE deficits/detail RLE Deficits / Details: good ankle pump, not further tested due to word finding issues becoming apparent       Communication   Communication: Expressive difficulties  Cognition Arousal/Alertness: Awake/alert Behavior During Therapy: WFL for tasks assessed/performed Overall Cognitive Status: Impaired/Different from baseline Area of Impairment:  (pt. unable to state answers to questions/word finding diffic)                    General Comments      Exercises Total Joint Exercises Ankle Circles/Pumps: AROM;Both;5 reps      Assessment/Plan    PT Assessment Patient needs continued PT services  PT Diagnosis Difficulty walking;Acute pain   PT Problem List Decreased strength;Decreased activity tolerance;Decreased mobility;Pain;Decreased knowledge of use of DME;Decreased knowledge of precautions;Other (comment) (word finiding difficulties)  PT Treatment Interventions DME instruction;Gait training   PT Goals (Current goals can be found in the Care Plan section) Acute Rehab PT Goals Patient Stated Goal: wife states pt. to return home for recovery PT Goal Formulation: With patient/family Time For Goal Achievement: 06/15/14 Potential to Achieve Goals: Good    Frequency 7X/week   Barriers to discharge        Co-evaluation               End of Session   Activity Tolerance: Treatment limited secondary to medical complications  (Comment);Other (comment) (rapid response/code stroke called) Patient left: in bed;with family/visitor present;with nursing/sitter in room;Other (comment) (multiple RNs in room including rapid response) Nurse Communication: Other (comment) (RN Altha Harm made aware of speech deficits)         Time: [phone] PT Time Calculation (min) (ACUTE ONLY): 27 min   Charges:   PT Evaluation $Initial PT Evaluation Tier I: 1 Procedure PT Treatments $Gait Training: 8-22 mins   PT G CodesLadona Ridgel 06/08/2014, 11:06 AM Gerlean Ren PT Acute Rehab Services [phone] [place] [phone]

## 2014-06-08 NOTE — Progress Notes (Signed)
OT Cancellation Note  Patient Details Name: ZACHREY DEUTSCHER MRN: [id number] DOB: 1945-10-22   Cancelled Treatment:    Reason Eval/Treat Not Completed: Patient not medically ready;Other (comment) (Rapid response and code stroke called during PT session). Will re-attempt OT eval/treat when appropriate.   Thorsten Climer , MS, OTR/L, CLT Pager: [phone]  06/08/2014, 1:55 PM

## 2014-06-08 NOTE — Significant Event (Signed)
Rapid Response Event Note  Overview: Time Called: 1025 Event Type: Neurologic  Initial Focused Assessment: Patient last know well at 0930 this am per staff. A few minutes after 10am a physical therapist came into work with the patient.  He was sitting upright in the chair.  She noted some slurred speech and difficulty finding words. He had a right total knee replacement yesterday BP 113/50  SR 69 RR 18.  O2sat on RA 88%,  Placed on 2l [place] o2 sat 94%  Temp 98.7  Interventions: Patient with expressive aphasia, code stroke called Patient assisted to bed, head of bed flat.  NIHSS 2, stated age 89, and difficulty finding words. Head CT done Dr Janann Colonel assessed patient while in CT NIHSS 0, patient with fluent speech Stroke swallow screen done Transferred to 4N for closer monitoring  Event Summary: Name of Physician Notified: Dr Mayer Camel at 1025  Name of Consulting Physician Notified: Dr Janann Colonel at 1028  Outcome: Transferred (Comment) (4N)  Event End Time: [place]  Raliegh Ip

## 2014-06-08 NOTE — Progress Notes (Signed)
   06/08/14 2339  BiPAP/CPAP/SIPAP  BiPAP/CPAP/SIPAP Pt Type Adult  Mask Type Nasal mask  Mask Size Medium  Respiratory Rate 18 breaths/min  EPAP 13 cmH2O  Flow Rate 2 lpm  BiPAP/CPAP/SIPAP CPAP  Patient Home Equipment No  Auto Titrate No  Placed patient on CPAP 13 with 2L O2 bleed in.

## 2014-06-08 NOTE — Progress Notes (Signed)
Patient ID: Eddie Zimmerman, male   DOB: 09/10/1945, 69 y.o.   MRN: [id number] PATIENT ID: Eddie Zimmerman  MRN: [id number]  DOB/AGE:  Sep 23, 1945 / 69 y.o.  1 Day Post-Op Procedure(s) (LRB): TOTAL KNEE ARTHROPLASTY (Right)    PROGRESS NOTE Subjective: Patient is alert, oriented, no Nausea, no Vomiting, yes passing gas, no Bowel Movement. Taking PO well. Denies SOB, Chest or Calf Pain. Using Incentive Spirometer, PAS in place. Ambulate WBAT, CPM 0-40 Patient reports pain as 4 on 0-10 scale  .    Objective: Vital signs in last 24 hours: Filed Vitals:   06/07/14 1700 06/07/14 2021 06/07/14 2116 06/08/14 0500  BP: 144/67 134/64  121/63  Pulse: 50 61  63  Temp: 98.3 F (36.8 C) 98.6 F (37 C)  98.6 F (37 C)  TempSrc: Oral Oral  Oral  Resp: 14 18 18 20   Height:      Weight:      SpO2: 97% 96%  96%      Intake/Output from previous day: I/O last 3 completed shifts: In: 1300 [I.V.:1200; Other:100] Out: 1150 [Urine:1050; Drains:100]   Intake/Output this shift:     LABORATORY DATA:  Recent Labs  06/07/14 1735 06/07/14 2146 06/08/14 0648  GLUCAP 101* 129* 159*    Examination: Neurologically intact ABD soft Neurovascular intact Sensation intact distally Intact pulses distally Dorsiflexion/Plantar flexion intact Incision: dressing C/D/I No cellulitis present Compartment soft} Blood and plasma separated in drain indicating minimal recent drainage, drain pulled without difficulty. Assessment:   1 Day Post-Op Procedure(s) (LRB): TOTAL KNEE ARTHROPLASTY (Right) ADDITIONAL DIAGNOSIS: Expected Acute Blood Loss Anemia, gout, Diabetes, HTN  Plan: PT/OT WBAT, CPM 5/hrs day until ROM 0-90 degrees, then D/C CPM DVT Prophylaxis:  SCDx72hrs, ASA 325 mg BID x 2 weeks DISCHARGE PLAN: Home DISCHARGE NEEDS: HHPT, CPM, Walker and 3-in-1 comode seat     Eddie Zimmerman,Eddie Zimmerman 06/08/2014, 7:29 AM

## 2014-06-08 NOTE — Progress Notes (Signed)
Patient displaying normal behavior this am with 0800 meds.  Pain medication given per request.  Notified by therapy shortly after 1000 that patient's wife was concerned that he was acting strange.  Patient noted to have trouble expressing himself.  Vitals obtained, rapid response called, Joanell Rising, PA for Dr. Mayer Camel notified.  Rapid resonse activated code stroke.  Patient taken down to CT scan.  Patient back from CT scan, placed on tele, EKG showed NSR.  Patient able to express himself appropriately upon return.  Labs, MRI of brain and head, echo, carotid dopplers ordered.  Q2H neuro checks implemented.  Patient neurologically intact.  Echo completed.  Report called to 4N for transfer.  Patient transferred.

## 2014-06-08 NOTE — Progress Notes (Signed)
Orthopedic Tech Progress Note Patient Details:  Eddie Zimmerman [birth year] [id number] On cpm at 6:10 pm Patient ID: Eddie Zimmerman, male   DOB: May 29, 1945, 69 y.o.   MRN: [id number]   [name] 06/08/2014, 6:13 PM

## 2014-06-08 NOTE — Progress Notes (Signed)
  Echocardiogram 2D Echocardiogram has been performed.  Eddie Zimmerman M 06/08/2014, 3:27 PM

## 2014-06-08 NOTE — Progress Notes (Signed)
Orthopedic Tech Progress Note Patient Details:  Eddie Zimmerman 07-10-1945 [id number] Off cpm 9:25 pm Patient ID: Eddie Zimmerman, male   DOB: October 15, 1945, 69 y.o.   MRN: [id number]   Eddie Zimmerman 06/08/2014, 9:26 PM

## 2014-06-08 NOTE — Consult Note (Signed)
Stroke Consult    Chief Complaint: aphasia and slurred speech  HPI: Eddie Zimmerman is an 69 y.o. male with hx of HTN, DM, CKD admitted for right total knee arthroplasty, is currently post-op day 1. Surgery completed without complications and patient was in normal state of health until 0800 this morning. At that time nurse noted patient "talking crazy", words did not make sense. Upon my arrival in CT patient had returned to baseline. Per rapid response nurse the patient was upright upon her arrival and had continued aphasia, upon reclining the bed flat it appears the symptoms improved.   CT head imaging reviewed and overall unremarkable. He is on daily ASA 325mg . Had received low dose dilaudid at 0110 and 0749 that morning.   Date last known well: 06/08/14 Time last known well: 0800 tPA Given: no, resolution of symptoms  Past Medical History  Diagnosis Date  . Diabetes mellitus without complication   . Hypertension   . Gout   . History of kidney stones   . Sleep apnea     cpap use  . Anxiety   . Depression   . Chronic kidney disease     renal calculi - treated /w lithotripsy, has also passed many spontaneously  . Arthritis     hands, wrist & Knee     Past Surgical History  Procedure Laterality Date  . Laparoscopic gastric banding    . Knee surgery Left   . Hernia repair      abdominal  . Joint replacement Left   . Tonsillectomy    . Arthroscopic repair pcl Bilateral     right- x1, left- x3     Family History  Problem Relation Age of Onset  . AAA (abdominal aortic aneurysm) Mother   . Alzheimer's disease Father   . Cirrhosis Brother     of the liver  . AAA (abdominal aortic aneurysm) Brother    Social History:  reports that he has never smoked. He has never used smokeless tobacco. He reports that he drinks alcohol. He reports that he does not use illicit drugs.  Allergies: No Known Allergies  Medications Prior to Admission  Medication Sig Dispense Refill  .  allopurinol (ZYLOPRIM) 300 MG tablet Take 300 mg by mouth daily at 12 noon.     [name] aspirin 81 MG tablet Take 81 mg by mouth at bedtime.     . B Complex Vitamins (B COMPLEX PO) Take 1 tablet by mouth daily at 12 noon.     . Coenzyme Q10 (CO Q10) 100 MG CAPS Take 200 mg by mouth daily. (Patient taking differently: Take 200 mg by mouth daily at 12 noon. ) 30 each   . escitalopram (LEXAPRO) 10 MG tablet Take 10 mg by mouth daily at 12 noon.     . fish oil-omega-3 fatty acids 1000 MG capsule Take 2 g by mouth daily at 12 noon.     . gabapentin (NEURONTIN) 600 MG tablet Take 600 mg by mouth 3 (three) times daily.  0  . HYDROcodone-acetaminophen (NORCO/VICODIN) 5-325 MG per tablet as needed.  0  . lisinopril (PRINIVIL,ZESTRIL) 20 MG tablet take 1 tablet by mouth once daily (Patient taking differently: take 1 tablet by mouth once daily at noon) 60 tablet 5  . Multiple Vitamin (MULTIVITAMIN PO) Take 1 tablet by mouth daily at 12 noon.     . Naproxen Sodium 220 MG CAPS Take 220-440 mg by mouth 2 (two) times daily as needed.    [name]  pioglitazone (ACTOS) 15 MG tablet Take 15 mg by mouth daily at 12 noon.     . rosuvastatin (CRESTOR) 5 MG tablet Take 5 mg by mouth daily at 12 noon.      ROS: Out of a complete 14 system review, the patient complains of only the following symptoms, and all other reviewed systems are negative. +fatigue    Physical Examination: Filed Vitals:   06/08/14 1015  BP: 113/50  Pulse: 69  Temp: 98.7 F (37.1 C)  Resp:    Physical Exam  Constitutional: He appears well-developed and well-nourished.  Psych: Affect appropriate to situation Eyes: No scleral injection HENT: No OP obstrucion Head: Normocephalic.  Cardiovascular: Normal rate and regular rhythm.  Respiratory: Effort normal and breath sounds normal.  GI: Soft. Bowel sounds are normal. No distension. There is no tenderness.  Skin: WDI   Neurologic Examination: Mental Status: Alert, oriented x 3, thought  content appropriate.  Speech fluent without evidence of aphasia.  No dysarthria. Able to follow 3 step commands without difficulty. Cranial Nerves: II: funduscopic exam wnl bilaterally, visual fields grossly normal, pupils equal, round, reactive to light and accommodation III,IV, VI: ptosis not present, extra-ocular motions intact bilaterally V,VII: smile symmetric, facial light touch sensation normal bilaterally VIII: hearing normal bilaterally IX,X: gag reflex present XI: trapezius strength/neck flexion strength normal bilaterally XII: tongue strength normal  Motor: Right : Upper extremity    Left:     Upper extremity 5/5 deltoid       5/5 deltoid 5/5 biceps      5/5 biceps  5/5 triceps      5/5 triceps 5/5 hand grip      5/5 hand grip  Lower extremity     Lower extremity Did not formally test RLE due to recent surgery LLE 5/5 strength proximal and distal Tone and bulk:normal tone throughout; no atrophy noted Sensory: LT and PP intact in all extremities. Diminished 2-pt discrimination in distal RLE Deep Tendon Reflexes: 2+ and symmetric throughout Plantars: Right: downgoing   Left: downgoing Cerebellar: normal finger-to-nose, did not test HTS Gait: deferred  Laboratory Studies:   Basic Metabolic Panel:  Recent Labs Lab 06/08/14 0719  NA 137  K 3.8  CL 103  CO2 26  GLUCOSE 174*  BUN 10  CREATININE 0.92  CALCIUM 8.5    Liver Function Tests: No results for input(s): AST, ALT, ALKPHOS, BILITOT, PROT, ALBUMIN in the last 168 hours. No results for input(s): LIPASE, AMYLASE in the last 168 hours. No results for input(s): AMMONIA in the last 168 hours.  CBC:  Recent Labs Lab 06/08/14 0719  WBC 9.1  HGB 13.5  HCT 41.7  MCV 94.3  PLT 152    Cardiac Enzymes: No results for input(s): CKTOTAL, CKMB, CKMBINDEX, TROPONINI in the last 168 hours.  BNP: Invalid input(s): POCBNP  CBG:  Recent Labs Lab 06/07/14 1532 06/07/14 1735 06/07/14 2146 06/08/14 0648  06/08/14 1032  GLUCAP 84 101* 129* 159* 158*    Microbiology: Results for orders placed or performed during the hospital encounter of 05/29/14  Surgical pcr screen     Status: Abnormal   Collection Time: 05/29/14  9:20 AM  Result Value Ref Range Status   MRSA, PCR NEGATIVE NEGATIVE Final   Staphylococcus aureus POSITIVE (A) NEGATIVE Final    Comment:        The Xpert SA Assay (FDA approved for NASAL specimens in patients over 59 years of age), is one component of a comprehensive surveillance program.  Test performance has been validated by [facility] for patients greater than or equal to 41 year old. It is not intended to diagnose infection nor to guide or monitor treatment.     Coagulation Studies: No results for input(s): LABPROT, INR in the last 72 hours.  Urinalysis: No results for input(s): COLORURINE, LABSPEC, PHURINE, GLUCOSEU, HGBUR, BILIRUBINUR, KETONESUR, PROTEINUR, UROBILINOGEN, NITRITE, LEUKOCYTESUR in the last 168 hours.  Invalid input(s): APPERANCEUR  Lipid Panel:     Component Value Date/Time   CHOL 143 04/10/2014 0920   TRIG 130.0 04/10/2014 0920   HDL 34.90* 04/10/2014 0920   CHOLHDL 4 04/10/2014 0920   VLDL 26.0 04/10/2014 0920   LDLCALC 82 04/10/2014 0920    HgbA1C:  Lab Results  Component Value Date   HGBA1C 7.2* 05/29/2014    Urine Drug Screen:  No results found for: LABOPIA, COCAINSCRNUR, LABBENZ, AMPHETMU, THCU, LABBARB  Alcohol Level: No results for input(s): ETH in the last 168 hours. Other results: EKG: NSR  Imaging: No results found.  Assessment: 69 y.o. male hx of HTN, DM post-op day 1 for right total knee arthroplasty. Developed acute onset of aphasia and dysarthria. Symptoms fully resolved at this time. History pertinent for improvement of symptoms upon placing head of bed flat. Concern for possible carotid stenosis as etiology of symptoms. Will do TIA workup.   Plan: 1. HgbA1c, fasting lipid panel 2. MRI, MRA  of the  brain without contrast 3. Rehab currently following patient 4. Echocardiogram 5. Carotid dopplers 6. Prophylactic therapy-continue ASA 325mg  daily 7. Risk factor modification 8. Telemetry monitoring 9. Frequent neuro checks    Jim Like, DO [facility] [phone]  If 7pm- 7am, please page neurology on call as listed in [place]. 06/08/2014, 11:18 AM

## 2014-06-09 ENCOUNTER — Encounter (HOSPITAL_COMMUNITY): Payer: Self-pay | Admitting: Orthopedic Surgery

## 2014-06-09 ENCOUNTER — Inpatient Hospital Stay (HOSPITAL_COMMUNITY): Payer: Medicare Other

## 2014-06-09 DIAGNOSIS — R4701 Aphasia: Secondary | ICD-10-CM

## 2014-06-09 DIAGNOSIS — M129 Arthropathy, unspecified: Secondary | ICD-10-CM

## 2014-06-09 LAB — BASIC METABOLIC PANEL
Anion gap: 7 (ref 5–15)
BUN: 8 mg/dL (ref 6–23)
CHLORIDE: 103 mmol/L (ref 96–112)
CO2: 25 mmol/L (ref 19–32)
CREATININE: 0.97 mg/dL (ref 0.50–1.35)
Calcium: 8.2 mg/dL — ABNORMAL LOW (ref 8.4–10.5)
GFR calc Af Amer: >90 mL/min (ref >90)
GFR, EST NON AFRICAN AMERICAN: 84 mL/min — AB (ref >90)
Glucose, Bld: 201 mg/dL — ABNORMAL HIGH (ref 70–99)
Potassium: 3.5 mmol/L (ref 3.5–5.1)
Sodium: 135 mmol/L (ref 135–145)

## 2014-06-09 LAB — CBC
HEMATOCRIT: 39.3 % (ref 39.0–52.0)
HEMOGLOBIN: 12.7 g/dL — AB (ref 13.0–17.0)
MCH: 29.9 pg (ref 26.0–34.0)
MCHC: 32.3 g/dL (ref 30.0–36.0)
MCV: 92.5 fL (ref 78.0–100.0)
Platelets: 134 10*3/uL — ABNORMAL LOW (ref 150–400)
RBC: 4.25 MIL/uL (ref 4.22–5.81)
RDW: 13.9 % (ref 11.5–15.5)
WBC: 10.2 10*3/uL (ref 4.0–10.5)

## 2014-06-09 LAB — LIPID PANEL
Cholesterol: 125 mg/dL (ref 0–200)
HDL: 37 mg/dL — ABNORMAL LOW (ref >39)
LDL Cholesterol: 72 mg/dL (ref 0–99)
TRIGLYCERIDES: 80 mg/dL (ref <150)
Total CHOL/HDL Ratio: 3.4 RATIO
VLDL: 16 mg/dL (ref 0–40)

## 2014-06-09 LAB — GLUCOSE, CAPILLARY
GLUCOSE-CAPILLARY: 145 mg/dL — AB (ref 70–99)
Glucose-Capillary: 167 mg/dL — ABNORMAL HIGH (ref 70–99)
Glucose-Capillary: 149 mg/dL — ABNORMAL HIGH (ref 70–99)

## 2014-06-09 LAB — HEMOGLOBIN A1C
Hgb A1c MFr Bld: 7.2 % — ABNORMAL HIGH (ref 4.8–5.6)
MEAN PLASMA GLUCOSE: 160 mg/dL

## 2014-06-09 MED ORDER — ASPIRIN EC 325 MG PO TBEC: 325.0000 mg | DELAYED_RELEASE_TABLET | Freq: Two times a day (BID) | ORAL | Status: DC

## 2014-06-09 MED ORDER — DOCUSATE SODIUM 100 MG PO CAPS: 100.0000 mg | ORAL_CAPSULE | Freq: Two times a day (BID) | ORAL | Status: DC | PRN

## 2014-06-09 MED ORDER — ENOXAPARIN SODIUM 40 MG/0.4ML ~~LOC~~ SOLN
40.0000 mg | SUBCUTANEOUS | Status: DC
  Administered 2014-06-09: 40 mg via SUBCUTANEOUS
  Filled 2014-06-09: qty 0.4

## 2014-06-09 NOTE — Discharge Summary (Addendum)
Physician Discharge Summary  Patient ID: Eddie Zimmerman MRN: [id number] DOB/AGE: 08-19-1945 69 y.o.  Admit date: 06/07/2014 Discharge date: 06/09/2014  Primary Care Physician:  Kandice Hams, MD  Discharge Diagnoses:     Acute CVA  . Primary osteoarthritis of right knee status post total knee replacement  . hyperlipidemia  . diabetes mellitus  . Obesity (BMI 30-39.9) . Hypertension  Consults:  Orthopedics, Dr. Mayer Camel Neurology, Dr. Leonie Man  Recommendations for Outpatient Follow-up:  Please follow hemoglobin A1c closely, 7.2, patient just had acute CVA needs good glycemic control  Please continue risk factor modification for secondary stroke prevention  Per orthopedist recommendation he will continue aspirin 325 mg twice a day for 2 weeks and then subsequently aspirin 325 mg daily thereafter for stroke prevention  TESTS THAT NEED FOLLOW-UP Hemoglobin A1c   DIET: Carb modified    Allergies:  No Known Allergies   Discharge Medications:   Medication List    STOP taking these medications        aspirin 81 MG tablet  Replaced by:  aspirin EC 325 MG tablet     HYDROcodone-acetaminophen 5-325 MG per tablet  Commonly known as:  NORCO/VICODIN      TAKE these medications        allopurinol 300 MG tablet  Commonly known as:  ZYLOPRIM  Take 300 mg by mouth daily at 12 noon.     aspirin EC 325 MG tablet  Take 1 tablet (325 mg total) by mouth 2 (two) times daily. Please take two times a day for 2 weeks, then once daily thereafter     B COMPLEX PO  Take 1 tablet by mouth daily at 12 noon.     Co Q10 100 MG Caps  Take 200 mg by mouth daily.     docusate sodium 100 MG capsule  Commonly known as:  COLACE  Take 1 capsule (100 mg total) by mouth 2 (two) times daily as needed for mild constipation.     fish oil-omega-3 fatty acids 1000 MG capsule  Take 2 g by mouth daily at 12 noon.     gabapentin 600 MG tablet  Commonly known as:  NEURONTIN  Take 600 mg by mouth 3  (three) times daily.     LEXAPRO 10 MG tablet  Generic drug:  escitalopram  Take 10 mg by mouth daily at 12 noon.     lisinopril 20 MG tablet  Commonly known as:  PRINIVIL,ZESTRIL  take 1 tablet by mouth once daily     methocarbamol 500 MG tablet  Commonly known as:  ROBAXIN  Take 1 tablet (500 mg total) by mouth 2 (two) times daily with a meal.     MULTIVITAMIN PO  Take 1 tablet by mouth daily at 12 noon.     Naproxen Sodium 220 MG Caps  Take 220-440 mg by mouth 2 (two) times daily as needed.     oxyCODONE-acetaminophen 5-325 MG per tablet  Commonly known as:  ROXICET  Take 1 tablet by mouth every 4 (four) hours as needed.     pioglitazone 15 MG tablet  Commonly known as:  ACTOS  Take 15 mg by mouth daily at 12 noon.     rosuvastatin 5 MG tablet  Commonly known as:  CRESTOR  Take 5 mg by mouth daily at 12 noon.         Brief H and P: For complete details please refer to admission H and P, but in brief 69 year old male past history  of hypertension, diabetes mellitus admitted for right total knee arthroplasty where surgery occurred without incident.  On 2/11, postop day 1, patient noted by physical therapy to initially speak some gibberish, possibly slurred speech. This lasted for less than 10 minutes. Code stroke called and initial CT scan unrevealing. Patient had actually no recollection of this event. He stated otherwise been feeling fine, stroke workup was initiated.  Hospital Course:   Acute CVA: Dysarthria resolved, was not administered TPA secondary to resolution of the symptoms CT of the head was unremarkable. MRI of the brain showed a left MCA parietal infarct likely embolic. MRA was unremarkable 2-D echo showed no source of embolus Carotid Dopplers showed 1-39% ICA stenosis Hemoglobin A1c 7.2, needs outpatient tight glycemic control LDL 72, continue Crestor PTOT evaluation recommended home health PT Patient was not on any antithrombotic prior to admission,  currently recommended aspirin 325 mg twice a day for 2 weeks and then continue aspirin 325 mg daily Patient will need TEE for workup of the cryptogenic stroke, cardiology service was called [facility] Sender), will set up outpatient TEE, patient will be called with appointment.     Primary osteoarthritis of right kneeStatus post total knee replacement Postop day #2, patient was followed closely by orthopedics. Home PT OT arranged    Obesity (BMI 30-39.9) Patient recommended diet and weight control    Diabetes mellitus without complication -Hemoglobin A1c 7.2 needs tight outpatient glycemic control    Hypertension Currently stable  Low-grade fever: Likely postop due to atelectasis, chest x-ray negative for any pneumonia  Day of Discharge BP 129/63 mmHg  Pulse 69  Temp(Src) 98.6 F (37 C) (Oral)  Resp 16  Ht 5\' 9"  (1.753 m)  Wt 119.659 kg (263 lb 12.8 oz)  BMI 38.94 kg/m2  SpO2 92%  Physical Exam: General: Alert and awake oriented x3 not in any acute distress. CVS: S1-S2 clear no murmur rubs or gallops Chest: clear to auscultation bilaterally, no wheezing rales or rhonchi Abdomen: soft nontender, nondistended, normal bowel sounds Extremities: no cyanosis, clubbing, right lower extremity in dressing  Neuro: Cranial nerves II-XII intact, no focal neurological deficits, no dysarthria, unable to assess right lower extremity secondary to knee surgery   The results of significant diagnostics from this hospitalization (including imaging, microbiology, ancillary and laboratory) are listed below for reference.    LAB RESULTS: Basic Metabolic Panel:  Recent Labs Lab 06/08/14 0719 06/09/14 0710  NA 137 135  K 3.8 3.5  CL 103 103  CO2 26 25  GLUCOSE 174* 201*  BUN 10 8  CREATININE 0.92 0.97  CALCIUM 8.5 8.2*   Liver Function Tests: No results for input(s): AST, ALT, ALKPHOS, BILITOT, PROT, ALBUMIN in the last 168 hours. No results for input(s): LIPASE, AMYLASE in the last 168  hours. No results for input(s): AMMONIA in the last 168 hours. CBC:  Recent Labs Lab 06/08/14 0719 06/09/14 0543  WBC 9.1 10.2  HGB 13.5 12.7*  HCT 41.7 39.3  MCV 94.3 92.5  PLT 152 134*   Cardiac Enzymes: No results for input(s): CKTOTAL, CKMB, CKMBINDEX, TROPONINI in the last 168 hours. BNP: Invalid input(s): POCBNP CBG:  Recent Labs Lab 06/09/14 0656 06/09/14 1110  GLUCAP 167* 149*    Significant Diagnostic Studies:  Dg Chest 2 View  05/29/2014   CLINICAL DATA:  Hypertension.  EXAM: CHEST  2 VIEW  COMPARISON:  02/2010.  FINDINGS: Mediastinum and hilar structures are normal. Cardiomegaly with normal pulmonary vascularity. Mild lingular infiltrate cannot be excluded. No pleural  effusion or pneumothorax. Catheter is noted over the left upper abdomen.  IMPRESSION: 1. Mild lingular infiltrate cannot be excluded. 2. Stable cardiomegaly .   Electronically Signed   By: Marcello Moores  Register   On: 05/29/2014 09:46    2D ECHO: Study Conclusions  - Left ventricle: The cavity size was normal. Wall thickness was increased in a pattern of moderate LVH. Systolic function was normal. The estimated ejection fraction was in the range of 55% to 60%. - Left atrium: The atrium was mildly dilated. - Atrial septum: No defect or patent foramen ovale was identified. - Pulmonary arteries: PA peak pressure: 33 mm Hg (S).  Disposition and Follow-up: Discharge Instructions    Diet Carb Modified    Complete by:  As directed      Increase activity slowly    Complete by:  As directed             DISPOSITION: Home with home health PT  DISCHARGE FOLLOW-UP Follow-up Information    Follow up with Kerin Salen, MD In 2 weeks.   Specialty:  Orthopedic Surgery   Contact information:   [place] [address] [phone]       Please follow up.   Why:  Office will call you to set up outpatient TEE (transesophageal echo).    Contact information:   [facility]       Follow up with SETHI,PRAMOD, MD. Schedule an appointment as soon as possible for a visit in 1 month.   Specialties:  Neurology, Radiology   Why:  for hospital follow-up/STROKE   Contact information:   [address] [place] [address] [phone]        Time spent on Discharge: 40 minutes  Signed:   RAI,RIPUDEEP M.D. [facility] 06/09/2014, 5:32 PM Pager: [phone]

## 2014-06-09 NOTE — Care Management Note (Signed)
    Page 1 of 2   06/09/2014     3:03:22 PM CARE MANAGEMENT NOTE 06/09/2014  Patient:  Eddie Zimmerman, Eddie Zimmerman   Account Number:  [id number]  Date Initiated:  06/08/2014  Documentation initiated by:  [facility]  Subjective/Objective Assessment:   s/p right total knee arthroplasty     Action/Plan:   PT/OT evals-   Anticipated DC Date:     Anticipated DC Plan:  [place]  CM consult      Choice offered to / List presented to:     DME arranged  3-N-1  [place]  CPM      DME agency  [employer]     [place] arranged  HH-2 PT      [facility] agency  [facility]   Status of service:  In process, will continue to follow Medicare Important Message given?  YES (If response is "NO", the following Medicare IM given date fields will be blank) Date Medicare IM given:  06/09/2014 Medicare IM given by:  Lorne Skeens Date Additional Medicare IM given:   Additional Medicare IM given by:    Discharge Disposition:  [place]  Per UR Regulation:  Reviewed for med. necessity/level of care/duration of stay  If discussed at [place] of Stay Meetings, dates discussed:    Comments:  06/09/14 [place], MSN, CM- Medicare IM letter provided.   06/08/14 Set up with [facility] for HHPT by MD office.

## 2014-06-09 NOTE — Progress Notes (Signed)
Patient ID: Eddie Zimmerman  male  QAS:[id number]    DOB: [birth year]    DOA: 06/07/2014  PCP: Kandice Hams, MD  Assessment/Plan: Principal Problem: Acute CVA: Dysarthria resolved, was not administered TPA secondary to resolution of the symptoms CT of the head was unremarkable. MRI of the brain showed a left MCA parietal infarct likely embolic. MRA was unremarkable 2-D echo showed no source of embolus Carotid Dopplers pending Hemoglobin A1c 7.2, needs outpatient tight glycemic control LDL 72, continue Crestor -PTOT evaluation recommended home health PT Patient was not on any antithrombotic prior to admission, currently recommended aspirin 325 mg twice a day for 2 weeks and then continue aspirin 325 mg daily Patient will need TEE for workup of the cryptogenic stroke, cardiology service was called Wann[facility] Sender), will set up outpatient TEE, patient will be called with appointment.   Primary osteoarthritis of right kneeStatus post total knee replacement Postop day #2, patient was followed closely by orthopedics. Home PT OT arranged   Obesity (BMI 30-39.9) Patient recommended diet and weight control   Diabetes mellitus without complication -Hemoglobin A1c 7.2 needs tight outpatient glycemic control   Hypertension Currently stable  Low-grade fever: Likely postop due to atelectasis, chest x-ray negative for any pneumonia   DVT Prophylaxis: SCDs  Code Status: Full code  Family Communication: Discussed with patient in detail  Disposition: Pending carotid Dopplers  Consultants:  Orthopedics  Neurology  Procedures:  MRI, MRA brain, 2-D echo  Total knee replacement on 2/10  Antibiotics:  None    Subjective: Patient seen and examined, no dysarthria, back to baseline  Objective: Weight change:   Intake/Output Summary (Last 24 hours) at 06/09/14 1653 Last data filed at 06/09/14 1515  Gross per 24 hour  Intake   1280 ml  Output   1600 ml  Net   -320 ml    Blood pressure 129/63, pulse 69, temperature 98.6 F (37 C), temperature source Oral, resp. rate 16, height 5\' 9"  (1.753 m), weight 119.659 kg (263 lb 12.8 oz), SpO2 92 %.  Physical Exam: General: Alert and awake, oriented x3, not in any acute distress. HEENT: anicteric sclera, PERLA, EOMI CVS: S1-S2 clear, no murmur rubs or gallops Chest: clear to auscultation bilaterally, no wheezing, rales or rhonchi Abdomen: soft nontender, nondistended, normal bowel sounds  Extremities: no cyanosis, clubbing or edema noted bilaterally Neuro: Cranial nerves II-XII intact, unable to assess right lower extremity, recent surgery otherwise strength 5/ 5 in upper extremities bilaterally, left lower extremity 5/5  Lab Results: Basic Metabolic Panel:  Recent Labs Lab 06/08/14 0719 06/09/14 0710  NA 137 135  K 3.8 3.5  CL 103 103  CO2 26 25  GLUCOSE 174* 201*  BUN 10 8  CREATININE 0.92 0.97  CALCIUM 8.5 8.2*   Liver Function Tests: No results for input(s): AST, ALT, ALKPHOS, BILITOT, PROT, ALBUMIN in the last 168 hours. No results for input(s): LIPASE, AMYLASE in the last 168 hours. No results for input(s): AMMONIA in the last 168 hours. CBC:  Recent Labs Lab 06/08/14 0719 06/09/14 0543  WBC 9.1 10.2  HGB 13.5 12.7*  HCT 41.7 39.3  MCV 94.3 92.5  PLT 152 134*   Cardiac Enzymes: No results for input(s): CKTOTAL, CKMB, CKMBINDEX, TROPONINI in the last 168 hours. BNP: Invalid input(s): POCBNP CBG:  Recent Labs Lab 06/08/14 1032 06/08/14 1134 06/08/14 2322 06/09/14 0656 06/09/14 1110  GLUCAP 158* 158* 156* 167* 149*     Micro Results: No results found for this or any  previous visit (from the past 240 hour(s)).  Studies/Results: Dg Chest 2 View  06/09/2014   CLINICAL DATA:  Fever, status post knee replacement  EXAM: CHEST  2 VIEW  COMPARISON:  05/29/2014  FINDINGS: Cardiac shadow is within normal limits. The lungs are well aerated bilaterally. Minimal a lingular  atelectasis is again identified. No focal confluent infiltrate is seen. No pneumothorax or effusion is seen. No bony abnormality is noted.  IMPRESSION: Persistent mild lingular atelectasis.   Electronically Signed   By: Inez Catalina M.D.   On: 06/09/2014 13:06   Dg Chest 2 View  05/29/2014   CLINICAL DATA:  Hypertension.  EXAM: CHEST  2 VIEW  COMPARISON:  02/2010.  FINDINGS: Mediastinum and hilar structures are normal. Cardiomegaly with normal pulmonary vascularity. Mild lingular infiltrate cannot be excluded. No pleural effusion or pneumothorax. Catheter is noted over the left upper abdomen.  IMPRESSION: 1. Mild lingular infiltrate cannot be excluded. 2. Stable cardiomegaly .   Electronically Signed   By: [place]   On: 05/29/2014 09:46   Ct Head Wo Contrast  06/08/2014   CLINICAL DATA:  Aphasia.  Sudden onset altered mental status.  EXAM: CT HEAD WITHOUT CONTRAST  TECHNIQUE: Contiguous axial images were obtained from the base of the skull through the vertex without intravenous contrast.  COMPARISON:  None.  FINDINGS: The ventricles and sulci are within normal limits for age. There is no evidence of acute infarct, intracranial hemorrhage, mass, midline shift, or extra-axial collection.  The orbits are unremarkable. Visualized paranasal sinuses are clear. There is a small right mastoid effusion. There is no evidence of acute fracture.  IMPRESSION: Unremarkable CT appearance of the brain.  These results were called by telephone at the time of interpretation on 06/08/2014 at 11:15 am to Dr. Janann Colonel, who verbally acknowledged these results.   Electronically Signed   By: Logan Bores   On: 06/08/2014 11:17   Mr [name] Head Wo Contrast  06/09/2014   CLINICAL DATA:  Altered mental status, and difficulty speaking. Assess for stroke. History of hypertension, diabetes.  EXAM: MRI HEAD WITHOUT CONTRAST  MRA HEAD WITHOUT CONTRAST  TECHNIQUE: Multiplanar, multiecho pulse sequences of the brain and surrounding  structures were obtained without intravenous contrast. Angiographic images of the head were obtained using MRA technique without contrast.  COMPARISON:  CT of the head June 08, 2014 at 10:55 a.m.  FINDINGS: MRI HEAD FINDINGS  Mild motion degraded examination.  Patchy areas are reduced diffusion within LEFT parietal lobe evolving may deep white matter to the cortex. Corresponding low ADC values the mild FLAIR hyperintense signal. No susceptibility artifact to suggest hemorrhage.  Ventricles and sulci normal for patient's age. No midline shift, mass effect or mass lesions. Small inferior basal ganglia perivascular spaces. Remote small LEFT cerebellar infarct.  No abnormal extra-axial fluid collections. Crescentic T2 hyperintense signal within the LEFT vertebral artery V4 segment without correlating abnormality on MRA.  Ocular globes and orbital contents are unremarkable. No paranasal sinus air-fluid levels. Trace mastoid effusions. No abnormal sellar expansion. No cerebellar tonsillar ectopia. No suspicious calvarial bone marrow signal.  MRA HEAD FINDINGS  Mild motion degraded examination.  Anterior circulation: Normal flow related enhancement of the included cervical, petrous, cavernous and supra clinoid internal carotid arteries. Patent anterior communicating artery. Normal flow related enhancement of the anterior and middle cerebral arteries, including more distal segments.  No large vessel occlusion, high-grade stenosis, abnormal luminal irregularity, aneurysm.  Posterior circulation: LEFT vertebral artery is dominant. Basilar artery is  patent, with normal flow related enhancement of the main branch vessels. Normal flow related enhancement of the posterior cerebral arteries.  No large vessel occlusion, high-grade stenosis, abnormal luminal irregularity, aneurysm.  IMPRESSION: MRI HEAD: Mild motion degraded examination. Acute small to moderate LEFT middle cerebral artery territory infarct (LEFT parietal lobe).   Remote small LEFT cerebellar infarct.  MRA HEAD: Mild motion degraded examination, essentially normal MRA of the intracranial vessels.   Electronically Signed   By: Elon Alas   On: 06/09/2014 04:49   Mr Brain Wo Contrast  06/09/2014   CLINICAL DATA:  Altered mental status, and difficulty speaking. Assess for stroke. History of hypertension, diabetes.  EXAM: MRI HEAD WITHOUT CONTRAST  MRA HEAD WITHOUT CONTRAST  TECHNIQUE: Multiplanar, multiecho pulse sequences of the brain and surrounding structures were obtained without intravenous contrast. Angiographic images of the head were obtained using MRA technique without contrast.  COMPARISON:  CT of the head June 08, 2014 at 10:55 a.m.  FINDINGS: MRI HEAD FINDINGS  Mild motion degraded examination.  Patchy areas are reduced diffusion within LEFT parietal lobe evolving may deep white matter to the cortex. Corresponding low ADC values the mild FLAIR hyperintense signal. No susceptibility artifact to suggest hemorrhage.  Ventricles and sulci normal for patient's age. No midline shift, mass effect or mass lesions. Small inferior basal ganglia perivascular spaces. Remote small LEFT cerebellar infarct.  No abnormal extra-axial fluid collections. Crescentic T2 hyperintense signal within the LEFT vertebral artery V4 segment without correlating abnormality on MRA.  Ocular globes and orbital contents are unremarkable. No paranasal sinus air-fluid levels. Trace mastoid effusions. No abnormal sellar expansion. No cerebellar tonsillar ectopia. No suspicious calvarial bone marrow signal.  MRA HEAD FINDINGS  Mild motion degraded examination.  Anterior circulation: Normal flow related enhancement of the included cervical, petrous, cavernous and supra clinoid internal carotid arteries. Patent anterior communicating artery. Normal flow related enhancement of the anterior and middle cerebral arteries, including more distal segments.  No large vessel occlusion, high-grade  stenosis, abnormal luminal irregularity, aneurysm.  Posterior circulation: LEFT vertebral artery is dominant. Basilar artery is patent, with normal flow related enhancement of the main branch vessels. Normal flow related enhancement of the posterior cerebral arteries.  No large vessel occlusion, high-grade stenosis, abnormal luminal irregularity, aneurysm.  IMPRESSION: MRI HEAD: Mild motion degraded examination. Acute small to moderate LEFT middle cerebral artery territory infarct (LEFT parietal lobe).  Remote small LEFT cerebellar infarct.  MRA HEAD: Mild motion degraded examination, essentially normal MRA of the intracranial vessels.   Electronically Signed   By: Elon Alas   On: 06/09/2014 04:49    Medications: Scheduled Meds: . allopurinol  300 mg Oral Q1200  . aspirin EC  325 mg Oral Q breakfast  . docusate sodium  100 mg Oral BID  . escitalopram  10 mg Oral Q1200  . gabapentin  600 mg Oral TID  . insulin aspart  0-15 Units Subcutaneous TID WC  . lisinopril  20 mg Oral Daily  . pioglitazone  15 mg Oral Q1200  . pneumococcal 23 valent vaccine  0.5 mL Intramuscular Tomorrow-1000  . rosuvastatin  5 mg Oral Q1200   Time spent 25 minutes   LOS: 2 days   Reveca Desmarais M.D. [facility] 06/09/2014, 4:53 PM Pager: [phone]  If 7PM-7AM, please contact night-coverage [email or web address] Password [email or web address]

## 2014-06-09 NOTE — Progress Notes (Addendum)
Physical Therapy Treatment Patient Details Name: Eddie Zimmerman MRN: [id number] DOB: [birth year] Today's Date: 06/09/2014    History of Present Illness Pt. admitted 06/07/14 for R TKA, had suspected TIA on 06/08/14    PT Comments    Pt. Still with some difficulty with sit<>stand but progressed to +1 min assist with practice.  Progressing with gait.  Will need BID PT sessions on Saturday.  May not be ready for DC home from PT standpoint until Sunday.  Per Neurology, pt. Did have L MCA parietal infarct.    Follow Up Recommendations  Home health PT;Supervision/Assistance - 24 hour     Equipment Recommendations  Rolling walker with 5" wheels    Recommendations for Other Services       Precautions / Restrictions Precautions Precautions: Knee Precaution Comments: reinforced good right knee positioning in neutral LE rotation (tends to externally rotate) Restrictions Weight Bearing Restrictions: Yes RLE Weight Bearing: Weight bearing as tolerated    Mobility  Bed Mobility Overal bed mobility: Needs Assistance Bed Mobility: Supine to Sit;Sit to Supine     Supine to sit: Min assist Sit to supine: Min assist   General bed mobility comments: min assist for R  LE management and for forward hip translation on bed  Transfers Overall transfer level: Needs assistance Equipment used: Rolling walker (2 wheeled) Transfers: Sit to/from Stand Sit to Stand: Min assist;+2 safety/equipment         General transfer comment: Practiced sit<>stand 3 trials.  Pt. with some difficulty rising to stand from recliner and from bed.  Second person present for safety but did not have to assist this ap  Ambulation/Gait Ambulation/Gait assistance: Min assist;+2 safety/equipment Ambulation Distance (Feet): 100 Feet Assistive device: Rolling walker (2 wheeled) Gait Pattern/deviations: Step-to pattern;Decreased stance time - right;Decreased step length - right;Decreased weight shift to  right;Antalgic;Trunk flexed Gait velocity: decreased   General Gait Details: Pt. with ongoing need for cueing to activate quads and accept weight on R LE.  Encouraged pt. to avoid "pounding" on left LE ; min assist for safety and stability and second person for pushing recliner   Stairs            Wheelchair Mobility    Modified Rankin (Stroke Patients Only)       Balance                                    Cognition Arousal/Alertness: Awake/alert Behavior During Therapy: WFL for tasks assessed/performed Overall Cognitive Status: Within Functional Limits for tasks assessed                      Exercises      General Comments        Pertinent Vitals/Pain Pain Assessment: 0-10 Pain Score: 6  Pain Location: right knee Pain Descriptors / Indicators: Aching Pain Intervention(s): Limited activity within patient's tolerance;Monitored during session;Repositioned;Other (comment) (pt. reports he has had pain med )    Home Living                      Prior Function            PT Goals (current goals can now be found in the care plan section) Progress towards PT goals: Progressing toward goals    Frequency  7X/week    PT Plan Current plan remains appropriate    Co-evaluation  End of Session Equipment Utilized During Treatment: Gait belt Activity Tolerance: Patient tolerated treatment well Patient left: in bed;with call bell/phone within reach;with bed alarm set     Time: 6773-7366 PT Time Calculation (min) (ACUTE ONLY): 23 min  Charges:  $Gait Training: 23-37 mins                    G Codes:      Ladona Ridgel 06/09/2014, 2:52 PM Gerlean Ren PT [facility] [phone]

## 2014-06-09 NOTE — Progress Notes (Signed)
VASCULAR LAB PRELIMINARY  PRELIMINARY  PRELIMINARY  PRELIMINARY  Carotid duplex completed.    Preliminary report:  Bilateral:  1-39% ICA stenosis.  Vertebral artery flow is antegrade.     Sam Overbeck, RVS 06/09/2014, 4:53 PM

## 2014-06-09 NOTE — Progress Notes (Signed)
UR complete.  Marializ Ferrebee RN, MSN 

## 2014-06-09 NOTE — Progress Notes (Signed)
STROKE TEAM PROGRESS NOTE   HISTORY Eddie Zimmerman is an 69 y.o. male with hx of HTN, DM, CKD admitted for right total knee arthroplasty, is currently post-op day 1. Surgery completed without complications and patient was in normal state of health until 0800 this morning. At that time nurse noted patient "talking crazy", words did not make sense. Upon my arrival in CT patient had returned to baseline. Per rapid response nurse the patient was upright upon her arrival and had continued aphasia, upon reclining the bed flat it appears the symptoms improved.   CT head imaging reviewed and overall unremarkable. He is on daily ASA 325mg . Had received low dose dilaudid at 0110 and 0749 that morning.   He was last known well 06/08/14 at 0800. Patient was not administered TPA secondary to resolution of symptoms, 1 day post op.    SUBJECTIVE (INTERVAL HISTORY) His wife is at the bedside.  Overall he feels his condition is rapidly improving. He denies any history of atrial fibrillation, cardiac problems or prior strokes or TIAs   OBJECTIVE Temp:  [97.7 F (36.5 C)-100.6 F (38.1 C)] 100.6 F (38.1 C) (02/12 1006) Pulse Rate:  [57-72] 64 (02/12 1006) Cardiac Rhythm:  [-] Normal sinus rhythm (02/12 1034) Resp:  [18-20] 20 (02/12 1006) BP: (102-136)/(50-71) 131/71 mmHg (02/12 1006) SpO2:  [93 %-96 %] 95 % (02/12 1006)   Recent Labs Lab 06/08/14 1032 06/08/14 1134 06/08/14 2322 06/09/14 0656 06/09/14 1110  GLUCAP 158* 158* 156* 167* 149*    Recent Labs Lab 06/08/14 0719 06/09/14 0710  NA 137 135  K 3.8 3.5  CL 103 103  CO2 26 25  GLUCOSE 174* 201*  BUN 10 8  CREATININE 0.92 0.97  CALCIUM 8.5 8.2*   No results for input(s): AST, ALT, ALKPHOS, BILITOT, PROT, ALBUMIN in the last 168 hours.  Recent Labs Lab 06/08/14 0719 06/09/14 0543  WBC 9.1 10.2  HGB 13.5 12.7*  HCT 41.7 39.3  MCV 94.3 92.5  PLT 152 134*   No results for input(s): CKTOTAL, CKMB, CKMBINDEX, TROPONINI in the  last 168 hours. No results for input(s): LABPROT, INR in the last 72 hours. No results for input(s): COLORURINE, LABSPEC, Page, GLUCOSEU, HGBUR, BILIRUBINUR, KETONESUR, PROTEINUR, UROBILINOGEN, NITRITE, LEUKOCYTESUR in the last 72 hours.  Invalid input(s): APPERANCEUR     Component Value Date/Time   CHOL 125 06/09/2014 0543   TRIG 80 06/09/2014 0543   HDL 37* 06/09/2014 0543   CHOLHDL 3.4 06/09/2014 0543   VLDL 16 06/09/2014 0543   LDLCALC 72 06/09/2014 0543   Lab Results  Component Value Date   HGBA1C 7.2* 06/07/2014   No results found for: LABOPIA, COCAINSCRNUR, LABBENZ, AMPHETMU, THCU, LABBARB  No results for input(s): ETH in the last 168 hours.  Ct Head Wo Contrast  06/08/2014   CLINICAL DATA:  Aphasia.  Sudden onset altered mental status.  EXAM: CT HEAD WITHOUT CONTRAST  TECHNIQUE: Contiguous axial images were obtained from the base of the skull through the vertex without intravenous contrast.  COMPARISON:  None.  FINDINGS: The ventricles and sulci are within normal limits for age. There is no evidence of acute infarct, intracranial hemorrhage, mass, midline shift, or extra-axial collection.  The orbits are unremarkable. Visualized paranasal sinuses are clear. There is a small right mastoid effusion. There is no evidence of acute fracture.  IMPRESSION: Unremarkable CT appearance of the brain.  These results were called by telephone at the time of interpretation on 06/08/2014 at 11:15 am to  Dr. Janann Colonel, who verbally acknowledged these results.   Electronically Signed   By: Logan Bores   On: 06/08/2014 11:17   Mr [name] Head Wo Contrast  06/09/2014   CLINICAL DATA:  Altered mental status, and difficulty speaking. Assess for stroke. History of hypertension, diabetes.  EXAM: MRI HEAD WITHOUT CONTRAST  MRA HEAD WITHOUT CONTRAST  TECHNIQUE: Multiplanar, multiecho pulse sequences of the brain and surrounding structures were obtained without intravenous contrast. Angiographic images of the head  were obtained using MRA technique without contrast.  COMPARISON:  CT of the head June 08, 2014 at 10:55 a.m.  FINDINGS: MRI HEAD FINDINGS  Mild motion degraded examination.  Patchy areas are reduced diffusion within LEFT parietal lobe evolving may deep white matter to the cortex. Corresponding low ADC values the mild FLAIR hyperintense signal. No susceptibility artifact to suggest hemorrhage.  Ventricles and sulci normal for patient's age. No midline shift, mass effect or mass lesions. Small inferior basal ganglia perivascular spaces. Remote small LEFT cerebellar infarct.  No abnormal extra-axial fluid collections. Crescentic T2 hyperintense signal within the LEFT vertebral artery V4 segment without correlating abnormality on MRA.  Ocular globes and orbital contents are unremarkable. No paranasal sinus air-fluid levels. Trace mastoid effusions. No abnormal sellar expansion. No cerebellar tonsillar ectopia. No suspicious calvarial bone marrow signal.  MRA HEAD FINDINGS  Mild motion degraded examination.  Anterior circulation: Normal flow related enhancement of the included cervical, petrous, cavernous and supra clinoid internal carotid arteries. Patent anterior communicating artery. Normal flow related enhancement of the anterior and middle cerebral arteries, including more distal segments.  No large vessel occlusion, high-grade stenosis, abnormal luminal irregularity, aneurysm.  Posterior circulation: LEFT vertebral artery is dominant. Basilar artery is patent, with normal flow related enhancement of the main branch vessels. Normal flow related enhancement of the posterior cerebral arteries.  No large vessel occlusion, high-grade stenosis, abnormal luminal irregularity, aneurysm.  IMPRESSION: MRI HEAD: Mild motion degraded examination. Acute small to moderate LEFT middle cerebral artery territory infarct (LEFT parietal lobe).  Remote small LEFT cerebellar infarct.  MRA HEAD: Mild motion degraded examination,  essentially normal MRA of the intracranial vessels.   Electronically Signed   By: Elon Alas   On: 06/09/2014 04:49   Mr Brain Wo Contrast  06/09/2014   CLINICAL DATA:  Altered mental status, and difficulty speaking. Assess for stroke. History of hypertension, diabetes.  EXAM: MRI HEAD WITHOUT CONTRAST  MRA HEAD WITHOUT CONTRAST  TECHNIQUE: Multiplanar, multiecho pulse sequences of the brain and surrounding structures were obtained without intravenous contrast. Angiographic images of the head were obtained using MRA technique without contrast.  COMPARISON:  CT of the head June 08, 2014 at 10:55 a.m.  FINDINGS: MRI HEAD FINDINGS  Mild motion degraded examination.  Patchy areas are reduced diffusion within LEFT parietal lobe evolving may deep white matter to the cortex. Corresponding low ADC values the mild FLAIR hyperintense signal. No susceptibility artifact to suggest hemorrhage.  Ventricles and sulci normal for patient's age. No midline shift, mass effect or mass lesions. Small inferior basal ganglia perivascular spaces. Remote small LEFT cerebellar infarct.  No abnormal extra-axial fluid collections. Crescentic T2 hyperintense signal within the LEFT vertebral artery V4 segment without correlating abnormality on MRA.  Ocular globes and orbital contents are unremarkable. No paranasal sinus air-fluid levels. Trace mastoid effusions. No abnormal sellar expansion. No cerebellar tonsillar ectopia. No suspicious calvarial bone marrow signal.  MRA HEAD FINDINGS  Mild motion degraded examination.  Anterior circulation: Normal flow related enhancement of  the included cervical, petrous, cavernous and supra clinoid internal carotid arteries. Patent anterior communicating artery. Normal flow related enhancement of the anterior and middle cerebral arteries, including more distal segments.  No large vessel occlusion, high-grade stenosis, abnormal luminal irregularity, aneurysm.  Posterior circulation: LEFT  vertebral artery is dominant. Basilar artery is patent, with normal flow related enhancement of the main branch vessels. Normal flow related enhancement of the posterior cerebral arteries.  No large vessel occlusion, high-grade stenosis, abnormal luminal irregularity, aneurysm.  IMPRESSION: MRI HEAD: Mild motion degraded examination. Acute small to moderate LEFT middle cerebral artery territory infarct (LEFT parietal lobe).  Remote small LEFT cerebellar infarct.  MRA HEAD: Mild motion degraded examination, essentially normal MRA of the intracranial vessels.   Electronically Signed   By: Elon Alas   On: 06/09/2014 04:49   2D Echocardiogram  EF 55-60% with no source of embolus.    PHYSICAL EXAM Obese middle aged Caucasian male not in distress. . Afebrile. Head is nontraumatic. Neck is supple without bruit.    Cardiac exam no murmur or gallop. Lungs are clear to auscultation. Distal pulses are well felt.Rt leg dressing from recent knee surgery Neurological Exam :  Awake  Alert oriented x 3. Normal speech and language.eye movements full without nystagmus.fundi were not visualized. Vision acuity and fields appear normal. Hearing is normal. Palatal movements are normal. Face symmetric. Tongue midline. Normal strength, tone, reflexes and coordination. Rt leg movements limited by recent surgery.Normal sensation. Gait deferred. ASSESSMENT/PLAN Mr. REVERE MAAHS is a 69 y.o. male with history of of HTN, DM, CKD s/p right total knee arthroplasty this admission (06/07/2014) who developed aphasia and slurred speech. He did not receive IV t-PA due to symptom resolution and post-op status.   Stroke:  left MCA parietal infarct, embolic secondary to  Unidentified source.  Resultant  Aphasia and slurred speech resolved  MRI  L MCA parietal infarct  MRA  Unremarkable   Carotid Doppler  pending   2D Echo  No source of embolus   Lovenox 40 mg sq daily for VTE prophylaxis  Diet Carb Modified thin  liquids  aspirin 81 mg orally every day prior to admission, now on aspirin 325 mg orally every day  Ongoing aggressive stroke risk factor management  Therapy recommendations:  HH PT w/ RW  Disposition:  Likely home  Hypertension  Stable  Hyperlipidemia  Home meds:  crestor 5 resumed in hospital  LDL 72, goal < 70  Continue statin at discharge  Diabetes type 2  HgbA1c 7.2, goal < 7.0  Uncontrolled  Other Stroke Risk Factors  Advanced age  ETOH use  Obesity, Body mass index is 38.94 kg/(m^2).   Obstructive sleep apnea, on CPAP at home  Other Active Problems  R total knee arthroplasty 06/07/2014  Hospital day # 2  I have personally examined this patient, reviewed notes, independently viewed imaging studies, participated in medical decision making and plan of care. I have made any additions or clarifications directly to the above note. Agree with note above. He had transient speech difficulties and confusion from a  left parietal middle cerebral artery branch embolic infarct the source of which is to be determined. He remains at risk for recurrent strokes and TIAs and agree with ongoing stroke evaluation and aggressive risk factor modification. Continue aspirin 2 tablets twice daily for now for DVT prophylaxis post knee surgery. He will likely need a TEE and loop recorder implant but this can be arranged as outpatient. Antony Contras, MD  Medical Director [facility] Pager: [phone] 06/09/2014 1:17 PM     To contact Stroke Continuity provider, please refer to [email or web address] After hours, contact General Neurology

## 2014-06-09 NOTE — Progress Notes (Signed)
Physical Therapy Treatment Patient Details Name: Eddie Zimmerman MRN: [id number] DOB: January 26, 1946 Today's Date: 06/09/2014    History of Present Illness   Pt. S/p R TKA on 06/07/14.  Suspected TIA during PT initial session on 06/08/14 am.  Pt. Transferred to 4N from 5 N and is having TIA workup.  Have received PT re-orders.      Clinical Impressions                                          Uneventful session this am.  Pt. With general recall of some events of yesterday am.  No symptoms of aphasia this am.  Needinf  +2 assist for transitional movements.  Progressing slowly but steadily.  See in pm.  Follow Up Recommendations  Home health PT;Supervision/Assistance - 24 hour     Equipment Recommendations  Rolling walker with 5" wheels    Recommendations for Other Services       Precautions / Restrictions Precautions Precautions: Knee Precaution Booklet Issued: Yes (comment) Precaution Comments: Provided written exercise/precaution handout and reviewed with pt.  Emphasis on no pillow under right knee Restrictions Weight Bearing Restrictions: Yes RLE Weight Bearing: Weight bearing as tolerated    Mobility  Bed Mobility Overal bed mobility: Needs Assistance Bed Mobility: Supine to Sit     Supine to sit: Min assist     General bed mobility comments: Min assist needed for moving R LE to EOB.  Instructed pt. in use of gait belt to assist R LE.  Pt. able to manage to bring upper body up to sitting poition  Transfers Overall transfer level: Needs assistance Equipment used: Rolling walker (2 wheeled) Transfers: Sit to/from Stand Sit to Stand: Min assist;+2 physical assistance         General transfer comment: Pt. needed +2 min assist for power up from bed  Ambulation/Gait Ambulation/Gait assistance: Min assist;+2 safety/equipment Ambulation Distance (Feet): 60 Feet Assistive device: Rolling walker (2 wheeled) Gait Pattern/deviations: Step-to pattern;Trunk flexed Gait  velocity: decreased   General Gait Details: Pt. needed initial sequencing cues .  Educated on knee extension in stance phase and for quad activation   Stairs            Wheelchair Mobility    Modified Rankin (Stroke Patients Only)       Balance Overall balance assessment: Needs assistance Sitting-balance support: No upper extremity supported;Feet supported Sitting balance-Leahy Scale: Good     Standing balance support: Bilateral upper extremity supported;During functional activity Standing balance-Leahy Scale: Poor Standing balance comment: relies on RW for support in standing                    Cognition Arousal/Alertness: Awake/alert Behavior During Therapy: WFL for tasks assessed/performed Overall Cognitive Status: Within Functional Limits for tasks assessed                      Exercises Total Joint Exercises Ankle Circles/Pumps: AROM;Both;10 reps Quad Sets: AROM;Both;10 reps Short Arc QuadSinclair Ship;Right;10 reps Straight Leg Raises: AAROM;Right;10 reps Knee Flexion: AROM;AAROM;Right;5 reps;Seated Goniometric ROM: ~ 10 to 70    General Comments        Pertinent Vitals/Pain Pain Assessment: 0-10 Pain Score: 9  Pain Location: right knee Pain Descriptors / Indicators: Aching;Sore Pain Intervention(s): Limited activity within patient's tolerance;Monitored during session;Repositioned;Patient requesting pain meds-RN notified    Home Living  Prior Function            PT Goals (current goals can now be found in the care plan section) Progress towards PT goals: Progressing toward goals    Frequency  7X/week    PT Plan Current plan remains appropriate    Co-evaluation             End of Session Equipment Utilized During Treatment: Gait belt Activity Tolerance: Patient tolerated treatment well Patient left: in chair;with call bell/phone within reach;with chair alarm set     Time: 0930-1004 PT Time  Calculation (min) (ACUTE ONLY): 34 min  Charges:  $Gait Training: 8-22 mins $Therapeutic Exercise: 8-22 mins                    G Codes:      Ladona Ridgel 06/09/2014, 10:27 AM Gerlean Ren PT [facility] [phone] [place] [phone]

## 2014-06-09 NOTE — Progress Notes (Signed)
PATIENT ID: Eddie Zimmerman  MRN: [id number]  DOB/AGE:  [birth year] / 69 y.o.  2 Days Post-Op Procedure(s) (LRB): TOTAL KNEE ARTHROPLASTY (Right)    PROGRESS NOTE Subjective: Patient is alert, oriented, no Nausea, no Vomiting, yes passing gas, no Bowel Movement. Taking PO well with small bites. Denies SOB, Chest or Calf Pain. Using Incentive Spirometer, PAS in place. Ambulate WBAT, CPM 0-40 Patient reports pain as 6 on 0-10 scale and 8 on 0-10 scale  .    Objective: Vital signs in last 24 hours: Filed Vitals:   06/09/14 0142 06/09/14 0400 06/09/14 0552 06/09/14 1006  BP: 102/50  116/57 131/71  Pulse: 59  60 64  Temp: 97.7 F (36.5 C)  98.5 F (36.9 C) 100.6 F (38.1 C)  TempSrc: Oral  Oral Oral  Resp: 18 18 18 20   Height:      Weight:      SpO2: 95% 95% 95% 95%      Intake/Output from previous day: I/O last 3 completed shifts: In: 1520 [P.O.:720; I.V.:800] Out: 1650 [Urine:1550; Drains:100]   Intake/Output this shift: Total I/O In: -  Out: 600 [Urine:600]   LABORATORY DATA:  Recent Labs  06/08/14 0719  06/08/14 1134 06/08/14 2322 06/09/14 0543 06/09/14 0656 06/09/14 0710  WBC 9.1  --   --   --  10.2  --   --   HGB 13.5  --   --   --  12.7*  --   --   HCT 41.7  --   --   --  39.3  --   --   PLT 152  --   --   --  134*  --   --   NA 137  --   --   --   --   --  135  K 3.8  --   --   --   --   --  3.5  CL 103  --   --   --   --   --  103  CO2 26  --   --   --   --   --  25  BUN 10  --   --   --   --   --  8  CREATININE 0.92  --   --   --   --   --  0.97  GLUCOSE 174*  --   --   --   --   --  201*  GLUCAP  --   < > 158* 156*  --  167*  --   CALCIUM 8.5  --   --   --   --   --  8.2*  < > = values in this interval not displayed.  Examination: Neurologically intact Neurovascular intact Sensation intact distally Intact pulses distally Dorsiflexion/Plantar flexion intact Incision: dressing C/D/I No cellulitis present Compartment soft}  Assessment:   2  Days Post-Op Procedure(s) (LRB): TOTAL KNEE ARTHROPLASTY (Right) ADDITIONAL DIAGNOSIS: Expected Acute Blood Loss Anemia, gout, Diabetes, HTN  Aphasia and slurred speech with TIA confirmed by MRI.  Pt is followed by medicine and neuro.    Plan: PT/OT WBAT, CPM 5/hrs day until ROM 0-90 degrees, then D/C CPM DVT Prophylaxis:  SCDx72hrs, ASA 325 mg BID x 2 weeks DISCHARGE PLAN: Home, when cleared by medicine/neuro and they will handle discharge. DISCHARGE NEEDS: HHPT, HHRN, CPM, Walker and 3-in-1 comode seat     Eddie Zimmerman R 06/09/2014, 11:15 AM

## 2014-06-09 NOTE — Evaluation (Signed)
Occupational Therapy Evaluation Patient Details Name: Eddie Zimmerman MRN: [id number] DOB: [birth year] Today's Date: 06/09/2014    History of Present Illness Pt. admitted 06/07/14 for R TKA, had suspected TIA on 06/08/14   Clinical Impression   Pt admitted with above. He demonstrates the below listed deficits and will benefit from continued OT to maximize safety and independence with BADLs.  Pt presents to OT with cognitive deficits including: poor safety awareness, poor awareness of deficits, impaired problem solving, and impaired memory (stated he practiced stairs with PT when there is no documentation of such).  He required mod A for LB ADLs, and mod A - min guard for sit to stand transitions.  He required max verbal cues for hand placement and walker safety with repetition for carry over.  Wife very concerned about her ability to handle him at home (pending discharge today).  Reviewed PT notes, and notified MD of concerns that pt is unsafe to discharge home today.       Follow Up Recommendations  Home health OT;Supervision/Assistance - 24 hour    Equipment Recommendations       Recommendations for Other Services       Precautions / Restrictions Precautions Precautions: Knee Restrictions Weight Bearing Restrictions: Yes RLE Weight Bearing: Weight bearing as tolerated      Mobility Bed Mobility Overal bed mobility: Needs Assistance Bed Mobility: Supine to Sit     Supine to sit: Min assist     General bed mobility comments: Pt requires min A for management of Rt LE. Pt able to use sheet/towel looped around Rt LE to assist with bed mobility   Transfers Overall transfer level: Needs assistance Equipment used: Rolling walker (2 wheeled) Transfers: Sit to/from [facility] Sit to Stand: Mod assist;Min guard Stand pivot transfers: Min assist;Min guard       General transfer comment: Pt initially required mod A to move sit to stand from bed.  Pt with very  poor walker safety.  Wife stated "what am I supposed to do if he falls at home?"  Practiced sit to stand with pt x 5 with pt progressing to min guard assist.  Pt required consistent cuing for hand placement until last two attempts at sit to stand     Balance Overall balance assessment: Needs assistance Sitting-balance support: Feet supported Sitting balance-Leahy Scale: Good     Standing balance support: Bilateral upper extremity supported Standing balance-Leahy Scale: Poor Standing balance comment: Reliant on RW for support                             ADL Overall ADL's : Needs assistance/impaired Eating/Feeding: Independent;Bed level   Grooming: Wash/dry hands;Wash/dry face;Brushing hair;Minimal assistance;Standing   Upper Body Bathing: Supervision/ safety;Sitting   Lower Body Bathing: Minimal assistance;Sit to/from stand   Upper Body Dressing : Supervision/safety;Sitting   Lower Body Dressing: Moderate assistance;Sit to/from stand Lower Body Dressing Details (indicate cue type and reason): Pt requires assist to don sock and shoe on Rt LE, and assist for sit to stand  Toilet Transfer: Minimal assistance;Ambulation;Comfort height toilet;BSC Toilet Transfer Details (indicate cue type and reason): Pt requires min verbal cues for safety and min A for balance and walker use  Toileting- Clothing Manipulation and Hygiene: Minimal assistance;Sit to/from stand       Functional mobility during ADLs: Minimal assistance;Rolling walker General ADL Comments: Wife present.  Encouraged pt and wife to allow pt to attempt LB ADLs  before wife assisting him as it will facilitate knee flexion      Vision Additional Comments: Vision not assessed due to safety issues and pt with pending discharge.  Discharge was cancelled with pt becoming very angry at OT and refusing to work further   Perception     Praxis      Pertinent Vitals/Pain Pain Score: 5  Pain Location: Right knee Pain  Descriptors / Indicators: Aching Pain Intervention(s): Monitored during session;Repositioned     Hand Dominance Left   Extremity/Trunk Assessment Upper Extremity Assessment Upper Extremity Assessment: Overall WFL for tasks assessed   Lower Extremity Assessment Lower Extremity Assessment: Defer to PT evaluation       Communication     Cognition Arousal/Alertness: Awake/alert Behavior During Therapy:  (irritable) Overall Cognitive Status: Impaired/Different from baseline Area of Impairment: Memory;Safety/judgement;Awareness;Problem solving     Memory: Decreased short-term memory   Safety/Judgement: Decreased awareness of safety;Decreased awareness of deficits Awareness: Intellectual Problem Solving: Difficulty sequencing;Requires verbal cues;Requires tactile cues General Comments: Pt informed OT that PT had practiced steps with him, even verbalizing the (wrong) sequence, when he did not practice steps.  Pt unable to recall hand placement on walker even after cues and reminders provided (this info had been covered by PT), and demonstrates poor safety awareness.   Pt at times answers questions inappropriately and when questioned will attempt to make an excuse that is not logical    General Comments       Exercises       Shoulder Instructions      Home Living Family/patient expects to be discharged to:: Private residence Living Arrangements: Spouse/significant other Available Help at Discharge: Family;Available 24 hours/day Type of Home: House Home Access: Stairs to enter [employer] of Steps: 4 Entrance Stairs-Rails: None Home Layout: Two level;Able to live on main level with bedroom/bathroom     Bathroom Shower/Tub: Tub/shower unit;Curtain Shower/tub characteristics: Architectural technologist: Standard Bathroom Accessibility: Yes How Accessible: Accessible via walker Home Equipment: [place] - 2 wheels;Bedside commode          Prior  Functioning/Environment Level of Independence: Independent             OT Diagnosis: Generalized weakness;Cognitive deficits;Acute pain   OT Problem List: Decreased strength;Decreased activity tolerance;Impaired balance (sitting and/or standing);Decreased cognition;Decreased safety awareness;Decreased knowledge of use of DME or AE;Decreased knowledge of precautions;Pain;Obesity   OT Treatment/Interventions: Self-care/ADL training;DME and/or AE instruction;Therapeutic activities;Cognitive remediation/compensation;Visual/perceptual remediation/compensation;Patient/family education;Balance training    OT Goals(Current goals can be found in the care plan section) Acute Rehab OT Goals Patient Stated Goal: wife's goal is for pt to return home safely  OT Goal Formulation: With patient/family Time For Goal Achievement: 06/16/14 Potential to Achieve Goals: Good ADL Goals Pt Will Transfer to Toilet: with supervision;ambulating;regular height toilet;bedside commode;grab bars Pt Will Perform Toileting - Clothing Manipulation and hygiene: with supervision;sit to/from stand Additional ADL Goal #1: Pt will demonstrate good safety awareness with no cues therapeutic activities and BADLs   OT Frequency: Min 2X/week   Barriers to D/C:            Co-evaluation              End of Session    Activity Tolerance:   Patient left:     Time: 0623-7628 OT Time Calculation (min): 38 min Charges:  OT General Charges $OT Visit: 1 Procedure OT Evaluation $Initial OT Evaluation Tier I: 1 Procedure OT Treatments $Self Care/Home Management : 23-37 mins G-Codes:    Tikesha Mort,  Ellard Artis M 06/09/2014, 6:56 PM

## 2014-06-10 DIAGNOSIS — M1711 Unilateral primary osteoarthritis, right knee: Secondary | ICD-10-CM | POA: Diagnosis not present

## 2014-06-10 LAB — URINALYSIS, ROUTINE W REFLEX MICROSCOPIC
Bilirubin Urine: NEGATIVE
Glucose, UA: NEGATIVE mg/dL
Hgb urine dipstick: NEGATIVE
KETONES UR: 15 mg/dL — AB
LEUKOCYTES UA: NEGATIVE
Nitrite: NEGATIVE
Protein, ur: NEGATIVE mg/dL
Specific Gravity, Urine: 1.015 (ref 1.005–1.030)
Urobilinogen, UA: 1 mg/dL (ref 0.0–1.0)
pH: 6 (ref 5.0–8.0)

## 2014-06-10 LAB — GLUCOSE, CAPILLARY
Glucose-Capillary: 131 mg/dL — ABNORMAL HIGH (ref 70–99)
Glucose-Capillary: 137 mg/dL — ABNORMAL HIGH (ref 70–99)

## 2014-06-10 LAB — CBC
HEMATOCRIT: 35.9 % — AB (ref 39.0–52.0)
Hemoglobin: 11.5 g/dL — ABNORMAL LOW (ref 13.0–17.0)
MCH: 30.1 pg (ref 26.0–34.0)
MCHC: 32 g/dL (ref 30.0–36.0)
MCV: 94 fL (ref 78.0–100.0)
Platelets: 125 10*3/uL — ABNORMAL LOW (ref 150–400)
RBC: 3.82 MIL/uL — ABNORMAL LOW (ref 4.22–5.81)
RDW: 13.9 % (ref 11.5–15.5)
WBC: 8.6 10*3/uL (ref 4.0–10.5)

## 2014-06-10 NOTE — Progress Notes (Addendum)
Occupational Therapy Treatment Patient Details Name: Eddie Zimmerman MRN: [id number] DOB: 1945/07/17 Today's Date: 06/10/2014    History of present illness Pt. admitted 06/07/14 for R TKA, had suspected TIA on 06/08/14   OT comments  Pt progressing. Education provided in session. Pt planning to d/c home today.  Follow Up Recommendations  Home health OT;Supervision/Assistance - 24 hour    Equipment Recommendations  None recommended by OT    Recommendations for Other Services      Precautions / Restrictions Precautions Precautions: Knee Precaution Comments: educated on precautions Restrictions Weight Bearing Restrictions: Yes RLE Weight Bearing: Weight bearing as tolerated       Mobility Bed Mobility     General bed mobility comments: not assessed  Transfers Overall transfer level: Needs assistance Equipment used: Rolling walker (2 wheeled) Transfers: Sit to/from Stand Sit to Stand: Min guard         General transfer comment: cues for technique.    Balance  No LOB in session.                                 ADL Overall ADL's : Needs assistance/impaired     Grooming: Wash/dry hands;Wash/dry face;Oral care;Standing;Supervision/safety               Lower Body Dressing: Minimal assistance;Sitting/lateral leans (Rt sock)   Toilet Transfer: Min guard;Ambulation;RW (chair)           Functional mobility during ADLs: Min guard;Rolling walker General ADL Comments: Educated on LB dressing technique. Educated on safety such as safe shoewear, use of bag on walker, sitting for most of LB ADLs and suggested having walker in front when he stands to pull up LB clothing. Recommended pt have spouse be with him for tub or shower transfer and to let home health address this with him prior to him doing it at home.  Explained benefit of reaching down to Rt LE for LB ADLs as it allows knee to bend.  Discussed turning with ambulation and how to avoid twisting  right knee. Mentioned how sockaid is available.      Vision                     Perception     Praxis      Cognition  Awake/Alert Behavior During Therapy: WFL for tasks assessed/performed Overall Cognitive Status: Impaired/Different from baseline Area of Impairment: Problem solving;Memory     Memory: Decreased short-term memory      Problem Solving: Slow processing     Extremity/Trunk Assessment               Exercises     Shoulder Instructions       General Comments      Pertinent Vitals/ Pain       Pain Assessment: 0-10 Pain Score: 7  Pain Location: Rt knee Pain Intervention(s): Repositioned;Monitored during session  Home Living                                          Prior Functioning/Environment              Frequency Min 2X/week     Progress Toward Goals  OT Goals(current goals can now be found in the care plan section)  Progress towards OT goals: Progressing toward goals  Acute  Rehab OT Goals Patient Stated Goal: not stated OT Goal Formulation: With patient/family Time For Goal Achievement: 06/16/14 Potential to Achieve Goals: Good ADL Goals Pt Will Transfer to Toilet: with supervision;ambulating;regular height toilet;bedside commode;grab bars Pt Will Perform Toileting - Clothing Manipulation and hygiene: with supervision;sit to/from stand Additional ADL Goal #1: Pt will demonstrate good safety awareness with no cues therapeutic activities and BADLs   Plan Discharge plan remains appropriate    Co-evaluation                 End of Session Equipment Utilized During Treatment: Gait belt;Rolling walker   Activity Tolerance Patient tolerated treatment well   Patient Left in chair;with call bell/phone within reach;with chair alarm set   Nurse Communication Mobility status        Time: 3664-4034 OT Time Calculation (min): 15 min  Charges: OT General Charges $OT Visit: 1 Procedure OT  Treatments $Self Care/Home Management : 8-22 mins  Benito Mccreedy OTR/L [phone] 06/10/2014, 12:33 PM

## 2014-06-10 NOTE — Discharge Instructions (Signed)
STROKE/TIA DISCHARGE INSTRUCTIONS SMOKING Cigarette smoking nearly doubles your risk of having a stroke & is the single most alterable risk factor  If you smoke or have smoked in the last 12 months, you are advised to quit smoking for your health.  Most of the excess cardiovascular risk related to smoking disappears within a year of stopping.  Ask you doctor about anti-smoking medications  [place] Quit Line: 1-800-QUIT NOW  Free Smoking Cessation Classes [phone]  CHOLESTEROL Know your levels; limit fat & cholesterol in your diet  Lipid Panel     Component Value Date/Time   CHOL 125 06/09/2014 0543   TRIG 80 06/09/2014 0543   HDL 37* 06/09/2014 0543   CHOLHDL 3.4 06/09/2014 0543   VLDL 16 06/09/2014 0543   LDLCALC 72 06/09/2014 0543      Many patients benefit from treatment even if their cholesterol is at goal.  Goal: Total Cholesterol (CHOL) less than 160  Goal:  Triglycerides (TRIG) less than 150  Goal:  HDL greater than 40  Goal:  LDL (LDLCALC) less than 100   BLOOD PRESSURE American Stroke Association blood pressure target is less that 120/80 mm/Hg  Your discharge blood pressure is:  BP: 128/62 mmHg  Monitor your blood pressure  Limit your salt and alcohol intake  Many individuals will require more than one medication for high blood pressure  DIABETES (A1c is a blood sugar average for last 3 months) Goal HGBA1c is under 7% (HBGA1c is blood sugar average for last 3 months)  Diabetes:  Lab Results  Component Value Date   HGBA1C 7.2* 06/07/2014     Your HGBA1c can be lowered with medications, healthy diet, and exercise.  Check your blood sugar as directed by your physician  Call your physician if you experience unexplained or low blood sugars.  PHYSICAL ACTIVITY/REHABILITATION Goal is 30 minutes at least 4 days per week  Activity: Increase activity slowly, Therapies: Physical Therapy: Home Health and Occupational Therapy: Home Health   Activity decreases your  risk of heart attack and stroke and makes your heart stronger.  It helps control your weight and blood pressure; helps you relax and can improve your mood.  Participate in a regular exercise program.  Talk with your doctor about the best form of exercise for you (dancing, walking, swimming, cycling).  DIET/WEIGHT Goal is to maintain a healthy weight  Your discharge diet is: Diet Carb Modified Diet Carb Modified , thin liquids Your height is:  Height: 5\' 9"  (175.3 cm) Your current weight is: Weight: 119.659 kg (263 lb 12.8 oz) Your Body Mass Index (BMI) is:  BMI (Calculated): 39  Following the type of diet specifically designed for you will help prevent another stroke.  Your goal Body Mass Index (BMI) is 19-24.  Healthy food habits can help reduce 3 risk factors for stroke:  High cholesterol, hypertension, and excess weight.  RESOURCES Stroke/Support Group:  Call [phone]   STROKE EDUCATION PROVIDED/REVIEWED AND GIVEN TO PATIENT Stroke warning signs and symptoms How to activate emergency medical system (call 911). Medications prescribed at discharge. Need for follow-up after discharge. Personal risk factors for stroke. Pneumonia vaccine given: Yes, Date 06/10/14 Flu vaccine given: No My questions have been answered, the writing is legible, and I understand these instructions.  I will adhere to these goals & educational materials that have been provided to me after my discharge from the hospital.   Total Knee Replacement Total knee replacement is a procedure to replace your knee joint with  an artificial knee joint (prosthetic knee joint). The purpose of this surgery is to reduce pain and improve your knee function. LET [facility] CARE PROVIDER KNOW ABOUT:   Any allergies you have.  All medicines you are taking, including vitamins, herbs, eye drops, creams, and over-the-counter medicines.  Previous problems you or members of your family have had with the use of anesthetics.  Any  blood disorders you have.  Previous surgeries you have had.  Medical conditions you have. RISKS AND COMPLICATIONS  Generally, total knee replacement is a safe procedure. However, problems can occur and include:  Loss of range of motion of the knee or instability.  Loosening of the prosthesis.  Infection.  Persistent pain. BEFORE THE PROCEDURE   Do not eat or drink anything after midnight on the night before the procedure or as directed by your health care provider.  Ask your health care provider about changing or stopping your regular medicines. This is especially important if you are taking diabetes medicines or blood thinners. PROCEDURE   Just before the procedure, you will receive medicine that will make you drowsy (sedative). This will be given through a tube that is inserted into one of your veins (IV tube).  Then you will be given one of the following:  A medicine injected into your spine that numbs your body below the waist (spinal anesthetic).  A medicine that makes you fall asleep (general anesthetic).  You may also receive medicine to block feeling in your leg (nerve block) to help ease pain after surgery.  An incision will be made in your knee. Your surgeon will take out any damaged cartilage and bone by sawing off the damaged surfaces.  The surgeon will then put a new metal liner over the sawed-off portion of your thigh bone (femur) and a plastic liner over the sawed-off portion of one of the bones of your lower leg (tibia). This is to restore alignment and function to your knee. A plastic piece is often used to restore the surface of your knee cap. AFTER THE PROCEDURE   You will be taken to the recovery area.  You may have drainage tubes to drain excess fluid from your knee. These tubes attach to a device that removes these fluids.  Once you are awake, stable, and taking fluids well, you will be taken to your hospital room.  You will receive physical therapy as  prescribed by your health care provider.  Your surgeon may recommend that you spend time (usually an additional 10-14 days) in an extended-care facility to help you begin walking again and improve your range of motion before you go home.  You may also be prescribed blood-thinning medicine to decrease your risk of developing blood clots in your leg. Document Released: 07/21/2000 Document Revised: 08/29/2013 Document Reviewed: 05/25/2011 [facility] Patient Information 2015 [place], [place]. This information is not intended to replace advice given to you by your health care provider. Make sure you discuss any questions you have with your health care provider.

## 2014-06-10 NOTE — Progress Notes (Signed)
Subjective: 3 Days Post-Op Procedure(s) (LRB): TOTAL KNEE ARTHROPLASTY (Right) Patient reports pain as moderate.  Taken by mouth and voiding okay.  Making progress with physical therapy.  No further speech difficulties.He reports that he feels well overall.  Objective: Vital signs in last 24 hours: Temp:  [98.5 F (36.9 C)-100.6 F (38.1 C)] 98.5 F (36.9 C) (02/13 0438) Pulse Rate:  [61-70] 61 (02/13 0438) Resp:  [16-20] 18 (02/13 0438) BP: (111-131)/(62-71) 111/62 mmHg (02/13 0438) SpO2:  [90 %-96 %] 93 % (02/13 0438)  Intake/Output from previous day: 02/12 0701 - 02/13 0700 In: -  Out: 1775 [Urine:1775] Intake/Output this shift:     Recent Labs  06/08/14 0719 06/09/14 0543 06/10/14 0500  HGB 13.5 12.7* 11.5*    Recent Labs  06/09/14 0543 06/10/14 0500  WBC 10.2 8.6  RBC 4.25 3.82*  HCT 39.3 35.9*  PLT 134* 125*    Recent Labs  06/08/14 0719 06/09/14 0710  NA 137 135  K 3.8 3.5  CL 103 103  CO2 26 25  BUN 10 8  CREATININE 0.92 0.97  GLUCOSE 174* 201*  CALCIUM 8.5 8.2*   No results for input(s): LABPT, INR in the last 72 hours. Right knee exam: Neurovascular intact Sensation intact distally Intact pulses distally Dorsiflexion/Plantar flexion intact Incision: dressing C/D/I Compartment soft  Assessment/Plan: 3 Days Post-Op Procedure(s) (LRB): TOTAL KNEE ARTHROPLASTY (Right)  Plan: Up with therapy Discharge home with home health when stable medically. Prescriptions have been written.  We will do aspirin325 mg twice daily x2 weeks.  He will need home health PT, home CPM, walker, and 3 in 1 commode seat. Followup with Dr. Mayer Camel in 2 weeks.  We will see the patient daily is still in the hospital. Weight-bear as tolerated on the right.  Sedan G 06/10/2014, 9:35 AM

## 2014-06-10 NOTE — Progress Notes (Signed)
Patient discharged home with wife. DME equipment & prescriptions with patient. RN discussed discharge instructions and medications with patient. Patient and wife state they understand both sets of instructions. Handout and teach back regarding s/sx of stroke with patient.

## 2014-06-10 NOTE — Progress Notes (Signed)
Carotid dopplers 06/09/2014 - Bilateral: 1-39% ICA stenosis. Vertebral artery flow is antegrade.  Workup is now complete.  The stroke team will sign off at this time. Please call if we can be of further service.  Follow-up Dr. Leonie Man or Dr. Jaynee Eagles in 1 to 2 months  Mikey Bussing PA-C [facility] Pager [phone] 06/10/2014, 9:54 AM

## 2014-06-10 NOTE — Discharge Summary (Signed)
Physician Discharge Summary  Patient ID: Eddie Zimmerman MRN: [id number] DOB/AGE: 1945-09-11 69 y.o.  Admit date: 06/07/2014 Discharge date: 06/10/2014  Primary Care Physician:  Kandice Hams, MD  Discharge Diagnoses:     Acute CVA  . Primary osteoarthritis of right knee status post total knee replacement  . hyperlipidemia  . diabetes mellitus  . Obesity (BMI 30-39.9) . Hypertension  Consults:  Orthopedics, Dr. Mayer Camel Neurology, Dr. Leonie Man  Recommendations for Outpatient Follow-up:  Please follow hemoglobin A1c closely, 7.2, patient just had acute CVA needs good glycemic control  Please continue risk factor modification for secondary stroke prevention  Per orthopedist recommendation he will continue aspirin 325 mg twice a day for 2 weeks and then subsequently aspirin 325 mg daily thereafter for stroke prevention  TESTS THAT NEED FOLLOW-UP Hemoglobin A1c   DIET: Carb modified    Allergies:  No Known Allergies   Discharge Medications:   Medication List    STOP taking these medications        aspirin 81 MG tablet  Replaced by:  aspirin EC 325 MG tablet     HYDROcodone-acetaminophen 5-325 MG per tablet  Commonly known as:  NORCO/VICODIN      TAKE these medications        allopurinol 300 MG tablet  Commonly known as:  ZYLOPRIM  Take 300 mg by mouth daily at 12 noon.     aspirin EC 325 MG tablet  Take 1 tablet (325 mg total) by mouth 2 (two) times daily. Please take two times a day for 2 weeks, then once daily thereafter     B COMPLEX PO  Take 1 tablet by mouth daily at 12 noon.     Co Q10 100 MG Caps  Take 200 mg by mouth daily.     docusate sodium 100 MG capsule  Commonly known as:  COLACE  Take 1 capsule (100 mg total) by mouth 2 (two) times daily as needed for mild constipation.     fish oil-omega-3 fatty acids 1000 MG capsule  Take 2 g by mouth daily at 12 noon.     gabapentin 600 MG tablet  Commonly known as:  NEURONTIN  Take 600 mg by mouth 3  (three) times daily.     LEXAPRO 10 MG tablet  Generic drug:  escitalopram  Take 10 mg by mouth daily at 12 noon.     lisinopril 20 MG tablet  Commonly known as:  PRINIVIL,ZESTRIL  take 1 tablet by mouth once daily     methocarbamol 500 MG tablet  Commonly known as:  ROBAXIN  Take 1 tablet (500 mg total) by mouth 2 (two) times daily with a meal.     MULTIVITAMIN PO  Take 1 tablet by mouth daily at 12 noon.     Naproxen Sodium 220 MG Caps  Take 220-440 mg by mouth 2 (two) times daily as needed.     oxyCODONE-acetaminophen 5-325 MG per tablet  Commonly known as:  ROXICET  Take 1 tablet by mouth every 4 (four) hours as needed.     pioglitazone 15 MG tablet  Commonly known as:  ACTOS  Take 15 mg by mouth daily at 12 noon.     rosuvastatin 5 MG tablet  Commonly known as:  CRESTOR  Take 5 mg by mouth daily at 12 noon.         Brief H and P: For complete details please refer to admission H and P, but in brief 69 year old male past history  of hypertension, diabetes mellitus admitted for right total knee arthroplasty where surgery occurred without incident.  Patient was admitted under orthopedic service, Dr Mayer Camel. On 2/11, postop day 1, patient was noted by physical therapy to initially speak some gibberish, possibly slurred speech. This lasted for less than 10 minutes. Code stroke was called and initial CT scan unrevealing. Patient had actually no recollection of this event. He stated otherwise been feeling fine. Internal medicine consult was obtained and stroke workup was initiated. Hospitalist service assumed care on 06/09/14.   Hospital Course:   Acute CVA: Dysarthria resolved, was not administered TPA secondary to resolution of the symptoms CT of the head was unremarkable. MRI of the brain showed a left MCA parietal infarct likely embolic. MRA was unremarkable 2-D echo showed no source of embolus Carotid Dopplers showed 1-39% ICA stenosis Hemoglobin A1c 7.2, needs outpatient  tight glycemic control LDL 72, continue Crestor PTOT evaluation recommended home health PT Patient was not on any antithrombotic prior to admission, currently recommended aspirin 325 mg twice a day for 2 weeks and then continue aspirin 325 mg daily Patient will need TEE for workup of the cryptogenic stroke, cardiology service was called Wannetta Sender), will set up outpatient TEE, patient will be called with appointment.     Primary osteoarthritis of right kneeStatus post total knee replacement Postop day #3, patient was followed closely by orthopedics. Home PT OT arranged and DME needs arranged by the case management    Obesity (BMI 30-39.9) Patient recommended diet and weight control    Diabetes mellitus without complication -Hemoglobin A1c 7.2 needs tight outpatient glycemic control    Hypertension Currently stable  Low-grade fever: Postop likely due to atelectasis , chest x-ray negative for pneumonia, UA negative for UTI., Resolved   Day of Discharge BP 128/62 mmHg  Pulse 62  Temp(Src) 98.3 F (36.8 C) (Oral)  Resp 20  Ht 5\' 9"  (1.753 m)  Wt 119.659 kg (263 lb 12.8 oz)  BMI 38.94 kg/m2  SpO2 94%  Physical Exam: General: Alert and awake oriented x3 not in any acute distress. CVS: S1-S2 clear no murmur rubs or gallops Chest: clear to auscultation bilaterally, no wheezing rales or rhonchi Abdomen: soft nontender, nondistended, normal bowel sounds Extremities: no cyanosis, clubbing, right lower extremity in dressing  Neuro: Cranial nerves II-XII intact, no focal neurological deficits, no dysarthria, unable to assess right lower extremity secondary to knee surgery   The results of significant diagnostics from this hospitalization (including imaging, microbiology, ancillary and laboratory) are listed below for reference.    LAB RESULTS: Basic Metabolic Panel:  Recent Labs Lab 06/08/14 0719 06/09/14 0710  NA 137 135  K 3.8 3.5  CL 103 103  CO2 26 25  GLUCOSE 174* 201*  BUN  10 8  CREATININE 0.92 0.97  CALCIUM 8.5 8.2*   Liver Function Tests: No results for input(s): AST, ALT, ALKPHOS, BILITOT, PROT, ALBUMIN in the last 168 hours. No results for input(s): LIPASE, AMYLASE in the last 168 hours. No results for input(s): AMMONIA in the last 168 hours. CBC:  Recent Labs Lab 06/09/14 0543 06/10/14 0500  WBC 10.2 8.6  HGB 12.7* 11.5*  HCT 39.3 35.9*  MCV 92.5 94.0  PLT 134* 125*   Cardiac Enzymes: No results for input(s): CKTOTAL, CKMB, CKMBINDEX, TROPONINI in the last 168 hours. BNP: Invalid input(s): POCBNP CBG:  Recent Labs Lab 06/10/14 0641 06/10/14 1128  GLUCAP 131* 137*    Significant Diagnostic Studies:  Dg Chest 2 View  05/29/2014  CLINICAL DATA:  Hypertension.  EXAM: CHEST  2 VIEW  COMPARISON:  02/2010.  FINDINGS: Mediastinum and hilar structures are normal. Cardiomegaly with normal pulmonary vascularity. Mild lingular infiltrate cannot be excluded. No pleural effusion or pneumothorax. Catheter is noted over the left upper abdomen.  IMPRESSION: 1. Mild lingular infiltrate cannot be excluded. 2. Stable cardiomegaly .   Electronically Signed   By: Marcello Moores  Register   On: 05/29/2014 09:46    2D ECHO: Study Conclusions  - Left ventricle: The cavity size was normal. Wall thickness was increased in a pattern of moderate LVH. Systolic function was normal. The estimated ejection fraction was in the range of 55% to 60%. - Left atrium: The atrium was mildly dilated. - Atrial septum: No defect or patent foramen ovale was identified. - Pulmonary arteries: PA peak pressure: 33 mm Hg (S).  Disposition and Follow-up: Discharge Instructions    Diet Carb Modified    Complete by:  As directed      Increase activity slowly    Complete by:  As directed             DISPOSITION: Home with home health PT  DISCHARGE FOLLOW-UP Follow-up Information    Follow up with Kerin Salen, MD In 2 weeks.   Specialty:  Orthopedic Surgery   Contact  information:   [place] [address] [phone]       Please follow up.   Why:  Office will call you to set up outpatient TEE (transesophageal echo).    Contact information:   [facility]      Follow up with SETHI,PRAMOD, MD. Schedule an appointment as soon as possible for a visit in 1 month.   Specialties:  Neurology, Radiology   Why:  for hospital follow-up/STROKE   Contact information:   [address] [place] [address] [phone]        Time spent on Discharge: 40 minutes  Signed:   RAI,RIPUDEEP M.D. [facility] 06/10/2014, 11:41 AM Pager: [phone]

## 2014-06-10 NOTE — Progress Notes (Signed)
Physical Therapy Treatment Patient Details Name: Eddie Zimmerman MRN: [id number] DOB: [birth year] Today's Date: 06/10/2014    History of Present Illness Pt. admitted 06/07/14 for R TKA, had suspected TIA on 06/08/14    PT Comments    Patient showing some marked improvement this AM. Still some slowdown on processing information but overall did very well. Patient able to practice steps and use teach back method to instruct tech as if she was assisting him at home. OT made aware that patient was discharging today. Patient safe to D/C from a mobility standpoint based on progression towards goals set on PT eval and assistance available on DC    Follow Up Recommendations  Home health PT;Supervision/Assistance - 24 hour     Equipment Recommendations  Rolling walker with 5" wheels    Recommendations for Other Services       Precautions / Restrictions Precautions Precautions: Knee Restrictions Weight Bearing Restrictions: Yes RLE Weight Bearing: Weight bearing as tolerated    Mobility  Bed Mobility Overal bed mobility: Needs Assistance       Supine to sit: Min assist     General bed mobility comments: Pt requires min A for management of Rt LE. Pt able to use sheet/towel looped around Rt LE to assist with bed mobility   Transfers Overall transfer level: Needs assistance Equipment used: Rolling walker (2 wheeled)   Sit to Stand: Min guard         General transfer comment: Minguard to ensure safety with stand. Cues for hand placement and not to pull up on RW. Able to recall safe hand placement with sitting  Ambulation/Gait Ambulation/Gait assistance: Min guard Ambulation Distance (Feet): 100 Feet (40) Assistive device: Rolling walker (2 wheeled) Gait Pattern/deviations: Step-to pattern;Decreased stance time - right;Decreased step length - left;Antalgic Gait velocity: decreased   General Gait Details: Gait initially antalgic but progressed as ambulation increased. Cues for  safety with RW but overall great progression of gait   Stairs Stairs: Yes Stairs assistance: Min assist Stair Management: Step to pattern;Backwards;With walker;No rails Number of Stairs: 4 General stair comments: Patient able to practice two steps x2. First set patient instructed and cued for sequence and technique. On second attempt had patient use teach back to tech and patient did so with no difficulty and recalled exact technique  Wheelchair Mobility    Modified Rankin (Stroke Patients Only)       Balance                                    Cognition Arousal/Alertness: Awake/alert   Overall Cognitive Status: Impaired/Different from baseline Area of Impairment: Safety/judgement         Safety/Judgement: Decreased awareness of safety   Problem Solving: Slow processing General Comments: Patient with some slowness to process information but in time did well. Cues for some safety with use of RW. Patient able to follow sequency for steps and teach back/ instruct back with no errors    Exercises      General Comments        Pertinent Vitals/Pain Pain Score: 4  Pain Location: Rt knee Pain Descriptors / Indicators: Aching;Sore Pain Intervention(s): Monitored during session    Home Living                      Prior Function            PT Goals (  current goals can now be found in the care plan section) Progress towards PT goals: Progressing toward goals    Frequency  7X/week    PT Plan Current plan remains appropriate    Co-evaluation             End of Session Equipment Utilized During Treatment: Gait belt Activity Tolerance: Patient tolerated treatment well Patient left: in chair;with call bell/phone within reach     Time: 1040-1111 PT Time Calculation (min) (ACUTE ONLY): 31 min  Charges:  $Gait Training: 8-22 mins $Therapeutic Activity: 8-22 mins                    G Codes:      Jacqualyn Posey 06/10/2014, 11:20 AM 06/10/2014 Jacqualyn Posey PTA [phone] pager [phone] office

## 2014-06-11 DIAGNOSIS — I129 Hypertensive chronic kidney disease with stage 1 through stage 4 chronic kidney disease, or unspecified chronic kidney disease: Secondary | ICD-10-CM | POA: Diagnosis not present

## 2014-06-11 DIAGNOSIS — M109 Gout, unspecified: Secondary | ICD-10-CM | POA: Diagnosis not present

## 2014-06-11 DIAGNOSIS — E119 Type 2 diabetes mellitus without complications: Secondary | ICD-10-CM | POA: Diagnosis not present

## 2014-06-11 DIAGNOSIS — Z471 Aftercare following joint replacement surgery: Secondary | ICD-10-CM | POA: Diagnosis not present

## 2014-06-11 DIAGNOSIS — N189 Chronic kidney disease, unspecified: Secondary | ICD-10-CM | POA: Diagnosis not present

## 2014-06-11 DIAGNOSIS — M159 Polyosteoarthritis, unspecified: Secondary | ICD-10-CM | POA: Diagnosis not present

## 2014-06-11 LAB — URINE CULTURE
COLONY COUNT: NO GROWTH
Culture: NO GROWTH

## 2014-06-12 ENCOUNTER — Telehealth: Payer: Self-pay | Admitting: Physician Assistant

## 2014-06-12 ENCOUNTER — Other Ambulatory Visit: Payer: Self-pay | Admitting: Physician Assistant

## 2014-06-12 NOTE — Telephone Encounter (Signed)
Reviewed TEE with patient and his wife.     [facility] has been requested to perform a transesophageal echocardiogram on 02/17 for CVA.  After careful review of history and examination, the risks and benefits of transesophageal echocardiogram have been explained including risks of esophageal damage, perforation (1:10,000 risk), bleeding, pharyngeal hematoma as well as other potential complications associated with conscious sedation including aspiration, arrhythmia, respiratory failure and death. Alternatives to treatment were discussed, questions were answered. Patient is willing to proceed.   Rosaria Ferries, PA-C 06/12/2014 12:18 PM

## 2014-06-13 DIAGNOSIS — Z471 Aftercare following joint replacement surgery: Secondary | ICD-10-CM | POA: Diagnosis not present

## 2014-06-13 DIAGNOSIS — I129 Hypertensive chronic kidney disease with stage 1 through stage 4 chronic kidney disease, or unspecified chronic kidney disease: Secondary | ICD-10-CM | POA: Diagnosis not present

## 2014-06-13 DIAGNOSIS — E119 Type 2 diabetes mellitus without complications: Secondary | ICD-10-CM | POA: Diagnosis not present

## 2014-06-13 DIAGNOSIS — M109 Gout, unspecified: Secondary | ICD-10-CM | POA: Diagnosis not present

## 2014-06-13 DIAGNOSIS — N189 Chronic kidney disease, unspecified: Secondary | ICD-10-CM | POA: Diagnosis not present

## 2014-06-13 DIAGNOSIS — M159 Polyosteoarthritis, unspecified: Secondary | ICD-10-CM | POA: Diagnosis not present

## 2014-06-14 ENCOUNTER — Ambulatory Visit (HOSPITAL_COMMUNITY)
Admission: AD | Admit: 2014-06-14 | Discharge: 2014-06-14 | Disposition: A | Discharge status: Home / Self Care | Payer: Medicare Other | Source: Ambulatory Visit | Attending: Cardiology | Admitting: Cardiology

## 2014-06-14 ENCOUNTER — Encounter (HOSPITAL_COMMUNITY): Payer: Self-pay | Admitting: *Deleted

## 2014-06-14 ENCOUNTER — Encounter (HOSPITAL_COMMUNITY)
Admission: AD | Disposition: A | Discharge status: Home / Self Care | Payer: Medicare Other | Source: Ambulatory Visit | Attending: Cardiology

## 2014-06-14 DIAGNOSIS — I351 Nonrheumatic aortic (valve) insufficiency: Secondary | ICD-10-CM | POA: Insufficient documentation

## 2014-06-14 DIAGNOSIS — I10 Essential (primary) hypertension: Secondary | ICD-10-CM | POA: Insufficient documentation

## 2014-06-14 DIAGNOSIS — M179 Osteoarthritis of knee, unspecified: Secondary | ICD-10-CM | POA: Insufficient documentation

## 2014-06-14 DIAGNOSIS — I639 Cerebral infarction, unspecified: Secondary | ICD-10-CM

## 2014-06-14 DIAGNOSIS — I340 Nonrheumatic mitral (valve) insufficiency: Secondary | ICD-10-CM | POA: Diagnosis not present

## 2014-06-14 DIAGNOSIS — E782 Mixed hyperlipidemia: Secondary | ICD-10-CM | POA: Diagnosis not present

## 2014-06-14 DIAGNOSIS — I421 Obstructive hypertrophic cardiomyopathy: Secondary | ICD-10-CM | POA: Insufficient documentation

## 2014-06-14 DIAGNOSIS — Z8673 Personal history of transient ischemic attack (TIA), and cerebral infarction without residual deficits: Secondary | ICD-10-CM | POA: Diagnosis not present

## 2014-06-14 DIAGNOSIS — G4733 Obstructive sleep apnea (adult) (pediatric): Secondary | ICD-10-CM | POA: Insufficient documentation

## 2014-06-14 DIAGNOSIS — E119 Type 2 diabetes mellitus without complications: Secondary | ICD-10-CM | POA: Diagnosis not present

## 2014-06-14 HISTORY — PX: TEE WITHOUT CARDIOVERSION: SHX5443

## 2014-06-14 HISTORY — DX: Cerebral infarction, unspecified: I63.9

## 2014-06-14 LAB — GLUCOSE, CAPILLARY: Glucose-Capillary: 125 mg/dL — ABNORMAL HIGH (ref 70–99)

## 2014-06-14 SURGERY — ECHOCARDIOGRAM, TRANSESOPHAGEAL
Anesthesia: Moderate Sedation

## 2014-06-14 MED ORDER — BUTAMBEN-TETRACAINE-BENZOCAINE 2-2-14 % EX AERO
INHALATION_SPRAY | CUTANEOUS | Status: DC | PRN
  Administered 2014-06-14: 2 via TOPICAL

## 2014-06-14 MED ORDER — FENTANYL CITRATE 0.05 MG/ML IJ SOLN
INTRAMUSCULAR | Status: AC
  Filled 2014-06-14: qty 2

## 2014-06-14 MED ORDER — FENTANYL CITRATE 0.05 MG/ML IJ SOLN
INTRAMUSCULAR | Status: DC | PRN
  Administered 2014-06-14: 25 ug via INTRAVENOUS
  Administered 2014-06-14 (×2): 12.5 ug via INTRAVENOUS

## 2014-06-14 MED ORDER — LIDOCAINE VISCOUS 2 % MT SOLN
OROMUCOSAL | Status: DC | PRN
  Administered 2014-06-14: 15 mL via OROMUCOSAL

## 2014-06-14 MED ORDER — SODIUM CHLORIDE 0.9 % IV SOLN
INTRAVENOUS | Status: DC
  Administered 2014-06-14: 11:00:00 via INTRAVENOUS

## 2014-06-14 MED ORDER — MIDAZOLAM HCL 10 MG/2ML IJ SOLN
INTRAMUSCULAR | Status: DC | PRN
  Administered 2014-06-14 (×2): 1 mg via INTRAVENOUS
  Administered 2014-06-14 (×2): 2 mg via INTRAVENOUS

## 2014-06-14 MED ORDER — LIDOCAINE VISCOUS 2 % MT SOLN
OROMUCOSAL | Status: AC
  Filled 2014-06-14: qty 15

## 2014-06-14 MED ORDER — MIDAZOLAM HCL 5 MG/ML IJ SOLN
INTRAMUSCULAR | Status: AC
  Filled 2014-06-14: qty 2

## 2014-06-14 NOTE — Progress Notes (Signed)
  Echocardiogram 2D Echocardiogram has been performed.  Joelene Millin 06/14/2014, 12:16 PM

## 2014-06-14 NOTE — Discharge Instructions (Signed)
Transesophageal Echocardiogram °Transesophageal echocardiography (TEE) is a special type of test that produces images of the heart by using sound waves (echocardiogram). This type of echocardiography can obtain better images of the heart than standard echocardiography. TEE is done by passing a flexible tube down the esophagus. The heart is located in front of the esophagus. Because the heart and esophagus are close to one another, your health care provider can take very clear, detailed pictures of the heart via ultrasound waves. °TEE may be done: °· If your health care provider needs more information based on standard echocardiography findings. °· If you had a stroke. This might have happened because a clot formed in your heart. TEE can visualize different areas of the heart and check for clots. °· To check valve anatomy and function. °· To check for infection on the inside of your heart (endocarditis). °· To evaluate the dividing wall (septum) of the heart and presence of a hole that did not close after birth (patent foramen ovale or atrial septal defect). °· To help diagnose a tear in the wall of the aorta (aortic dissection). °· During cardiac valve surgery. This allows the surgeon to assess the valve repair before closing the chest. °· During a variety of other cardiac procedures to guide positioning of catheters. °· Sometimes before a cardioversion, which is a shock to convert heart rhythm back to normal. °LET YOUR HEALTH CARE PROVIDER KNOW ABOUT:  °· Any allergies you have. °· All medicines you are taking, including vitamins, herbs, eye drops, creams, and over-the-counter medicines. °· Previous problems you or members of your family have had with the use of anesthetics. °· Any blood disorders you have. °· Previous surgeries you have had. °· Medical conditions you have. °· Swallowing difficulties. °· An esophageal obstruction. °RISKS AND COMPLICATIONS  °Generally, TEE is a safe procedure. However, as with any  procedure, complications can occur. Possible complications include an esophageal tear (rupture). °BEFORE THE PROCEDURE  °· Do not eat or drink for 6 hours before the procedure or as directed by your health care provider. °· Arrange for someone to drive you home after the procedure. Do not drive yourself home. During the procedure, you will be given medicines that can continue to make you feel drowsy and can impair your reflexes. °· An IV access tube will be started in the arm. °PROCEDURE  °· A medicine to help you relax (sedative) will be given through the IV access tube. °· A medicine may be sprayed or gargled to numb the back of the throat. °· Your blood pressure, heart rate, and breathing (vital signs) will be monitored during the procedure. °· The TEE probe is a long, flexible tube. The tip of the probe is placed into the back of the mouth, and you will be asked to swallow. This helps to pass the tip of the probe into the esophagus. Once the tip of the probe is in the correct area, your health care provider can take pictures of the heart. °· TEE is usually not a painful procedure. You may feel the probe press against the back of the throat. The probe does not enter the trachea and does not affect your breathing. °AFTER THE PROCEDURE  °· You will be in bed, resting, until you have fully returned to consciousness. °· When you first awaken, your throat may feel slightly sore and will probably still feel numb. This will improve slowly over time. °· You will not be allowed to eat or drink until it   is clear that the numbness has improved.  Once you have been able to drink, urinate, and sit on the edge of the bed without feeling sick to your stomach (nausea) or dizzy, you may be cleared to go home.  You should have a friend or family member with you for the next 24 hours after your procedure. Document Released: 07/05/2002 Document Revised: 04/19/2013 Document Reviewed: 10/14/2012 [facility] Patient Information  2015 [place], [place]. This information is not intended to replace advice given to you by your health care provider. Make sure you discuss any questions you have with your health care provider.  Conscious Sedation, Adult, Care After Refer to this sheet in the next few weeks. These instructions provide you with information on caring for yourself after your procedure. Your health care provider may also give you more specific instructions. Your treatment has been planned according to current medical practices, but problems sometimes occur. Call your health care provider if you have any problems or questions after your procedure. WHAT TO EXPECT AFTER THE PROCEDURE  After your procedure:  You may feel sleepy, clumsy, and have poor balance for several hours.  Vomiting may occur if you eat too soon after the procedure. HOME CARE INSTRUCTIONS  Do not participate in any activities where you could become injured for at least 24 hours. Do not:  Drive.  Swim.  Ride a bicycle.  Operate heavy machinery.  Cook.  Use power tools.  Climb ladders.  Work from a high place.  Do not make important decisions or sign legal documents until you are improved.  If you vomit, drink water, juice, or soup when you can drink without vomiting. Make sure you have little or no nausea before eating solid foods.  Only take over-the-counter or prescription medicines for pain, discomfort, or fever as directed by your health care provider.  Make sure you and your family fully understand everything about the medicines given to you, including what side effects may occur.  You should not drink alcohol, take sleeping pills, or take medicines that cause drowsiness for at least 24 hours.  If you smoke, do not smoke without supervision.  If you are feeling better, you may resume normal activities 24 hours after you were sedated.  Keep all appointments with your health care provider. SEEK MEDICAL CARE IF:  Your skin  is pale or bluish in color.  You continue to feel nauseous or vomit.  Your pain is getting worse and is not helped by medicine.  You have bleeding or swelling.  You are still sleepy or feeling clumsy after 24 hours. SEEK IMMEDIATE MEDICAL CARE IF:  You develop a rash.  You have difficulty breathing.  You develop any type of allergic problem.  You have a fever. MAKE SURE YOU:  Understand these instructions.  Will watch your condition.  Will get help right away if you are not doing well or get worse. Document Released: 02/02/2013 Document Reviewed: 02/02/2013 [facility] Patient Information 2015 [place], [place]. This information is not intended to replace advice given to you by your health care provider. Make sure you discuss any questions you have with your health care provider.

## 2014-06-14 NOTE — H&P (View-Only) (Signed)
TOTAL KNEE ADMISSION H&P  Patient is being admitted for right total knee arthroplasty.  Subjective:  Chief Complaint:right knee pain.  HPI: Eddie Zimmerman, 69 y.o. male, has a history of pain and functional disability in the right knee due to arthritis and has failed non-surgical conservative treatments for greater than 12 weeks to includeNSAID's and/or analgesics, corticosteriod injections, viscosupplementation injections, flexibility and strengthening excercises, weight reduction as appropriate and activity modification.  Onset of symptoms was gradual, starting 2 years ago with gradually worsening course since that time. The patient noted no past surgery on the right knee(s).  Patient currently rates pain in the right knee(s) at 10 out of 10 with activity. Patient has night pain, worsening of pain with activity and weight bearing, pain that interferes with activities of daily living, pain with passive range of motion, crepitus and joint swelling.  Patient has evidence of subchondral sclerosis, periarticular osteophytes and joint space narrowing by imaging studies.  There is no active infection.  Patient Active Problem List   Diagnosis Date Noted  . Preoperative cardiovascular examination 04/10/2014  . Hypertrophic obstructive cardiomyopathy 04/07/2013  . Mixed hyperlipidemia 04/07/2013  . Essential hypertension, benign 04/07/2013  . Obstructive sleep apnea 04/07/2013   Past Medical History  Diagnosis Date  . Diabetes mellitus without complication   . Hypertension   . Gout   . History of kidney stones   . Sleep apnea     cpap use  . Anxiety   . Depression   . Chronic kidney disease     renal calculi - treated /w lithotripsy, has also passed many spontaneously  . Arthritis     hands, wrist & Knee     Past Surgical History  Procedure Laterality Date  . Laparoscopic gastric banding    . Knee surgery Left   . Hernia repair      abdominal  . Joint replacement Left   .  Tonsillectomy    . Arthroscopic repair pcl Bilateral     right- x1, left- x3     No prescriptions prior to admission   No Known Allergies  History  Substance Use Topics  . Smoking status: Never Smoker   . Smokeless tobacco: Never Used  . Alcohol Use: Yes     Comment: rare    Family History  Problem Relation Age of Onset  . AAA (abdominal aortic aneurysm) Mother   . Alzheimer's disease Father   . Cirrhosis Brother     of the liver  . AAA (abdominal aortic aneurysm) Brother      Review of Systems  Constitutional: Negative.   HENT: Negative.   Eyes: Negative.   Respiratory: Negative.   Cardiovascular: Negative.   Gastrointestinal: Negative.   Genitourinary: Negative.   Musculoskeletal: Positive for joint pain.  Skin: Negative.   Neurological: Negative.   Endo/Heme/Allergies: Negative.   Psychiatric/Behavioral: Negative.     Objective:  Physical Exam  Constitutional: He is oriented to person, place, and time. He appears well-developed and well-nourished.  HENT:  Head: Normocephalic and atraumatic.  Eyes: Pupils are equal, round, and reactive to light.  Neck: Normal range of motion. Neck supple.  Cardiovascular: Intact distal pulses.   Respiratory: Effort normal.  Musculoskeletal: He exhibits tenderness.  the patient's right knee has a range from 0 to approximately 100.  No instability.  He does have a mild effusion.  No erythema or warmth.  Patient does have tenderness over the medial and mild lateral joint line tenderness.  His calves are  soft and nontender.  He is neurovascularly intact distally.  Patient's left knee has a range from approximately 0-90.  Neurological: He is alert and oriented to person, place, and time.  Skin: Skin is warm and dry.  Psychiatric: He has a normal mood and affect. His behavior is normal. Judgment and thought content normal.    Vital signs in last 24 hours:    Labs:   Estimated body mass index is 37.31 kg/(m^2) as calculated  from the following:   Height as of 04/10/14: 5\' 10"  (1.778 m).   Weight as of 04/10/14: 117.935 kg (260 lb).   Imaging Review Plain radiographs demonstrate early subluxation of the tibia lateral to the femur by about 4 mm.    Assessment/Plan:  End stage arthritis, right knee   The patient history, physical examination, clinical judgment of the provider and imaging studies are consistent with end stage degenerative joint disease of the right knee(s) and total knee arthroplasty is deemed medically necessary. The treatment options including medical management, injection therapy arthroscopy and arthroplasty were discussed at length. The risks and benefits of total knee arthroplasty were presented and reviewed. The risks due to aseptic loosening, infection, stiffness, patella tracking problems, thromboembolic complications and other imponderables were discussed. The patient acknowledged the explanation, agreed to proceed with the plan and consent was signed. Patient is being admitted for inpatient treatment for surgery, pain control, PT, OT, prophylactic antibiotics, VTE prophylaxis, progressive ambulation and ADL's and discharge planning. The patient is planning to be discharged home with home health services

## 2014-06-14 NOTE — Interval H&P Note (Signed)
History and Physical Interval Note:  06/14/2014 11:14 AM  Eddie Zimmerman  has presented today for surgery, with the diagnosis of stroke  The various methods of treatment have been discussed with the patient and family. After consideration of risks, benefits and other options for treatment, the patient has consented to  Procedure(s): TRANSESOPHAGEAL ECHOCARDIOGRAM (TEE) (N/A) as a surgical intervention .  The patient's history has been reviewed, patient examined, no change in status, stable for surgery.  I have reviewed the patient's chart and labs.  Questions were answered to the patient's satisfaction.     TURNER,TRACI R

## 2014-06-14 NOTE — CV Procedure (Signed)
PROCEDURE NOTE  Procedure:  Transesophageal echocardiogram Operator:  Fransico Him, MD Indications:  CVA Complications:  None IV Meds:  Versed 6mg , Fentanyl 27mcg IV.  Results: Normal LV size and function Normal RV size and function Normal RA Normal LA and LA appendage with no thrombus Normal TV Normal PV with trivial PR Normal MV with mild MR Trileaflet AV with mild thickening and calcification and mild to moderate eccentric aortic regurgitation directed at the anterior mitral valve leaflet.  Normal Thoracic and ascending aorta Normal interatrial septum with no evidence of PFO by colorflow doppler or agitated saline contrast injection.  The patient tolerated the procedure well and was set back to his room in stable condition.  Signed: Fransico Him, MD [facility] 06/14/2014

## 2014-06-15 ENCOUNTER — Encounter (HOSPITAL_COMMUNITY): Payer: Self-pay | Admitting: Cardiology

## 2014-06-15 DIAGNOSIS — I129 Hypertensive chronic kidney disease with stage 1 through stage 4 chronic kidney disease, or unspecified chronic kidney disease: Secondary | ICD-10-CM | POA: Diagnosis not present

## 2014-06-15 DIAGNOSIS — N189 Chronic kidney disease, unspecified: Secondary | ICD-10-CM | POA: Diagnosis not present

## 2014-06-15 DIAGNOSIS — M159 Polyosteoarthritis, unspecified: Secondary | ICD-10-CM | POA: Diagnosis not present

## 2014-06-15 DIAGNOSIS — M109 Gout, unspecified: Secondary | ICD-10-CM | POA: Diagnosis not present

## 2014-06-15 DIAGNOSIS — E119 Type 2 diabetes mellitus without complications: Secondary | ICD-10-CM | POA: Diagnosis not present

## 2014-06-15 DIAGNOSIS — Z471 Aftercare following joint replacement surgery: Secondary | ICD-10-CM | POA: Diagnosis not present

## 2014-06-16 ENCOUNTER — Telehealth: Payer: Self-pay | Admitting: Cardiology

## 2014-06-16 DIAGNOSIS — M109 Gout, unspecified: Secondary | ICD-10-CM | POA: Diagnosis not present

## 2014-06-16 DIAGNOSIS — M159 Polyosteoarthritis, unspecified: Secondary | ICD-10-CM | POA: Diagnosis not present

## 2014-06-16 DIAGNOSIS — E119 Type 2 diabetes mellitus without complications: Secondary | ICD-10-CM | POA: Diagnosis not present

## 2014-06-16 DIAGNOSIS — N189 Chronic kidney disease, unspecified: Secondary | ICD-10-CM | POA: Diagnosis not present

## 2014-06-16 DIAGNOSIS — Z471 Aftercare following joint replacement surgery: Secondary | ICD-10-CM | POA: Diagnosis not present

## 2014-06-16 DIAGNOSIS — I129 Hypertensive chronic kidney disease with stage 1 through stage 4 chronic kidney disease, or unspecified chronic kidney disease: Secondary | ICD-10-CM | POA: Diagnosis not present

## 2014-06-16 NOTE — Telephone Encounter (Signed)
Patient had cryptogenic CVA and no source of embolism on TEE - please refer to EP for loop recorder.  I have already discussed this with him

## 2014-06-16 NOTE — Telephone Encounter (Signed)
Referral placed for EP for loop recorder placement. Lorenda Hatchet notified for scheduling.

## 2014-06-19 DIAGNOSIS — N189 Chronic kidney disease, unspecified: Secondary | ICD-10-CM | POA: Diagnosis not present

## 2014-06-19 DIAGNOSIS — Z471 Aftercare following joint replacement surgery: Secondary | ICD-10-CM | POA: Diagnosis not present

## 2014-06-19 DIAGNOSIS — E119 Type 2 diabetes mellitus without complications: Secondary | ICD-10-CM | POA: Diagnosis not present

## 2014-06-19 DIAGNOSIS — M159 Polyosteoarthritis, unspecified: Secondary | ICD-10-CM | POA: Diagnosis not present

## 2014-06-19 DIAGNOSIS — I129 Hypertensive chronic kidney disease with stage 1 through stage 4 chronic kidney disease, or unspecified chronic kidney disease: Secondary | ICD-10-CM | POA: Diagnosis not present

## 2014-06-19 DIAGNOSIS — M109 Gout, unspecified: Secondary | ICD-10-CM | POA: Diagnosis not present

## 2014-06-19 NOTE — Telephone Encounter (Signed)
Appointment scheduled with Dr. Lovena Le 3/3.

## 2014-06-20 DIAGNOSIS — Z96651 Presence of right artificial knee joint: Secondary | ICD-10-CM | POA: Diagnosis not present

## 2014-06-20 DIAGNOSIS — Z471 Aftercare following joint replacement surgery: Secondary | ICD-10-CM | POA: Diagnosis not present

## 2014-06-21 DIAGNOSIS — M159 Polyosteoarthritis, unspecified: Secondary | ICD-10-CM | POA: Diagnosis not present

## 2014-06-21 DIAGNOSIS — N189 Chronic kidney disease, unspecified: Secondary | ICD-10-CM | POA: Diagnosis not present

## 2014-06-21 DIAGNOSIS — M109 Gout, unspecified: Secondary | ICD-10-CM | POA: Diagnosis not present

## 2014-06-21 DIAGNOSIS — Z471 Aftercare following joint replacement surgery: Secondary | ICD-10-CM | POA: Diagnosis not present

## 2014-06-21 DIAGNOSIS — I129 Hypertensive chronic kidney disease with stage 1 through stage 4 chronic kidney disease, or unspecified chronic kidney disease: Secondary | ICD-10-CM | POA: Diagnosis not present

## 2014-06-21 DIAGNOSIS — E119 Type 2 diabetes mellitus without complications: Secondary | ICD-10-CM | POA: Diagnosis not present

## 2014-06-26 DIAGNOSIS — Z96651 Presence of right artificial knee joint: Secondary | ICD-10-CM | POA: Diagnosis not present

## 2014-06-26 DIAGNOSIS — M25661 Stiffness of right knee, not elsewhere classified: Secondary | ICD-10-CM | POA: Diagnosis not present

## 2014-06-28 DIAGNOSIS — M25661 Stiffness of right knee, not elsewhere classified: Secondary | ICD-10-CM | POA: Diagnosis not present

## 2014-06-28 DIAGNOSIS — Z96651 Presence of right artificial knee joint: Secondary | ICD-10-CM | POA: Diagnosis not present

## 2014-06-29 ENCOUNTER — Encounter: Payer: Self-pay | Admitting: Internal Medicine

## 2014-06-29 ENCOUNTER — Ambulatory Visit (INDEPENDENT_AMBULATORY_CARE_PROVIDER_SITE_OTHER): Payer: Medicare Other | Admitting: Internal Medicine

## 2014-06-29 ENCOUNTER — Other Ambulatory Visit: Payer: Self-pay

## 2014-06-29 VITALS — BP 124/76 | HR 73 | Ht 70.0 in | Wt 249.1 lb

## 2014-06-29 DIAGNOSIS — I10 Essential (primary) hypertension: Secondary | ICD-10-CM | POA: Diagnosis not present

## 2014-06-29 DIAGNOSIS — M25661 Stiffness of right knee, not elsewhere classified: Secondary | ICD-10-CM | POA: Diagnosis not present

## 2014-06-29 DIAGNOSIS — E780 Pure hypercholesterolemia: Secondary | ICD-10-CM | POA: Diagnosis not present

## 2014-06-29 DIAGNOSIS — E139 Other specified diabetes mellitus without complications: Secondary | ICD-10-CM | POA: Diagnosis not present

## 2014-06-29 DIAGNOSIS — E782 Mixed hyperlipidemia: Secondary | ICD-10-CM | POA: Diagnosis not present

## 2014-06-29 DIAGNOSIS — I639 Cerebral infarction, unspecified: Secondary | ICD-10-CM

## 2014-06-29 DIAGNOSIS — Z96651 Presence of right artificial knee joint: Secondary | ICD-10-CM | POA: Diagnosis not present

## 2014-06-29 DIAGNOSIS — R0981 Nasal congestion: Secondary | ICD-10-CM | POA: Diagnosis not present

## 2014-06-29 HISTORY — DX: Cerebral infarction, unspecified: I63.9

## 2014-06-29 NOTE — Assessment & Plan Note (Signed)
His blood pressure is well controlled. He will continue his current medical therapy.

## 2014-06-29 NOTE — Patient Instructions (Signed)
LINQ Implant scheduled for 07/12/14 at 8:00am  Please arrive at [place] at 7:00am  Your physician recommends that you schedule a follow-up appointment in: 7-10 days from 07/12/14 for wound check in the device clinic

## 2014-06-29 NOTE — Assessment & Plan Note (Signed)
The etiology of his stroke is unclear. I've discussed the treatment options. The risk, goals, benefits, and expectations of implantable loop recorder insertion a been discussed with the patient and his wife. He wishes to proceed.

## 2014-06-29 NOTE — Assessment & Plan Note (Signed)
He will continue low-dose statin therapy. He is encouraged to lose weight and maintain a low-fat diet.

## 2014-06-29 NOTE — Progress Notes (Signed)
HPI [name] is referred today by Dr. Radford Pax for consideration for insertion of an implantable loop recorder. The patient is a very pleasant 69 year old man who was sustained a stroke several weeks ago. Subsequent MRI demonstrated a remote stroke which was clinically silent. His symptoms resolved within an hour. Transesophageal echo has demonstrated no obvious etiology for his stroke, and telemetry monitoring while in the hospital demonstrated no atrial arrhythmias. He is referred now to consider implantable loop recorder insertion. No Known Allergies   Current Outpatient Prescriptions  Medication Sig Dispense Refill  . allopurinol (ZYLOPRIM) 300 MG tablet Take 300 mg by mouth daily at 12 noon.     [name] aspirin EC 325 MG tablet Take 1 tablet (325 mg total) by mouth 2 (two) times daily. Please take two times a day for 2 weeks, then once daily thereafter 60 tablet 11  . B Complex Vitamins (B COMPLEX PO) Take 1 tablet by mouth daily at 12 noon.     . Coenzyme Q10 (CO Q10) 100 MG CAPS Take 200 mg by mouth daily. (Patient taking differently: Take 200 mg by mouth daily at 12 noon. ) 30 each   . docusate sodium (COLACE) 100 MG capsule Take 1 capsule (100 mg total) by mouth 2 (two) times daily as needed for mild constipation. 60 capsule 0  . escitalopram (LEXAPRO) 10 MG tablet Take 10 mg by mouth daily at 12 noon.     . fish oil-omega-3 fatty acids 1000 MG capsule Take 2 g by mouth daily at 12 noon.     . gabapentin (NEURONTIN) 600 MG tablet Take 600 mg by mouth 3 (three) times daily.  0  . lisinopril (PRINIVIL,ZESTRIL) 20 MG tablet take 1 tablet by mouth once daily (Patient taking differently: take 1 tablet by mouth once daily at noon) 60 tablet 5  . methocarbamol (ROBAXIN) 500 MG tablet Take 1 tablet (500 mg total) by mouth 2 (two) times daily with a meal. 60 tablet 0  . Multiple Vitamin (MULTIVITAMIN PO) Take 1 tablet by mouth daily at 12 noon.     [name] oxyCODONE-acetaminophen (ROXICET) 5-325 MG  per tablet Take 1 tablet by mouth every 4 (four) hours as needed. (Patient taking differently: Take 1 tablet by mouth every 4 (four) hours as needed (pain). ) 60 tablet 0  . pioglitazone (ACTOS) 15 MG tablet Take 15 mg by mouth daily at 12 noon.     . rosuvastatin (CRESTOR) 5 MG tablet Take 5 mg by mouth daily at 12 noon.    [name] amoxicillin (AMOXIL) 500 MG capsule Take 2 capsules by mouth 1 hour prior to procedure and 2 capsules 1 hour following procedure  0  . Naproxen Sodium 220 MG CAPS Take 220-440 mg by mouth 2 (two) times daily as needed (pain).      No current facility-administered medications for this visit.     Past Medical History  Diagnosis Date  . Diabetes mellitus without complication   . Hypertension   . Gout   . History of kidney stones   . Sleep apnea     cpap use  . Anxiety   . Depression   . Chronic kidney disease     renal calculi - treated /w lithotripsy, has also passed many spontaneously  . Arthritis     hands, wrist & Knee   . Stroke     ROS:   All systems reviewed and negative except as noted in the HPI.   Past  Surgical History  Procedure Laterality Date  . Laparoscopic gastric banding    . Knee surgery Left   . Hernia repair      abdominal  . Joint replacement Left   . Tonsillectomy    . Arthroscopic repair pcl Bilateral     right- x1, left- x3   . Total knee arthroplasty Right 06/07/2014    Procedure: TOTAL KNEE ARTHROPLASTY;  Surgeon: Kerin Salen, MD;  Location: [facility];  Service: Orthopedics;  Laterality: Right;  . Tarsal tunnel release Bilateral 2005  . Tee without cardioversion N/A 06/14/2014    Procedure: TRANSESOPHAGEAL ECHOCARDIOGRAM (TEE);  Surgeon: Sueanne Margarita, MD;  Location: [facility];  Service: Cardiovascular;  Laterality: N/A;     Family History  Problem Relation Age of Onset  . AAA (abdominal aortic aneurysm) Mother   . Alzheimer's disease Father   . Cirrhosis Brother     of the liver  . AAA (abdominal aortic aneurysm)  Brother      History   Social History  . Marital Status: Married    Spouse Name: N/A  . Number of Children: N/A  . Years of Education: N/A   Occupational History  . Not on file.   Social History Main Topics  . Smoking status: Never Smoker   . Smokeless tobacco: Never Used  . Alcohol Use: Yes     Comment: rare  . Drug Use: No  . Sexual Activity: Not on file   Other Topics Concern  . Not on file   Social History Narrative     BP 124/76 mmHg  Pulse 73  Ht 5\' 10"  (1.778 m)  Wt 249 lb 1.9 oz (113 kg)  BMI 35.74 kg/m2  Physical Exam:  Well appearing 69 year old man, NAD HEENT: Unremarkable Neck:  No JVD, no thyromegally Back:  No CVA tenderness Lungs:  Clear, with no wheezes, rales, or rhonchi.  HEART:  Regular rate rhythm, no murmurs, no rubs, no clicks Abd:  soft, positive bowel sounds, no organomegally, no rebound, no guarding Ext:  2 plus pulses, no edema, no cyanosis, no clubbing Skin:  No rashes no nodules Neuro:  CN II through XII intact, motor grossly intact  EKG - normal sinus rhythm   Assess/Plan:

## 2014-07-03 DIAGNOSIS — M25661 Stiffness of right knee, not elsewhere classified: Secondary | ICD-10-CM | POA: Diagnosis not present

## 2014-07-03 DIAGNOSIS — Z96651 Presence of right artificial knee joint: Secondary | ICD-10-CM | POA: Diagnosis not present

## 2014-07-05 DIAGNOSIS — M25661 Stiffness of right knee, not elsewhere classified: Secondary | ICD-10-CM | POA: Diagnosis not present

## 2014-07-05 DIAGNOSIS — Z96651 Presence of right artificial knee joint: Secondary | ICD-10-CM | POA: Diagnosis not present

## 2014-07-07 DIAGNOSIS — M25661 Stiffness of right knee, not elsewhere classified: Secondary | ICD-10-CM | POA: Diagnosis not present

## 2014-07-07 DIAGNOSIS — Z96651 Presence of right artificial knee joint: Secondary | ICD-10-CM | POA: Diagnosis not present

## 2014-07-10 DIAGNOSIS — Z96651 Presence of right artificial knee joint: Secondary | ICD-10-CM | POA: Diagnosis not present

## 2014-07-10 DIAGNOSIS — M25661 Stiffness of right knee, not elsewhere classified: Secondary | ICD-10-CM | POA: Diagnosis not present

## 2014-07-12 ENCOUNTER — Encounter (HOSPITAL_COMMUNITY): Payer: Self-pay | Admitting: Internal Medicine

## 2014-07-12 ENCOUNTER — Encounter (HOSPITAL_COMMUNITY)
Admission: RE | Disposition: A | Discharge status: Home / Self Care | Payer: Self-pay | Source: Ambulatory Visit | Attending: Internal Medicine

## 2014-07-12 ENCOUNTER — Ambulatory Visit (HOSPITAL_COMMUNITY)
Admission: RE | Admit: 2014-07-12 | Discharge: 2014-07-12 | Disposition: A | Discharge status: Home / Self Care | Payer: Medicare Other | Source: Ambulatory Visit | Attending: Internal Medicine | Admitting: Internal Medicine

## 2014-07-12 DIAGNOSIS — E119 Type 2 diabetes mellitus without complications: Secondary | ICD-10-CM | POA: Diagnosis not present

## 2014-07-12 DIAGNOSIS — N189 Chronic kidney disease, unspecified: Secondary | ICD-10-CM | POA: Insufficient documentation

## 2014-07-12 DIAGNOSIS — Z87442 Personal history of urinary calculi: Secondary | ICD-10-CM | POA: Diagnosis not present

## 2014-07-12 DIAGNOSIS — M1990 Unspecified osteoarthritis, unspecified site: Secondary | ICD-10-CM | POA: Diagnosis not present

## 2014-07-12 DIAGNOSIS — Z7982 Long term (current) use of aspirin: Secondary | ICD-10-CM | POA: Insufficient documentation

## 2014-07-12 DIAGNOSIS — Z79899 Other long term (current) drug therapy: Secondary | ICD-10-CM | POA: Diagnosis not present

## 2014-07-12 DIAGNOSIS — I129 Hypertensive chronic kidney disease with stage 1 through stage 4 chronic kidney disease, or unspecified chronic kidney disease: Secondary | ICD-10-CM | POA: Insufficient documentation

## 2014-07-12 DIAGNOSIS — M109 Gout, unspecified: Secondary | ICD-10-CM | POA: Diagnosis not present

## 2014-07-12 DIAGNOSIS — G4730 Sleep apnea, unspecified: Secondary | ICD-10-CM | POA: Insufficient documentation

## 2014-07-12 DIAGNOSIS — I639 Cerebral infarction, unspecified: Secondary | ICD-10-CM | POA: Diagnosis not present

## 2014-07-12 DIAGNOSIS — I638 Other cerebral infarction: Secondary | ICD-10-CM | POA: Insufficient documentation

## 2014-07-12 HISTORY — PX: LOOP RECORDER IMPLANT: SHX5477

## 2014-07-12 SURGERY — LOOP RECORDER IMPLANT
Anesthesia: LOCAL

## 2014-07-12 MED ORDER — LIDOCAINE-EPINEPHRINE 1 %-1:100000 IJ SOLN
INTRAMUSCULAR | Status: AC
  Filled 2014-07-12: qty 1

## 2014-07-12 NOTE — H&P (View-Only) (Signed)
HPI Eddie Zimmerman is referred today by Dr. Radford Pax for consideration for insertion of an implantable loop recorder. The patient is a very pleasant 69 year old man who was sustained a stroke several weeks ago. Subsequent MRI demonstrated a remote stroke which was clinically silent. His symptoms resolved within an hour. Transesophageal echo has demonstrated no obvious etiology for his stroke, and telemetry monitoring while in the hospital demonstrated no atrial arrhythmias. He is referred now to consider implantable loop recorder insertion. No Known Allergies   Current Outpatient Prescriptions  Medication Sig Dispense Refill  . allopurinol (ZYLOPRIM) 300 MG tablet Take 300 mg by mouth daily at 12 noon.     [name] aspirin EC 325 MG tablet Take 1 tablet (325 mg total) by mouth 2 (two) times daily. Please take two times a day for 2 weeks, then once daily thereafter 60 tablet 11  . B Complex Vitamins (B COMPLEX PO) Take 1 tablet by mouth daily at 12 noon.     . Coenzyme Q10 (CO Q10) 100 MG CAPS Take 200 mg by mouth daily. (Patient taking differently: Take 200 mg by mouth daily at 12 noon. ) 30 each   . docusate sodium (COLACE) 100 MG capsule Take 1 capsule (100 mg total) by mouth 2 (two) times daily as needed for mild constipation. 60 capsule 0  . escitalopram (LEXAPRO) 10 MG tablet Take 10 mg by mouth daily at 12 noon.     . fish oil-omega-3 fatty acids 1000 MG capsule Take 2 g by mouth daily at 12 noon.     . gabapentin (NEURONTIN) 600 MG tablet Take 600 mg by mouth 3 (three) times daily.  0  . lisinopril (PRINIVIL,ZESTRIL) 20 MG tablet take 1 tablet by mouth once daily (Patient taking differently: take 1 tablet by mouth once daily at noon) 60 tablet 5  . methocarbamol (ROBAXIN) 500 MG tablet Take 1 tablet (500 mg total) by mouth 2 (two) times daily with a meal. 60 tablet 0  . Multiple Vitamin (MULTIVITAMIN PO) Take 1 tablet by mouth daily at 12 noon.     [name] oxyCODONE-acetaminophen (ROXICET) 5-325 MG  per tablet Take 1 tablet by mouth every 4 (four) hours as needed. (Patient taking differently: Take 1 tablet by mouth every 4 (four) hours as needed (pain). ) 60 tablet 0  . pioglitazone (ACTOS) 15 MG tablet Take 15 mg by mouth daily at 12 noon.     . rosuvastatin (CRESTOR) 5 MG tablet Take 5 mg by mouth daily at 12 noon.    [name] amoxicillin (AMOXIL) 500 MG capsule Take 2 capsules by mouth 1 hour prior to procedure and 2 capsules 1 hour following procedure  0  . Naproxen Sodium 220 MG CAPS Take 220-440 mg by mouth 2 (two) times daily as needed (pain).      No current facility-administered medications for this visit.     Past Medical History  Diagnosis Date  . Diabetes mellitus without complication   . Hypertension   . Gout   . History of kidney stones   . Sleep apnea     cpap use  . Anxiety   . Depression   . Chronic kidney disease     renal calculi - treated /w lithotripsy, has also passed many spontaneously  . Arthritis     hands, wrist & Knee   . Stroke     ROS:   All systems reviewed and negative except as noted in the HPI.   Past  Surgical History  Procedure Laterality Date  . Laparoscopic gastric banding    . Knee surgery Left   . Hernia repair      abdominal  . Joint replacement Left   . Tonsillectomy    . Arthroscopic repair pcl Bilateral     right- x1, left- x3   . Total knee arthroplasty Right 06/07/2014    Procedure: TOTAL KNEE ARTHROPLASTY;  Surgeon: Kerin Salen, MD;  Location: [facility];  Service: Orthopedics;  Laterality: Right;  . Tarsal tunnel release Bilateral 2005  . Tee without cardioversion N/A 06/14/2014    Procedure: TRANSESOPHAGEAL ECHOCARDIOGRAM (TEE);  Surgeon: Sueanne Margarita, MD;  Location: [facility] END[facility]COPY;  Service: Cardiovascular;  Laterality: N/A;     Family History  Problem Relation Age of Onset  . AAA (abdominal aortic aneurysm) Mother   . Alzheimer's disease Father   . Cirrhosis Brother     of the liver  . AAA (abdominal aortic aneurysm)  Brother      History   Social History  . Marital Status: Married    Spouse Name: N/A  . Number of Children: N/A  . Years of Education: N/A   Occupational History  . Not on file.   Social History Main Topics  . Smoking status: Never Smoker   . Smokeless tobacco: Never Used  . Alcohol Use: Yes     Comment: rare  . Drug Use: No  . Sexual Activity: Not on file   Other Topics Concern  . Not on file   Social History Narrative     BP 124/76 mmHg  Pulse 73  Ht 5\' 10"  (1.778 m)  Wt 249 lb 1.9 oz (113 kg)  BMI 35.74 kg/m2  Physical Exam:  Well appearing 69 year old man, NAD HEENT: Unremarkable Neck:  No JVD, no thyromegally Back:  No CVA tenderness Lungs:  Clear, with no wheezes, rales, or rhonchi.  HEART:  Regular rate rhythm, no murmurs, no rubs, no clicks Abd:  soft, positive bowel sounds, no organomegally, no rebound, no guarding Ext:  2 plus pulses, no edema, no cyanosis, no clubbing Skin:  No rashes no nodules Neuro:  CN II through XII intact, motor grossly intact  EKG - normal sinus rhythm   Assess/Plan:

## 2014-07-12 NOTE — CV Procedure (Signed)
EP Procedure Note  Procedure: insertion of an ILR  Preoperative diagnosis: cryptogenic stroke  Postoperative diagnosis: same as preoperative diagnosis  Description of the procedure: After informed consent was obtained, the patient was prepped and draped in usual manner. 20 cc of lidocaine was infiltrated into the left pectoral region. A 1 cm stab incision was carried out. The Medtronic implantable loop recorder, serial number [id number] was inserted and the R waves measured 0.20mV. Benzoin and steri-strips were painted on the skin. The patient was returned to his room in satisfactory condition.  Complications: none  Conclusion: successful insertion of an ILR in a patient with cryptogenic stroke.   Mikle Bosworth.D.

## 2014-07-12 NOTE — Interval H&P Note (Signed)
History and Physical Interval Note:  07/12/2014 8:27 AM  Eddie Zimmerman  has presented today for surgery, with the diagnosis of cryptogenic stroke  The various methods of treatment have been discussed with the patient and family. After consideration of risks, benefits and other options for treatment, the patient has consented to  Procedure(s): LOOP RECORDER IMPLANT (N/A) as a surgical intervention .  The patient's history has been reviewed, patient examined, no change in status, stable for surgery.  I have reviewed the patient's chart and labs.  Questions were answered to the patient's satisfaction.     Mikle Bosworth.D.

## 2014-07-14 DIAGNOSIS — M25661 Stiffness of right knee, not elsewhere classified: Secondary | ICD-10-CM | POA: Diagnosis not present

## 2014-07-14 DIAGNOSIS — Z96651 Presence of right artificial knee joint: Secondary | ICD-10-CM | POA: Diagnosis not present

## 2014-07-18 DIAGNOSIS — M25661 Stiffness of right knee, not elsewhere classified: Secondary | ICD-10-CM | POA: Diagnosis not present

## 2014-07-18 DIAGNOSIS — Z96651 Presence of right artificial knee joint: Secondary | ICD-10-CM | POA: Diagnosis not present

## 2014-07-19 ENCOUNTER — Ambulatory Visit (INDEPENDENT_AMBULATORY_CARE_PROVIDER_SITE_OTHER): Payer: Medicare Other | Admitting: *Deleted

## 2014-07-19 DIAGNOSIS — M25661 Stiffness of right knee, not elsewhere classified: Secondary | ICD-10-CM | POA: Diagnosis not present

## 2014-07-19 DIAGNOSIS — Z96651 Presence of right artificial knee joint: Secondary | ICD-10-CM | POA: Diagnosis not present

## 2014-07-19 DIAGNOSIS — I639 Cerebral infarction, unspecified: Secondary | ICD-10-CM

## 2014-07-19 LAB — MDC_IDC_ENUM_SESS_TYPE_INCLINIC

## 2014-07-19 NOTE — Progress Notes (Signed)
Wound check-ILR.  Dressing removed by the patient and wound is well healed.  1 symptom recorded episode that was done in error.  No other episodes.  R-waves 0.16-0.19mV.  Follow up via [facility].

## 2014-07-24 DIAGNOSIS — M25661 Stiffness of right knee, not elsewhere classified: Secondary | ICD-10-CM | POA: Diagnosis not present

## 2014-07-24 DIAGNOSIS — Z96651 Presence of right artificial knee joint: Secondary | ICD-10-CM | POA: Diagnosis not present

## 2014-07-26 DIAGNOSIS — Z96651 Presence of right artificial knee joint: Secondary | ICD-10-CM | POA: Diagnosis not present

## 2014-07-26 DIAGNOSIS — M25661 Stiffness of right knee, not elsewhere classified: Secondary | ICD-10-CM | POA: Diagnosis not present

## 2014-07-28 ENCOUNTER — Encounter: Payer: Self-pay | Admitting: Internal Medicine

## 2014-07-28 DIAGNOSIS — Z96651 Presence of right artificial knee joint: Secondary | ICD-10-CM | POA: Diagnosis not present

## 2014-07-28 DIAGNOSIS — M25661 Stiffness of right knee, not elsewhere classified: Secondary | ICD-10-CM | POA: Diagnosis not present

## 2014-07-31 DIAGNOSIS — M25661 Stiffness of right knee, not elsewhere classified: Secondary | ICD-10-CM | POA: Diagnosis not present

## 2014-07-31 DIAGNOSIS — Z96651 Presence of right artificial knee joint: Secondary | ICD-10-CM | POA: Diagnosis not present

## 2014-08-03 DIAGNOSIS — Z96651 Presence of right artificial knee joint: Secondary | ICD-10-CM | POA: Diagnosis not present

## 2014-08-03 DIAGNOSIS — M25661 Stiffness of right knee, not elsewhere classified: Secondary | ICD-10-CM | POA: Diagnosis not present

## 2014-08-04 DIAGNOSIS — Z96651 Presence of right artificial knee joint: Secondary | ICD-10-CM | POA: Diagnosis not present

## 2014-08-04 DIAGNOSIS — M25661 Stiffness of right knee, not elsewhere classified: Secondary | ICD-10-CM | POA: Diagnosis not present

## 2014-08-11 ENCOUNTER — Ambulatory Visit (INDEPENDENT_AMBULATORY_CARE_PROVIDER_SITE_OTHER): Payer: Medicare Other | Admitting: *Deleted

## 2014-08-11 DIAGNOSIS — I639 Cerebral infarction, unspecified: Secondary | ICD-10-CM | POA: Diagnosis not present

## 2014-08-11 LAB — CUP PACEART REMOTE DEVICE CHECK: Date Time Interrogation Session: 20160601205805

## 2014-08-11 NOTE — Progress Notes (Signed)
Loop recorder 

## 2014-08-16 DIAGNOSIS — Z96651 Presence of right artificial knee joint: Secondary | ICD-10-CM | POA: Diagnosis not present

## 2014-08-16 DIAGNOSIS — M25661 Stiffness of right knee, not elsewhere classified: Secondary | ICD-10-CM | POA: Diagnosis not present

## 2014-08-21 ENCOUNTER — Encounter: Payer: Self-pay | Admitting: Neurology

## 2014-08-21 ENCOUNTER — Ambulatory Visit (INDEPENDENT_AMBULATORY_CARE_PROVIDER_SITE_OTHER): Payer: Medicare Other | Admitting: Neurology

## 2014-08-21 VITALS — BP 149/84 | HR 61 | Ht 70.0 in | Wt 256.0 lb

## 2014-08-21 DIAGNOSIS — I63512 Cerebral infarction due to unspecified occlusion or stenosis of left middle cerebral artery: Secondary | ICD-10-CM | POA: Insufficient documentation

## 2014-08-21 DIAGNOSIS — E785 Hyperlipidemia, unspecified: Secondary | ICD-10-CM | POA: Diagnosis not present

## 2014-08-21 DIAGNOSIS — E119 Type 2 diabetes mellitus without complications: Secondary | ICD-10-CM | POA: Diagnosis not present

## 2014-08-21 HISTORY — DX: Cerebral infarction due to unspecified occlusion or stenosis of left middle cerebral artery: I63.512

## 2014-08-21 HISTORY — DX: Hyperlipidemia, unspecified: E78.5

## 2014-08-21 HISTORY — DX: Type 2 diabetes mellitus without complications: E11.9

## 2014-08-21 NOTE — Patient Instructions (Signed)
Overall you are doing fairly well but I do want to suggest a few things today:   Remember to drink plenty of fluid, eat healthy meals and do not skip any meals. Try to eat protein with a every meal and eat a healthy snack such as fruit or nuts in between meals. Try to keep a regular sleep-wake schedule and try to exercise daily, particularly in the form of walking, 20-30 minutes a day, if you can.   As far as your medications are concerned, I would like to suggest: continue current medications  I would like to see you back as needed, sooner if we need to. Please call us with any interim questions, concerns, problems, updates or refill requests.    Our phone number is [phone]. We also have an after hours call service for urgent matters and there is a physician on-call for urgent questions. For any emergencies you know to call 911 or go to the nearest emergency room

## 2014-08-21 NOTE — Progress Notes (Signed)
[facility]    Provider:  Dr Jaynee Eagles Referring Provider: [name], MD Primary Care Physician:  Kandice Hams, MD  CC:  Stroke  HPI:  Eddie Zimmerman is a 69 y.o. male here as a referral from Dr. Delfina Redwood for stroke. Eddie Zimmerman is an 69 y.o. male with hx of HTN, DM, CKD admitted for right total knee arthroplasty. Surgery completed without complications and patient was in normal state of health, nurse noted patient "talking crazy", words did not make sense.  Per rapid response nurse the patient was upright upon her arrival and had continued aphasia, upon reclining the bed flat it appears the symptoms improved. TPA was not given. He had stopped baby aspirin for the surgery.   He has a loop recorder implanted. Wife noticed he was talking funny, he was able to think but couldn't get it out, said "[place]" instead of "[place]" for example. Everything is resolved. No problems with word finding. He resolved that morning.   Reviewed notes, labs and imaging from outside physicians, which showed: MRI of the brain showed. Reviewed images with patient, agree with findings.  IMPRESSION: MRI HEAD: Mild motion degraded examination. Acute small to moderate LEFT middle cerebral artery territory infarct (LEFT parietal lobe). Remote small LEFT cerebellar infarct. MRA HEAD: Mild motion degraded examination, essentially normal MRA of the intracranial vessels.  TTE/TEE did not reveal thrombus  Summary:  - Technically difficult due to bifurcation at the jaw line and excessive snoring. - Bilateral 1% to 39% ICA stenosis. Vertebral artey flow is antegrade.  CBC with  mild anemia. BMP unremarkable.   Review of Systems: Patient complains of symptoms per HPI as well as the following symptoms: hearing loss, joint pain. Pertinent negatives per HPI. All others negative.   History   Social History  . Marital Status: Married    Spouse Name: Eddie Zimmerman  . Number of  Children: 3  . Years of Education: Master's   Occupational History  . [employer]    Social History Main Topics  . Smoking status: Never Smoker   . Smokeless tobacco: Never Used  . Alcohol Use: 0.0 oz/week    0 Standard drinks or equivalent per week     Comment: rare 1-2 per month  . Drug Use: No  . Sexual Activity: Not on file   Other Topics Concern  . Not on file   Social History Narrative   Lives at home with wife.   Left handed.    Caffeine use: Drinks 1 glass coffee, tea, soda per day. Varies.    Family History  Problem Relation Age of Onset  . AAA (abdominal aortic aneurysm) Mother   . Alzheimer's disease Father   . Cirrhosis Brother     of the liver  . AAA (abdominal aortic aneurysm) Brother     Past Medical History  Diagnosis Date  . Diabetes mellitus without complication   . Hypertension   . Gout   . History of kidney stones   . Sleep apnea     cpap use  . Anxiety   . Depression   . Chronic kidney disease     renal calculi - treated /w lithotripsy, has also passed many spontaneously  . Arthritis     hands, wrist & Knee   . Stroke     Past Surgical History  Procedure Laterality Date  . Laparoscopic gastric banding    . Knee surgery Left   . Hernia repair      abdominal  .  Joint replacement Left   . Tonsillectomy    . Arthroscopic repair pcl Bilateral     right- x1, left- x3   . Total knee arthroplasty Right 06/07/2014    Procedure: TOTAL KNEE ARTHROPLASTY;  Surgeon: Kerin Salen, MD;  Location: [place];  Service: Orthopedics;  Laterality: Right;  . Tarsal tunnel release Bilateral 2005  . Tee without cardioversion N/A 06/14/2014    Procedure: TRANSESOPHAGEAL ECHOCARDIOGRAM (TEE);  Surgeon: Sueanne Margarita, MD;  Location: [place];  Service: Cardiovascular;  Laterality: N/A;  . Loop recorder implant N/A 07/12/2014    Procedure: LOOP RECORDER IMPLANT;  Surgeon: Evans Lance, MD;  Location: [facility] CATH LAB;  Service: Cardiovascular;  Laterality: N/A;     Current Outpatient Prescriptions  Medication Sig Dispense Refill  . allopurinol (ZYLOPRIM) 300 MG tablet Take 300 mg by mouth daily at 12 noon.     [name] aspirin EC 325 MG tablet Take 1 tablet (325 mg total) by mouth 2 (two) times daily. Please take two times a day for 2 weeks, then once daily thereafter 60 tablet 11  . B Complex Vitamins (B COMPLEX PO) Take 1 tablet by mouth daily at 12 noon.     . Coenzyme Q10 (CO Q10) 100 MG CAPS Take 200 mg by mouth daily. (Patient taking differently: Take 200 mg by mouth daily at 12 noon. ) 30 each   . escitalopram (LEXAPRO) 10 MG tablet Take 10 mg by mouth daily at 12 noon.     . fish oil-omega-3 fatty acids 1000 MG capsule Take 2 g by mouth daily at 12 noon.     . gabapentin (NEURONTIN) 600 MG tablet Take 600 mg by mouth 3 (three) times daily.  0  . lisinopril (PRINIVIL,ZESTRIL) 20 MG tablet take 1 tablet by mouth once daily (Patient taking differently: take 1 tablet by mouth once daily at noon) 60 tablet 5  . Multiple Vitamin (MULTIVITAMIN PO) Take 1 tablet by mouth daily at 12 noon.     [name] oxyCODONE-acetaminophen (ROXICET) 5-325 MG per tablet Take 1 tablet by mouth every 4 (four) hours as needed. (Patient taking differently: Take 1 tablet by mouth every 4 (four) hours as needed (pain). ) 60 tablet 0  . pioglitazone (ACTOS) 15 MG tablet Take 15 mg by mouth daily at 12 noon.     . rosuvastatin (CRESTOR) 5 MG tablet Take 5 mg by mouth daily at 12 noon.     No current facility-administered medications for this visit.    Allergies as of 08/21/2014  . (No Known Allergies)    Vitals: BP 149/84 mmHg  Pulse 61  Ht 5\' 10"  (1.778 m)  Wt 256 lb (116.121 kg)  BMI 36.73 kg/m2 Last Weight:  Wt Readings from Last 1 Encounters:  08/21/14 256 lb (116.121 kg)   Last Height:   Ht Readings from Last 1 Encounters:  08/21/14 5\' 10"  (1.778 m)    Physical exam: Exam: Gen: NAD, conversant, well nourised, obese, well groomed                     CV: RRR, no  MRG. No Carotid Bruits. No peripheral edema, warm, nontender Eyes: Conjunctivae clear without exudates or hemorrhage  Neuro: Detailed Neurologic Exam  Speech:    Speech is normal; fluent and spontaneous with normal comprehension.  Cognition:    The patient is oriented to person, place, and time;     recent and remote memory intact;     language  fluent;     normal attention, concentration,     fund of knowledge Cranial Nerves:    The pupils are equal, round, and reactive to light. The fundi are flat. Visual fields are full to finger confrontation. Extraocular movements are intact. Trigeminal sensation is intact and the muscles of mastication are normal. The face is symmetric. The palate elevates in the midline. Hearing intact. Voice is normal. Shoulder shrug is normal. The tongue has normal motion without fasciculations.   Coordination:    No dysmetria  Gait:    Not ataxia  Motor Observation:    No asymmetry, no atrophy, and no involuntary movements noted. Tone:    Normal muscle tone.    Posture:    Posture is normal. normal erect    Strength:    Strength is V/V in the upper and lower limbs.      Sensation: intact to LT     Reflex Exam:  DTR's:    Deep tendon reflexes in the upper and lower extremities are symmetric bilaterally.   Toes:    The toes are downgoing bilaterally.   Clonus:    Clonus is absent.      Assessment/Plan:  Eddie Zimmerman is a 69 y.o. male with history of of HTN, DM, CKD s/p right total knee arthroplasty this admission (06/07/2014) who developed aphasia and slurred speech. He did not receive IV t-PA due to symptom resolution and post-op status.  Stroke: left MCA parietal infarct, embolic secondary to Unidentified source.  Continue asa for stroke prevetion Follow closely with pcp for management of vascular risk factors LDL goal < 70 hgbA1c goal < 7 Weight loss, diet F/u with cardiology for loop recorder and further eval for  afib  Sarina Ill, MD  [facility] [address] [place], [place] [address]  Phone [phone] Fax [phone]

## 2014-08-24 ENCOUNTER — Encounter: Payer: Self-pay | Admitting: Internal Medicine

## 2014-09-11 ENCOUNTER — Ambulatory Visit (INDEPENDENT_AMBULATORY_CARE_PROVIDER_SITE_OTHER): Payer: Medicare Other | Admitting: *Deleted

## 2014-09-11 DIAGNOSIS — I639 Cerebral infarction, unspecified: Secondary | ICD-10-CM | POA: Diagnosis not present

## 2014-09-13 DIAGNOSIS — E0840 Diabetes mellitus due to underlying condition with diabetic neuropathy, unspecified: Secondary | ICD-10-CM | POA: Diagnosis not present

## 2014-09-13 DIAGNOSIS — M792 Neuralgia and neuritis, unspecified: Secondary | ICD-10-CM | POA: Diagnosis not present

## 2014-09-13 DIAGNOSIS — E780 Pure hypercholesterolemia: Secondary | ICD-10-CM | POA: Diagnosis not present

## 2014-09-13 DIAGNOSIS — Z1389 Encounter for screening for other disorder: Secondary | ICD-10-CM | POA: Diagnosis not present

## 2014-09-13 DIAGNOSIS — Z0000 Encounter for general adult medical examination without abnormal findings: Secondary | ICD-10-CM | POA: Diagnosis not present

## 2014-09-13 DIAGNOSIS — I639 Cerebral infarction, unspecified: Secondary | ICD-10-CM | POA: Diagnosis not present

## 2014-09-13 DIAGNOSIS — M109 Gout, unspecified: Secondary | ICD-10-CM | POA: Diagnosis not present

## 2014-09-13 DIAGNOSIS — Z125 Encounter for screening for malignant neoplasm of prostate: Secondary | ICD-10-CM | POA: Diagnosis not present

## 2014-09-13 DIAGNOSIS — I10 Essential (primary) hypertension: Secondary | ICD-10-CM | POA: Diagnosis not present

## 2014-09-14 DIAGNOSIS — Z471 Aftercare following joint replacement surgery: Secondary | ICD-10-CM | POA: Diagnosis not present

## 2014-09-14 DIAGNOSIS — Z96651 Presence of right artificial knee joint: Secondary | ICD-10-CM | POA: Diagnosis not present

## 2014-09-15 ENCOUNTER — Encounter: Payer: Self-pay | Admitting: Internal Medicine

## 2014-09-15 NOTE — Progress Notes (Signed)
Loop recorder 

## 2014-09-27 LAB — CUP PACEART REMOTE DEVICE CHECK: MDC IDC SESS DTM: 20160506113139

## 2014-09-28 ENCOUNTER — Telehealth: Payer: Self-pay | Admitting: Internal Medicine

## 2014-09-28 LAB — CUP PACEART REMOTE DEVICE CHECK: MDC IDC SESS DTM: 20160602094554

## 2014-09-28 NOTE — Telephone Encounter (Signed)
LMOVM informing pt that home monitor is working properly and transmission are being received.

## 2014-09-28 NOTE — Telephone Encounter (Signed)
New Message  Pt called states Dr. Lovena Le placed in a loop recorder. Wanted to make sure that it was still transmitting information. Please call

## 2014-10-10 ENCOUNTER — Ambulatory Visit (INDEPENDENT_AMBULATORY_CARE_PROVIDER_SITE_OTHER): Payer: Medicare Other | Admitting: *Deleted

## 2014-10-10 DIAGNOSIS — I639 Cerebral infarction, unspecified: Secondary | ICD-10-CM

## 2014-10-11 NOTE — Progress Notes (Signed)
Loop recorder 

## 2014-10-16 ENCOUNTER — Encounter: Payer: Self-pay | Admitting: Internal Medicine

## 2014-10-17 ENCOUNTER — Other Ambulatory Visit: Payer: Self-pay | Admitting: Gastroenterology

## 2014-10-18 LAB — CUP PACEART REMOTE DEVICE CHECK: Date Time Interrogation Session: 20160622155921

## 2014-10-20 DIAGNOSIS — E11329 Type 2 diabetes mellitus with mild nonproliferative diabetic retinopathy without macular edema: Secondary | ICD-10-CM | POA: Diagnosis not present

## 2014-10-20 DIAGNOSIS — H43393 Other vitreous opacities, bilateral: Secondary | ICD-10-CM | POA: Diagnosis not present

## 2014-10-20 DIAGNOSIS — E119 Type 2 diabetes mellitus without complications: Secondary | ICD-10-CM | POA: Diagnosis not present

## 2014-10-20 DIAGNOSIS — H25813 Combined forms of age-related cataract, bilateral: Secondary | ICD-10-CM | POA: Diagnosis not present

## 2014-10-23 ENCOUNTER — Encounter (HOSPITAL_COMMUNITY): Payer: Self-pay | Admitting: *Deleted

## 2014-10-23 ENCOUNTER — Other Ambulatory Visit: Payer: Self-pay

## 2014-10-23 NOTE — Progress Notes (Signed)
PATIENT RETURNED CALL FROM Therapist, sports. MEDICAL HISTORY DONE AND PATIENT RECEIVED PRE PROCEDURE INSTRUCTIONS.

## 2014-10-23 NOTE — Progress Notes (Addendum)
rn left pt message on 10-17-14, 10-19-14, 10-19-12 and 10-23-14, pt did not return call, pt instructed on voicemail to [facility] admit[facility] at 615 am on 10-24-14, bring cpap mask and tubing, npo after midnight, follow dr Tresa Garter bowel prep instructions.

## 2014-10-24 ENCOUNTER — Ambulatory Visit (HOSPITAL_COMMUNITY): Payer: Medicare Other | Admitting: Registered Nurse

## 2014-10-24 ENCOUNTER — Encounter (HOSPITAL_COMMUNITY)
Admission: RE | Disposition: A | Discharge status: Home / Self Care | Payer: Self-pay | Source: Ambulatory Visit | Attending: Gastroenterology

## 2014-10-24 ENCOUNTER — Ambulatory Visit (HOSPITAL_COMMUNITY)
Admission: RE | Admit: 2014-10-24 | Discharge: 2014-10-24 | Disposition: A | Discharge status: Home / Self Care | Payer: Medicare Other | Source: Ambulatory Visit | Attending: Gastroenterology | Admitting: Gastroenterology

## 2014-10-24 ENCOUNTER — Encounter (HOSPITAL_COMMUNITY): Payer: Self-pay

## 2014-10-24 DIAGNOSIS — Z9989 Dependence on other enabling machines and devices: Secondary | ICD-10-CM | POA: Insufficient documentation

## 2014-10-24 DIAGNOSIS — Z6834 Body mass index (BMI) 34.0-34.9, adult: Secondary | ICD-10-CM | POA: Insufficient documentation

## 2014-10-24 DIAGNOSIS — D122 Benign neoplasm of ascending colon: Secondary | ICD-10-CM | POA: Diagnosis not present

## 2014-10-24 DIAGNOSIS — D123 Benign neoplasm of transverse colon: Secondary | ICD-10-CM | POA: Insufficient documentation

## 2014-10-24 DIAGNOSIS — Z79899 Other long term (current) drug therapy: Secondary | ICD-10-CM | POA: Insufficient documentation

## 2014-10-24 DIAGNOSIS — Z8673 Personal history of transient ischemic attack (TIA), and cerebral infarction without residual deficits: Secondary | ICD-10-CM | POA: Diagnosis not present

## 2014-10-24 DIAGNOSIS — Z1211 Encounter for screening for malignant neoplasm of colon: Secondary | ICD-10-CM | POA: Diagnosis not present

## 2014-10-24 DIAGNOSIS — I10 Essential (primary) hypertension: Secondary | ICD-10-CM | POA: Insufficient documentation

## 2014-10-24 DIAGNOSIS — M109 Gout, unspecified: Secondary | ICD-10-CM | POA: Diagnosis not present

## 2014-10-24 DIAGNOSIS — Z96652 Presence of left artificial knee joint: Secondary | ICD-10-CM | POA: Insufficient documentation

## 2014-10-24 DIAGNOSIS — G4733 Obstructive sleep apnea (adult) (pediatric): Secondary | ICD-10-CM | POA: Diagnosis not present

## 2014-10-24 DIAGNOSIS — K635 Polyp of colon: Secondary | ICD-10-CM | POA: Diagnosis not present

## 2014-10-24 DIAGNOSIS — Z9884 Bariatric surgery status: Secondary | ICD-10-CM | POA: Insufficient documentation

## 2014-10-24 DIAGNOSIS — E119 Type 2 diabetes mellitus without complications: Secondary | ICD-10-CM | POA: Diagnosis not present

## 2014-10-24 DIAGNOSIS — H11441 Conjunctival cysts, right eye: Secondary | ICD-10-CM | POA: Diagnosis not present

## 2014-10-24 HISTORY — DX: Sleep apnea, unspecified: G47.30

## 2014-10-24 HISTORY — DX: Personal history of urinary calculi: Z87.442

## 2014-10-24 HISTORY — PX: COLONOSCOPY WITH PROPOFOL: SHX5780

## 2014-10-24 LAB — GLUCOSE, CAPILLARY: Glucose-Capillary: 143 mg/dL — ABNORMAL HIGH (ref 65–99)

## 2014-10-24 SURGERY — COLONOSCOPY WITH PROPOFOL
Anesthesia: Monitor Anesthesia Care

## 2014-10-24 MED ORDER — LIDOCAINE HCL (CARDIAC) 20 MG/ML IV SOLN
INTRAVENOUS | Status: DC | PRN
  Administered 2014-10-24: 100 mg via INTRAVENOUS

## 2014-10-24 MED ORDER — SODIUM CHLORIDE 0.9 % IV SOLN
INTRAVENOUS | Status: DC
Start: 2014-10-24 — End: 2014-10-24

## 2014-10-24 MED ORDER — PROPOFOL 10 MG/ML IV BOLUS
INTRAVENOUS | Status: AC
  Filled 2014-10-24: qty 20

## 2014-10-24 MED ORDER — SODIUM CHLORIDE 0.9 % IV SOLN: INTRAVENOUS | Status: DC

## 2014-10-24 MED ORDER — LIDOCAINE HCL (CARDIAC) 20 MG/ML IV SOLN
INTRAVENOUS | Status: AC
  Filled 2014-10-24: qty 5

## 2014-10-24 MED ORDER — LACTATED RINGERS IV SOLN
INTRAVENOUS | Status: DC
  Administered 2014-10-24: 125 mL/h via INTRAVENOUS

## 2014-10-24 MED ORDER — PROPOFOL INFUSION 10 MG/ML OPTIME
INTRAVENOUS | Status: DC | PRN
  Administered 2014-10-24: 160 ug/kg/min via INTRAVENOUS

## 2014-10-24 SURGICAL SUPPLY — 22 items

## 2014-10-24 NOTE — Op Note (Signed)
Procedure: Screening colonoscopy  Endoscopist: Earle Gell  Premedication: Propofol administered by anesthesia  Procedure: The patient was placed in the left lateral decubitus position. Anal inspection and digital rectal exam were normal. The Pentax pediatric colonoscope was introduced into the rectum and advanced to the cecum. A normal-appearing appendiceal orifice was identified. A normal-appearing ileocecal valve was intubated and the terminal ileum inspected. Colonic preparation for the exam today was good. Withdrawal time was 15 minutes  Rectum. Normal. Retroflexed view of the distal rectum was normal  Sigmoid colon and descending colon. Normal  Splenic flexure. Normal  Transverse colon. From the mid transverse colon, a 3 mm sessile polyp was removed with the cold biopsy forceps  Hepatic flexure. Normal  Ascending colon. From the proximal ascending colon, a 5 mm sessile polyp was removed with the cold snare and cold biopsy forceps.  Cecum and ileocecal valve. Normal  Terminal ileum. Normal  Assessment: A diminutive polyp was removed from the transverse colon and a small polyp was removed from the proximal descending colon. Otherwise normal colonoscopy.  Recommendation: If the polyps return adenomatous pathologically, schedule a repeat colonoscopy in 5 years.

## 2014-10-24 NOTE — Discharge Instructions (Signed)

## 2014-10-24 NOTE — H&P (Signed)
  Procedure: Screening colonoscopy. Obstructive sleep apnea syndrome. BMI 37  History: The patient is a 69 year old male born 03/02/1946. He is scheduled to undergo a screening colonoscopy today.  Past medical history: Lap band surgery performed in 2012. Right elbow surgery. Left total knee replacement surgery performed in 2007. Bilateral carpal tunnel surgery. Cystoscopy. Right knee arthroscopy. Gout. Hypercholesterolemia. Obstructive sleep apnea syndrome. Hypertension. Type 2 diabetes mellitus. Aortic sclerosis murmur with mild aortic valve insufficiency. Kidney stones.  Medication allergies: None  Exam: The patient is alert and lying comfortably on the endoscopy stretcher. Abdomen is soft and nontender to palpation. Lungs are clear to auscultation. Cardiac exam reveals a regular rhythm.  Plan: Proceed with screening colonoscopy

## 2014-10-24 NOTE — Anesthesia Postprocedure Evaluation (Signed)
  Anesthesia Post-op Note  Patient: Eddie Zimmerman  Procedure(s) Performed: Procedure(s) (LRB): COLONOSCOPY WITH PROPOFOL (N/A)  Patient Location: PACU  Anesthesia Type: MAC  Level of Consciousness: awake and alert   Airway and Oxygen Therapy: Patient Spontanous Breathing  Post-op Pain: mild  Post-op Assessment: Post-op Vital signs reviewed, Patient's Cardiovascular Status Stable, Respiratory Function Stable, Patent Airway and No signs of Nausea or vomiting  Last Vitals:  Filed Vitals:   10/24/14 0915  BP: 142/83  Pulse: 50  Temp:   Resp: 18    Post-op Vital Signs: stable   Complications: No apparent anesthesia complications

## 2014-10-24 NOTE — Anesthesia Preprocedure Evaluation (Addendum)
Anesthesia Evaluation  Patient identified by MRN, date of birth, ID band Patient awake    Reviewed: Allergy & Precautions, NPO status , Patient's Chart, lab work & pertinent test results  Airway Mallampati: I  TM Distance: >3 FB Neck ROM: Full    Dental no notable dental hx. (+) Teeth Intact, Dental Advisory Given   Pulmonary sleep apnea and Continuous Positive Airway Pressure Ventilation ,  breath sounds clear to auscultation        Cardiovascular hypertension, Pt. on medications Normal cardiovascular examRhythm:Regular Rate:Normal  HOCM. EF 60%. Moderate LVH.   Neuro/Psych Depression S/p TKA TIA TIAnegative neurological ROS     GI/Hepatic negative GI ROS, Neg liver ROS,   Endo/Other  diabetes, Well Controlled, Type 2, Oral Hypoglycemic AgentsMorbid obesity  Renal/GU negative Renal ROS     Musculoskeletal   Abdominal   Peds  Hematology negative hematology ROS (+)   Anesthesia Other Findings   Reproductive/Obstetrics                            Anesthesia Physical Anesthesia Plan  ASA: III  Anesthesia Plan: MAC   Post-op Pain Management:    Induction:   Airway Management Planned: Simple Face Mask  Additional Equipment:   Intra-op Plan:   Post-operative Plan:   Informed Consent:   Plan Discussed with: Surgeon  Anesthesia Plan Comments:        Anesthesia Quick Evaluation

## 2014-10-24 NOTE — Anesthesia Procedure Notes (Signed)
Procedure Name: MAC Date/Time: 10/24/2014 8:27 AM Performed by: Carleene Cooper A Pre-anesthesia Checklist: Timeout performed, Patient identified, Emergency Drugs available, Suction available and Patient being monitored Patient Re-evaluated:Patient Re-evaluated prior to inductionOxygen Delivery Method: Simple face mask Dental Injury: Teeth and Oropharynx as per pre-operative assessment

## 2014-10-24 NOTE — Transfer of Care (Signed)
Immediate Anesthesia Transfer of Care Note  Patient: Eddie Zimmerman  Procedure(s) Performed: Procedure(s): COLONOSCOPY WITH PROPOFOL (N/A)  Patient Location: PACU and Endoscopy Unit  Anesthesia Type:MAC  Level of Consciousness: awake, alert , oriented and patient cooperative  Airway & Oxygen Therapy: Patient Spontanous Breathing and Patient connected to face mask oxygen  Post-op Assessment: Report given to RN, Post -op Vital signs reviewed and stable and Patient moving all extremities  Post vital signs: Reviewed and stable  Last Vitals:  Filed Vitals:   10/24/14 0708  BP: 143/78  Pulse: 57  Temp: 36.7 C  Resp: 12    Complications: No apparent anesthesia complications

## 2014-10-25 ENCOUNTER — Encounter (HOSPITAL_COMMUNITY): Payer: Self-pay | Admitting: Gastroenterology

## 2014-11-07 ENCOUNTER — Encounter: Payer: Self-pay | Admitting: Internal Medicine

## 2014-11-09 ENCOUNTER — Ambulatory Visit (INDEPENDENT_AMBULATORY_CARE_PROVIDER_SITE_OTHER): Payer: Medicare Other | Admitting: *Deleted

## 2014-11-09 DIAGNOSIS — I639 Cerebral infarction, unspecified: Secondary | ICD-10-CM

## 2014-11-09 NOTE — Progress Notes (Signed)
Loop recorder 

## 2014-11-17 LAB — CUP PACEART REMOTE DEVICE CHECK: MDC IDC SESS DTM: 20160722141238

## 2014-12-07 ENCOUNTER — Encounter: Payer: Self-pay | Admitting: Internal Medicine

## 2014-12-08 ENCOUNTER — Ambulatory Visit (INDEPENDENT_AMBULATORY_CARE_PROVIDER_SITE_OTHER): Payer: Medicare Other

## 2014-12-08 ENCOUNTER — Other Ambulatory Visit: Payer: Self-pay | Admitting: Internal Medicine

## 2014-12-08 ENCOUNTER — Telehealth: Payer: Self-pay | Admitting: *Deleted

## 2014-12-08 DIAGNOSIS — I639 Cerebral infarction, unspecified: Secondary | ICD-10-CM | POA: Diagnosis not present

## 2014-12-08 LAB — CUP PACEART REMOTE DEVICE CHECK: MDC IDC SESS DTM: 20160812151735

## 2014-12-08 NOTE — Telephone Encounter (Signed)
LMOM for patient to call back regarding new A-fib seen on loop recorder.  Will forward to scheduler to schedule appointment as soon as possible in the AF Clinic to discuss anti-coagulation.

## 2014-12-12 ENCOUNTER — Encounter (HOSPITAL_COMMUNITY): Payer: Self-pay | Admitting: Nurse Practitioner

## 2014-12-12 ENCOUNTER — Ambulatory Visit (HOSPITAL_COMMUNITY)
Admission: RE | Admit: 2014-12-12 | Discharge: 2014-12-12 | Disposition: A | Discharge status: Home / Self Care | Payer: Medicare Other | Source: Ambulatory Visit | Attending: Nurse Practitioner | Admitting: Nurse Practitioner

## 2014-12-12 ENCOUNTER — Other Ambulatory Visit (HOSPITAL_COMMUNITY): Payer: Self-pay | Admitting: *Deleted

## 2014-12-12 VITALS — BP 122/82 | HR 61 | Ht 70.0 in | Wt 256.8 lb

## 2014-12-12 DIAGNOSIS — G4730 Sleep apnea, unspecified: Secondary | ICD-10-CM | POA: Diagnosis not present

## 2014-12-12 DIAGNOSIS — Z8673 Personal history of transient ischemic attack (TIA), and cerebral infarction without residual deficits: Secondary | ICD-10-CM | POA: Insufficient documentation

## 2014-12-12 DIAGNOSIS — Z9884 Bariatric surgery status: Secondary | ICD-10-CM | POA: Insufficient documentation

## 2014-12-12 DIAGNOSIS — I480 Paroxysmal atrial fibrillation: Secondary | ICD-10-CM | POA: Diagnosis not present

## 2014-12-12 DIAGNOSIS — I4891 Unspecified atrial fibrillation: Secondary | ICD-10-CM | POA: Diagnosis present

## 2014-12-12 DIAGNOSIS — I10 Essential (primary) hypertension: Secondary | ICD-10-CM | POA: Diagnosis not present

## 2014-12-12 DIAGNOSIS — Z79899 Other long term (current) drug therapy: Secondary | ICD-10-CM | POA: Insufficient documentation

## 2014-12-12 DIAGNOSIS — E119 Type 2 diabetes mellitus without complications: Secondary | ICD-10-CM | POA: Diagnosis not present

## 2014-12-12 LAB — CBC
HEMATOCRIT: 45.6 % (ref 39.0–52.0)
HEMOGLOBIN: 14.9 g/dL (ref 13.0–17.0)
MCH: 30.5 pg (ref 26.0–34.0)
MCHC: 32.7 g/dL (ref 30.0–36.0)
MCV: 93.4 fL (ref 78.0–100.0)
Platelets: 201 10*3/uL (ref 150–400)
RBC: 4.88 MIL/uL (ref 4.22–5.81)
RDW: 14.5 % (ref 11.5–15.5)
WBC: 6.4 10*3/uL (ref 4.0–10.5)

## 2014-12-12 LAB — BASIC METABOLIC PANEL
ANION GAP: 10 (ref 5–15)
BUN: 10 mg/dL (ref 6–20)
CHLORIDE: 105 mmol/L (ref 101–111)
CO2: 27 mmol/L (ref 22–32)
Calcium: 9.8 mg/dL (ref 8.9–10.3)
Creatinine, Ser: 0.85 mg/dL (ref 0.61–1.24)
GFR calc Af Amer: >60 mL/min (ref >60)
GLUCOSE: 127 mg/dL — AB (ref 65–99)
POTASSIUM: 4.3 mmol/L (ref 3.5–5.1)
SODIUM: 142 mmol/L (ref 135–145)

## 2014-12-12 MED ORDER — RIVAROXABAN 20 MG PO TABS: 20.0000 mg | ORAL_TABLET | Freq: Every day | ORAL | Status: DC

## 2014-12-12 NOTE — Addendum Note (Signed)
Encounter addended by: Sherran Needs, NP on: 12/12/2014 11:50 AM<BR>     Documentation filed: Follow-up Section, LOS Section

## 2014-12-12 NOTE — Progress Notes (Addendum)
Patient ID: Eddie Zimmerman, male   DOB: 01/30/1946, 69 y.o.   MRN: [id number]     Primary Care Physician: Kandice Hams, MD Referring Physician: [place]   Eddie Zimmerman is a 69 y.o. male with  cryptogenic stroke with ILR placed 3/16 after stroke. Brief episode of  asymptomatic Afib was noted 8/12 and pt in in the afib clinic today to discuss anticoagulation. He denies bleeding tendencies. Steady on his feet, no fall history. CBC/bmet last drawn in February and will be repeated today for beginning of blood thinners. Is taking 325 mg asa post stroke, which followed knee surgery 2/16. No residual effects from stroke. He did just have two polyps removed in June with an colonoscopy.  Today, he denies symptoms of palpitations, chest pain, shortness of breath, orthopnea, PND, lower extremity edema, dizziness, presyncope, syncope, or neurologic sequela. The patient is tolerating medications without difficulties and is otherwise without complaint today.   Past Medical History  Diagnosis Date  . Diabetes mellitus without complication   . Hypertension   . Gout   . Depression   . Arthritis     hands, wrist & Knee   . Stroke 06-07-2014  . Sleep apnea     cpap use, SETTING OF 13  . History of kidney stones LAST MAY 2015   Past Surgical History  Procedure Laterality Date  . Laparoscopic gastric banding    . Knee surgery Left   . Hernia repair      abdominal  . Joint replacement Left   . Tonsillectomy    . Arthroscopic repair pcl Bilateral     right- x1, left- x3   . Total knee arthroplasty Right 06/07/2014    Procedure: TOTAL KNEE ARTHROPLASTY;  Surgeon: Kerin Salen, MD;  Location: [place];  Service: Orthopedics;  Laterality: Right;  . Tarsal tunnel release Bilateral 2005  . Tee without cardioversion N/A 06/14/2014    Procedure: TRANSESOPHAGEAL ECHOCARDIOGRAM (TEE);  Surgeon: Sueanne Margarita, MD;  Location: [place];  Service: Cardiovascular;  Laterality: N/A;  .  Loop recorder implant N/A 07/12/2014    Procedure: LOOP RECORDER IMPLANT;  Surgeon: Evans Lance, MD;  Location[facility] C[facility] LAB;  Service: Cardiovascular;  Laterality: N/A;  . Colonoscopy with propofol N/A 10/24/2014    Procedure: COLONOSCOPY WITH PROPOFOL;  Surgeon: Garlan Fair, MD;  Location: [facility] ENDOSCOPY;  Service: Endoscopy;  Laterality: N/A;    Current Outpatient Prescriptions  Medication Sig Dispense Refill  . allopurinol (ZYLOPRIM) 300 MG tablet Take 300 mg by mouth daily at 12 noon.     . B Complex Vitamins (B COMPLEX PO) Take 1 tablet by mouth daily at 12 noon.     . Coenzyme Q10 (CO Q10) 100 MG CAPS Take 200 mg by mouth daily. (Patient taking differently: Take 200 mg by mouth daily at 12 noon. ) 30 each   . escitalopram (LEXAPRO) 10 MG tablet Take 10 mg by mouth daily at 12 noon.     . fish oil-omega-3 fatty acids 1000 MG capsule Take 2 g by mouth daily at 12 noon.     [name] lisinopril (PRINIVIL,ZESTRIL) 20 MG tablet take 1 tablet by mouth once daily (Patient taking differently: take 1 tablet by mouth once daily at noon) 60 tablet 5  . Multiple Vitamin (MULTIVITAMIN PO) Take 1 tablet by mouth daily at 12 noon.     . pioglitazone (ACTOS) 15 MG tablet Take 15 mg by mouth daily at 12 noon.     [name]  rosuvastatin (CRESTOR) 5 MG tablet Take 5 mg by mouth daily at 12 noon.    [name] LYRICA 75 MG capsule Take 1 capsule by mouth daily.    [name] oxyCODONE-acetaminophen (ROXICET) 5-325 MG per tablet Take 1 tablet by mouth every 4 (four) hours as needed. (Patient not taking: Reported on 12/12/2014) 60 tablet 0  . rivaroxaban (XARELTO) 20 MG TABS tablet Take 1 tablet (20 mg total) by mouth daily with supper. 30 tablet 2   No current facility-administered medications for this encounter.    No Known Allergies  Social History   Social History  . Marital Status: Married    Spouse Name: [name]  . Number of Children: 3  . Years of Education: Master's   Occupational History  . [employer]    Social History  Main Topics  . Smoking status: Never Smoker   . Smokeless tobacco: Never Used  . Alcohol Use: 0.0 oz/week    0 Standard drinks or equivalent per week     Comment: rare 1 PER month  . Drug Use: No  . Sexual Activity: Not on file   Other Topics Concern  . Not on file   Social History Narrative   Lives at home with wife.   Left handed.    Caffeine use: Drinks 1 glass coffee, tea, soda per day. Varies.    Family History  Problem Relation Age of Onset  . AAA (abdominal aortic aneurysm) Mother   . Alzheimer's disease Father   . Cirrhosis Brother     of the liver  . AAA (abdominal aortic aneurysm) Brother     ROS- All systems are reviewed and negative except as per the HPI above  Physical Exam: Filed Vitals:   12/12/14 1052  BP: 122/82  Pulse: 61  Height: 5\' 10"  (1.778 m)  Weight: 256 lb 12.8 oz (116.484 kg)    GEN- The patient is well appearing, alert and oriented x 3 today.   Head- normocephalic, atraumatic Eyes-  Sclera clear, conjunctiva pink Ears- hearing intact Oropharynx- clear Neck- supple, no JVP Lymph- no cervical lymphadenopathy Lungs- Clear to ausculation bilaterally, normal work of breathing Heart- Regular rate and rhythm, no murmurs, rubs or gallops, PMI not laterally displaced GI- soft, NT, ND, + BS Extremities- no clubbing, cyanosis, or edema MS- no significant deformity or atrophy Skin- no rash or lesion Psych- euthymic mood, full affect Neuro- strength and sensation are intact  EKG-NSR, LAD NS T wave abnormality 61 bpm, PR int 202 ms, Qrs 90 ms, QTc 406 ms Epic records reviewed  Assessment and Plan: 1. Cryptogenic Stroke with new afib by ILR Therapeutic options re blood thinners were discussed with pt, including warfarin, pradaxa, xareto, eliquis, discussed in detail with benefit vrs possible adverse effects. Chadsvasc score of at least 5. Pt would like to try xarelto 20 mg qd. Stop asa Bmet, cbc today as baseline staring anticoagulants    Bleeding precautions given  2. PAF Not symptomatic and very low burden  Does not require any treatment at this time. Conitue remote monioting  3. HTN Stable  F/u in 3 weeks for tolerance  Butch Penny C. Paraskevi Funez, [facility] [address] [place], [place] [address] [phone]

## 2014-12-12 NOTE — Patient Instructions (Addendum)
Your physician has recommended you make the following change in your medication:  1)Stop aspirin 2)Xarelto 20mg  once a day with supper   Parking code [phone]

## 2014-12-12 NOTE — Progress Notes (Signed)
Loop recorder 

## 2014-12-25 LAB — CUP PACEART REMOTE DEVICE CHECK: MDC IDC SESS DTM: 20160829100114

## 2015-01-02 DIAGNOSIS — H0259 Other disorders affecting eyelid function: Secondary | ICD-10-CM | POA: Diagnosis not present

## 2015-01-03 ENCOUNTER — Encounter: Payer: Self-pay | Admitting: Internal Medicine

## 2015-01-03 ENCOUNTER — Encounter (HOSPITAL_COMMUNITY): Payer: Self-pay | Admitting: Nurse Practitioner

## 2015-01-03 ENCOUNTER — Ambulatory Visit (HOSPITAL_COMMUNITY)
Admission: RE | Admit: 2015-01-03 | Discharge: 2015-01-03 | Disposition: A | Discharge status: Home / Self Care | Payer: Medicare Other | Source: Ambulatory Visit | Attending: Nurse Practitioner | Admitting: Nurse Practitioner

## 2015-01-03 VITALS — BP 138/84 | HR 60 | Ht 70.0 in | Wt 259.6 lb

## 2015-01-03 DIAGNOSIS — I639 Cerebral infarction, unspecified: Secondary | ICD-10-CM | POA: Insufficient documentation

## 2015-01-03 DIAGNOSIS — I480 Paroxysmal atrial fibrillation: Secondary | ICD-10-CM | POA: Insufficient documentation

## 2015-01-03 MED ORDER — RIVAROXABAN 20 MG PO TABS: 20.0000 mg | ORAL_TABLET | Freq: Every day | ORAL | Status: DC

## 2015-01-03 NOTE — Progress Notes (Signed)
Patient ID: Eddie Zimmerman, male   DOB: [birth year], 69 y.o.   MRN: [id number]     Primary Care Physician: Eddie Hams, MD Referring Physician: [place]   Eddie Zimmerman is a 69 y.o. male with  cryptogenic stroke with ILR placed 3/16 . Brief episode of  asymptomatic Afib was noted 8/12 and pt was referred the afib clinic  to discuss anticoagulation.  He was started on xarelto. He denies bleeding tendencies. Steady on his feet, no fall history. No unusual  bleeding with xarelto. Stroke  followed knee surgery 2/16. No residual effects from stroke. No awareness of afib. Pt does state today that he had frequent kidney stones and sees urologist this week. Discussed could see more bleeding if passes a kidney stone and to make urologist aware that he is on blood thinner.  Today, he denies symptoms of palpitations, chest pain, shortness of breath, orthopnea, PND, lower extremity edema, dizziness, presyncope, syncope, or neurologic sequela. The patient is tolerating medications without difficulties and is otherwise without complaint today.   Past Medical History  Diagnosis Date  . Diabetes mellitus without complication   . Hypertension   . Gout   . Depression   . Arthritis     hands, wrist & Knee   . Stroke 06-07-2014  . Sleep apnea     cpap use, SETTING OF 13  . History of kidney stones LAST MAY 2015   Past Surgical History  Procedure Laterality Date  . Laparoscopic gastric banding    . Knee surgery Left   . Hernia repair      abdominal  . Joint replacement Left   . Tonsillectomy    . Arthroscopic repair pcl Bilateral     right- x1, left- x3   . Total knee arthroplasty Right 06/07/2014    Procedure: TOTAL KNEE ARTHROPLASTY;  Surgeon: Eddie Salen, MD;  Location: [facility];  Service: Orthopedics;  Laterality: Right;  . Tarsal tunnel release Bilateral 2005  . Tee without cardioversion N/A 06/14/2014    Procedure: TRANSESOPHAGEAL ECHOCARDIOGRAM (TEE);  Surgeon: Eddie Margarita, MD;  Location: [facility];  Service: Cardiovascular;  Laterality: N/A;  . Loop recorder implant N/A 07/12/2014    Procedure: LOOP RECORDER IMPLANT;  Surgeon: Eddie Lance, MD;  Location: [facility] LAB;  Service: Cardiovascular;  Laterality: N/A;  . Colonoscopy with propofol N/A 10/24/2014    Procedure: COLONOSCOPY WITH PROPOFOL;  Surgeon: Eddie Fair, MD;  Location: [facility];  Service: Endoscopy;  Laterality: N/A;    Current Outpatient Prescriptions  Medication Sig Dispense Refill  . allopurinol (ZYLOPRIM) 300 MG tablet Take 300 mg by mouth daily at 12 noon.     . B Complex Vitamins (B COMPLEX PO) Take 1 tablet by mouth daily at 12 noon.     . Coenzyme Q10 (CO Q10) 100 MG CAPS Take 200 mg by mouth daily. (Patient taking differently: Take 200 mg by mouth daily at 12 noon. ) 30 each   . escitalopram (LEXAPRO) 10 MG tablet Take 10 mg by mouth daily at 12 noon.     . fish oil-omega-3 fatty acids 1000 MG capsule Take 2 g by mouth daily at 12 noon.     [name] lisinopril (PRINIVIL,ZESTRIL) 20 MG tablet take 1 tablet by mouth once daily (Patient taking differently: take 1 tablet by mouth once daily at noon) 60 tablet 5  . LYRICA 75 MG capsule Take 1 capsule by mouth daily.    [name]  Multiple Vitamin (MULTIVITAMIN PO) Take 1 tablet by mouth daily at 12 noon.     . pioglitazone (ACTOS) 15 MG tablet Take 15 mg by mouth daily at 12 noon.     . rivaroxaban (XARELTO) 20 MG TABS tablet Take 1 tablet (20 mg total) by mouth daily with supper. 90 tablet 3  . rosuvastatin (CRESTOR) 5 MG tablet Take 5 mg by mouth daily at 12 noon.    [name] oxyCODONE-acetaminophen (ROXICET) 5-325 MG per tablet Take 1 tablet by mouth every 4 (four) hours as needed. (Patient not taking: Reported on 01/03/2015) 60 tablet 0   No current facility-administered medications for this encounter.    No Known Allergies  Social History   Social History  . Marital Status: Married    Spouse Name: Eddie Zimmerman  . Number of Children: 3  .  Years of Education: Master's   Occupational History  . [employer]    Social History Main Topics  . Smoking status: Never Smoker   . Smokeless tobacco: Never Used  . Alcohol Use: 0.0 oz/week    0 Standard drinks or equivalent per week     Comment: rare 1 PER month  . Drug Use: No  . Sexual Activity: Not on file   Other Topics Concern  . Not on file   Social History Narrative   Lives at home with wife.   Left handed.    Caffeine use: Drinks 1 glass coffee, tea, soda per day. Varies.    Family History  Problem Relation Age of Onset  . AAA (abdominal aortic aneurysm) Mother   . Alzheimer's disease Father   . Cirrhosis Brother     of the liver  . AAA (abdominal aortic aneurysm) Brother     ROS- All systems are reviewed and negative except as per the HPI above  Physical Exam: Filed Vitals:   01/03/15 1011  BP: 138/84  Pulse: 60  Height: 5\' 10"  (1.778 m)  Weight: 259 lb 9.6 oz (117.754 kg)    GEN- The patient is well appearing, alert and oriented x 3 today.   Head- normocephalic, atraumatic Eyes-  Sclera clear, conjunctiva pink Ears- hearing intact Oropharynx- clear Neck- supple, no JVP Lymph- no cervical lymphadenopathy Lungs- Clear to ausculation bilaterally, normal work of breathing Heart- Regular rate and rhythm, no murmurs, rubs or gallops, PMI not laterally displaced GI- soft, NT, ND, + BS Extremities- no clubbing, cyanosis, or edema MS- no significant deformity or atrophy Skin- no rash or lesion Psych- euthymic mood, full affect Neuro- strength and sensation are intact  EKG-NSR, LAD NS T wave abnormality 60 bpm, PR int 186 ms, Qrs 98 ms, QTc 416 ms Epic records reviewed  Assessment and Plan: 1. Cryptogenic Stroke with new afib by ILR Chadsvasc score of at least 5. Continue xarelto 20 mg qd. Off asa  Bleeding precautions given  2. PAF Not symptomatic and very low burden  Does not require any treatment at this time. Continue remote monioting  3.  HTN Stable  F/u in 3 months   Eddie Zimmerman. Eddie Zimmerman, [facility] [address] [place], [place] [address] [phone]

## 2015-01-05 ENCOUNTER — Other Ambulatory Visit: Payer: Self-pay | Admitting: Interventional Cardiology

## 2015-01-08 ENCOUNTER — Ambulatory Visit (INDEPENDENT_AMBULATORY_CARE_PROVIDER_SITE_OTHER): Payer: Medicare Other | Admitting: *Deleted

## 2015-01-08 DIAGNOSIS — I639 Cerebral infarction, unspecified: Secondary | ICD-10-CM

## 2015-01-09 NOTE — Progress Notes (Signed)
Loop recorder 

## 2015-01-10 ENCOUNTER — Encounter: Payer: Self-pay | Admitting: Internal Medicine

## 2015-01-10 DIAGNOSIS — N200 Calculus of kidney: Secondary | ICD-10-CM | POA: Diagnosis not present

## 2015-01-12 LAB — CUP PACEART REMOTE DEVICE CHECK: Date Time Interrogation Session: 20160916074942

## 2015-01-12 NOTE — Progress Notes (Signed)
Carelink summary report received. Battery status OK. Normal device function. No new symptom episodes, tachy episodes, brady, or pause episodes. No new AF episodes, +Xarelto. Monthly summary reports and ROV with GT in 06/2015.

## 2015-01-19 ENCOUNTER — Encounter: Payer: Self-pay | Admitting: Interventional Cardiology

## 2015-01-23 DIAGNOSIS — Z1283 Encounter for screening for malignant neoplasm of skin: Secondary | ICD-10-CM | POA: Diagnosis not present

## 2015-01-31 ENCOUNTER — Encounter: Payer: Self-pay | Admitting: Internal Medicine

## 2015-02-07 ENCOUNTER — Ambulatory Visit (INDEPENDENT_AMBULATORY_CARE_PROVIDER_SITE_OTHER): Payer: Medicare Other | Admitting: *Deleted

## 2015-02-07 DIAGNOSIS — I639 Cerebral infarction, unspecified: Secondary | ICD-10-CM

## 2015-02-07 NOTE — Progress Notes (Signed)
Loop recorder 

## 2015-02-12 LAB — CUP PACEART REMOTE DEVICE CHECK: Date Time Interrogation Session: 20161012140614

## 2015-02-12 NOTE — Progress Notes (Signed)
Carelink summary report received. Battery status OK. Normal device function. No new symptom episodes, tachy episodes, brady, or pause episodes. 1 AF episode (burden 0.0%), +Xarelto, avg V rates controlled. Monthly summary reports and ROV with GT in 06/2015.

## 2015-03-05 DIAGNOSIS — H0520 Unspecified exophthalmos: Secondary | ICD-10-CM | POA: Diagnosis not present

## 2015-03-05 DIAGNOSIS — H1189 Other specified disorders of conjunctiva: Secondary | ICD-10-CM | POA: Diagnosis not present

## 2015-03-05 DIAGNOSIS — Z7901 Long term (current) use of anticoagulants: Secondary | ICD-10-CM | POA: Diagnosis not present

## 2015-03-05 DIAGNOSIS — I4891 Unspecified atrial fibrillation: Secondary | ICD-10-CM | POA: Diagnosis not present

## 2015-03-05 DIAGNOSIS — E119 Type 2 diabetes mellitus without complications: Secondary | ICD-10-CM | POA: Diagnosis not present

## 2015-03-05 DIAGNOSIS — I10 Essential (primary) hypertension: Secondary | ICD-10-CM | POA: Diagnosis not present

## 2015-03-05 DIAGNOSIS — G459 Transient cerebral ischemic attack, unspecified: Secondary | ICD-10-CM | POA: Diagnosis not present

## 2015-03-05 DIAGNOSIS — H269 Unspecified cataract: Secondary | ICD-10-CM | POA: Diagnosis not present

## 2015-03-05 DIAGNOSIS — Z79899 Other long term (current) drug therapy: Secondary | ICD-10-CM | POA: Diagnosis not present

## 2015-03-05 DIAGNOSIS — H2513 Age-related nuclear cataract, bilateral: Secondary | ICD-10-CM | POA: Diagnosis not present

## 2015-03-09 ENCOUNTER — Ambulatory Visit (INDEPENDENT_AMBULATORY_CARE_PROVIDER_SITE_OTHER): Payer: Medicare Other | Admitting: *Deleted

## 2015-03-09 DIAGNOSIS — I639 Cerebral infarction, unspecified: Secondary | ICD-10-CM | POA: Diagnosis not present

## 2015-03-09 NOTE — Progress Notes (Signed)
Carelink Summary Report / Loop recorder 

## 2015-04-03 ENCOUNTER — Other Ambulatory Visit: Payer: Self-pay | Admitting: Interventional Cardiology

## 2015-04-05 ENCOUNTER — Ambulatory Visit (HOSPITAL_COMMUNITY)
Admission: RE | Admit: 2015-04-05 | Discharge: 2015-04-05 | Disposition: A | Discharge status: Home / Self Care | Payer: Medicare Other | Source: Ambulatory Visit | Attending: Nurse Practitioner | Admitting: Nurse Practitioner

## 2015-04-05 ENCOUNTER — Encounter (HOSPITAL_COMMUNITY): Payer: Self-pay | Admitting: Nurse Practitioner

## 2015-04-05 VITALS — BP 140/96 | HR 57 | Ht 70.0 in | Wt 288.0 lb

## 2015-04-05 DIAGNOSIS — Z8673 Personal history of transient ischemic attack (TIA), and cerebral infarction without residual deficits: Secondary | ICD-10-CM | POA: Diagnosis not present

## 2015-04-05 DIAGNOSIS — M109 Gout, unspecified: Secondary | ICD-10-CM | POA: Diagnosis not present

## 2015-04-05 DIAGNOSIS — Z95818 Presence of other cardiac implants and grafts: Secondary | ICD-10-CM | POA: Diagnosis not present

## 2015-04-05 DIAGNOSIS — Z79899 Other long term (current) drug therapy: Secondary | ICD-10-CM | POA: Insufficient documentation

## 2015-04-05 DIAGNOSIS — I10 Essential (primary) hypertension: Secondary | ICD-10-CM | POA: Insufficient documentation

## 2015-04-05 DIAGNOSIS — Z7984 Long term (current) use of oral hypoglycemic drugs: Secondary | ICD-10-CM | POA: Diagnosis not present

## 2015-04-05 DIAGNOSIS — I480 Paroxysmal atrial fibrillation: Secondary | ICD-10-CM

## 2015-04-05 DIAGNOSIS — F329 Major depressive disorder, single episode, unspecified: Secondary | ICD-10-CM | POA: Diagnosis not present

## 2015-04-05 DIAGNOSIS — G4730 Sleep apnea, unspecified: Secondary | ICD-10-CM | POA: Insufficient documentation

## 2015-04-05 DIAGNOSIS — E119 Type 2 diabetes mellitus without complications: Secondary | ICD-10-CM | POA: Diagnosis not present

## 2015-04-05 DIAGNOSIS — Z7902 Long term (current) use of antithrombotics/antiplatelets: Secondary | ICD-10-CM | POA: Insufficient documentation

## 2015-04-05 NOTE — Progress Notes (Signed)
Patient ID: Eddie Zimmerman, male   DOB: 05-09-1945, 69 y.o.   MRN: [id number]     Primary Care Physician: Kandice Hams, MD Referring Physician: [place]   Eddie Zimmerman is a 69 y.o. male with  cryptogenic stroke with ILR placed 3/16 . Brief episode of  asymptomatic Afib was noted 8/12 and pt was referred the afib clinic  to discuss anticoagulation.  He was started on xarelto. He denies bleeding tendencies. Steady on his feet, no fall history. No unusual  bleeding with xarelto. Stroke  followed knee surgery 2/16. No residual effects from stroke. No awareness of afib.  Returns to afib clinic 12/8, doing well with blood thinners. He did bite his tongue a couple of weeks ago and it bled x 2 hours. He used ice and a tea beg to control bleeding. Remote device check showed 0 % afib burden.  Today, he denies symptoms of palpitations, chest pain, shortness of breath, orthopnea, PND, lower extremity edema, dizziness, presyncope, syncope, or neurologic sequela. The patient is tolerating medications without difficulties and is otherwise without complaint today.   Past Medical History  Diagnosis Date  . Diabetes mellitus without complication ([place])   . Hypertension   . Gout   . Depression   . Arthritis     hands, wrist & Knee   . Stroke ([place]) 06-07-2014  . Sleep apnea     cpap use, SETTING OF 13  . History of kidney stones LAST MAY 2015   Past Surgical History  Procedure Laterality Date  . Laparoscopic gastric banding    . Knee surgery Left   . Hernia repair      abdominal  . Joint replacement Left   . Tonsillectomy    . Arthroscopic repair pcl Bilateral     right- x1, left- x3   . Total knee arthroplasty Right 06/07/2014    Procedure: TOTAL KNEE ARTHROPLASTY;  Surgeon: Kerin Salen, MD;  Location: [facility];  Service: Orthopedics;  Laterality: Right;  . Tarsal tunnel release Bilateral 2005  . Tee without cardioversion N/A 06/14/2014    Procedure: TRANSESOPHAGEAL  ECHOCARDIOGRAM (TEE);  Surgeon: Sueanne Margarita, MD;  Location: [facility];  Service: Cardiovascular;  Laterality: N/A;  . Loop recorder implant N/A 07/12/2014    Procedure: LOOP RECORDER IMPLANT;  Surgeon: Evans Lance, MD;  Location: [facility] CATH LAB;  Service: Cardiovascular;  Laterality: N/A;  . Colonoscopy with propofol N/A 10/24/2014    Procedure: COLONOSCOPY WITH PROPOFOL;  Surgeon: Garlan Fair, MD;  Location: [facility];  Service: Endoscopy;  Laterality: N/A;    Current Outpatient Prescriptions  Medication Sig Dispense Refill  . allopurinol (ZYLOPRIM) 300 MG tablet Take 300 mg by mouth daily at 12 noon.     . B Complex Vitamins (B COMPLEX PO) Take 1 tablet by mouth daily at 12 noon.     . Coenzyme Q10 (CO Q10) 100 MG CAPS Take 200 mg by mouth daily. (Patient taking differently: Take 200 mg by mouth daily at 12 noon. ) 30 each   . CRESTOR 5 MG tablet TAKE 1 TABLET DAILY 45 tablet 0  . escitalopram (LEXAPRO) 10 MG tablet Take 10 mg by mouth daily at 12 noon.     . fish oil-omega-3 fatty acids 1000 MG capsule Take 2 g by mouth daily at 12 noon.     [name] lisinopril (PRINIVIL,ZESTRIL) 20 MG tablet take 1 tablet by mouth once daily (Patient taking differently: take 1 tablet by  mouth once daily at noon) 60 tablet 5  . LYRICA 75 MG capsule Take 1 capsule by mouth daily.    . Multiple Vitamin (MULTIVITAMIN PO) Take 1 tablet by mouth daily at 12 noon.     . pioglitazone (ACTOS) 15 MG tablet Take 15 mg by mouth daily at 12 noon.     . rivaroxaban (XARELTO) 20 MG TABS tablet Take 1 tablet (20 mg total) by mouth daily with supper. 90 tablet 3   No current facility-administered medications for this encounter.    No Known Allergies  Social History   Social History  . Marital Status: Married    Spouse Name: [name]  . Number of Children: 3  . Years of Education: Master's   Occupational History  . [employer]    Social History Main Topics  . Smoking status: Never Smoker   . Smokeless tobacco:  Never Used  . Alcohol Use: 0.0 oz/week    0 Standard drinks or equivalent per week     Comment: rare 1 PER month  . Drug Use: No  . Sexual Activity: Not on file   Other Topics Concern  . Not on file   Social History Narrative   Lives at home with wife.   Left handed.    Caffeine use: Drinks 1 glass coffee, tea, soda per day. Varies.    Family History  Problem Relation Age of Onset  . AAA (abdominal aortic aneurysm) Mother   . Alzheimer's disease Father   . Cirrhosis Brother     of the liver  . AAA (abdominal aortic aneurysm) Brother     ROS- All systems are reviewed and negative except as per the HPI above  Physical Exam: Filed Vitals:   04/05/15 0844  BP: 140/96  Pulse: 57  Height: 5\' 10"  (1.778 m)  Weight: 288 lb (130.636 kg)    GEN- The patient is well appearing, alert and oriented x 3 today.   Head- normocephalic, atraumatic Eyes-  Sclera clear, conjunctiva pink Ears- hearing intact Oropharynx- clear Neck- supple, no JVP Lymph- no cervical lymphadenopathy Lungs- Clear to ausculation bilaterally, normal work of breathing Heart- Regular rate and rhythm, no murmurs, rubs or gallops, PMI not laterally displaced GI- soft, NT, ND, + BS Extremities- no clubbing, cyanosis, or edema MS- no significant deformity or atrophy Skin- no rash or lesion Psych- euthymic mood, full affect Neuro- strength and sensation are intact  EKG-Sinus brady with PAC's at 57 bpm, pr int 204 ms, qrs int 92 ms, qtc 416 ms Epic records reviewed  Assessment and Plan: 1. Cryptogenic Stroke with new afib by ILR Chadsvasc score of at least 5. Continue xarelto 20 mg qd. Off asa  Bleeding precautions discussed  2. PAF Not symptomatic and very low burden  Does not require any treatment at this time. Continue remote monitoring  3. HTN Rechecked 140/90 Stable  F/u with Dr. Clayton Bibles, 1/19 as scheduled afib clinic as needed Remote clinic as scheduled  Butch Penny C. Lilie Vezina, [facility] [address] [place], [place] [address] [phone]

## 2015-04-06 LAB — CUP PACEART REMOTE DEVICE CHECK: MDC IDC SESS DTM: 20161111140551

## 2015-04-06 NOTE — Progress Notes (Signed)
Carelink summary report received. Battery status OK. Normal device function. No new symptom episodes, tachy episodes, brady, or pause episodes. No new AF episodes, +Xarelto. Monthly summary reports and ROV with GT in 06/2015. 

## 2015-04-09 ENCOUNTER — Ambulatory Visit (INDEPENDENT_AMBULATORY_CARE_PROVIDER_SITE_OTHER): Payer: Medicare Other | Admitting: *Deleted

## 2015-04-09 DIAGNOSIS — I639 Cerebral infarction, unspecified: Secondary | ICD-10-CM | POA: Diagnosis not present

## 2015-04-09 NOTE — Progress Notes (Signed)
Carelink Summary Report / Loop Recorder 

## 2015-05-08 ENCOUNTER — Ambulatory Visit (INDEPENDENT_AMBULATORY_CARE_PROVIDER_SITE_OTHER): Payer: Medicare Other | Admitting: *Deleted

## 2015-05-08 DIAGNOSIS — I639 Cerebral infarction, unspecified: Secondary | ICD-10-CM | POA: Diagnosis not present

## 2015-05-09 NOTE — Progress Notes (Signed)
Carelink Summary Report / Loop Recorder 

## 2015-05-17 ENCOUNTER — Ambulatory Visit: Payer: Medicare Other | Admitting: Interventional Cardiology

## 2015-05-30 LAB — CUP PACEART REMOTE DEVICE CHECK: MDC IDC SESS DTM: 20161211143541

## 2015-06-07 ENCOUNTER — Ambulatory Visit (INDEPENDENT_AMBULATORY_CARE_PROVIDER_SITE_OTHER): Payer: Medicare Other | Admitting: *Deleted

## 2015-06-07 DIAGNOSIS — I639 Cerebral infarction, unspecified: Secondary | ICD-10-CM | POA: Diagnosis not present

## 2015-06-07 NOTE — Progress Notes (Signed)
Carelink Summary Report / Loop Recorder 

## 2015-06-13 NOTE — Progress Notes (Signed)
Patient ID: Eddie Zimmerman, male   DOB: [birth year], 70 y.o.   MRN: [id number]     Cardiology Office Note   Date:  06/14/2015   ID:  Eddie Zimmerman, DOB 06-Nov-1945, MRN [id number]  PCP:  Kandice Hams, MD    No chief complaint on file. PAF   Wt Readings from Last 3 Encounters:  06/14/15 260 lb (117.935 kg)  04/05/15 288 lb (130.636 kg)  01/03/15 259 lb 9.6 oz (117.754 kg)       History of Present Illness: Eddie Zimmerman is a 70 y.o. male with a h/o hyperlipidemia.  He had a cryptogenic stroke with ILR placed 3/16. Brief episode of asymptomatic Afib was noted in 8/16 and pt was referred the Afib clinic to discuss anticoagulation. He was started on xarelto. He denies bleeding problems, since biting his tongue in [place].  It is affordable.  He has done well since then.  He walks 3 miles every other day.  No bleeding problems.  He has his LINQ monitor in place still.  He has the home monitor that transmits information during the day.    A1C has been under 7.    No CP, SHOB, palpitations.  He goes to the gym 3x/week and uses the elliptical.  He does some weights as well. 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      Past Medical History  Diagnosis Date  . Diabetes mellitus without complication ([place])   . Hypertension   . Gout   . Depression   . Arthritis     hands, wrist & Knee   . Stroke ([place]) 06-07-2014  . Sleep apnea     cpap use, SETTING OF 13  . History of kidney stones LAST MAY 2015    Past Surgical History  Procedure Laterality Date  . Laparoscopic gastric banding    . Knee surgery Left   . Hernia repair      abdominal  . Joint replacement Left   . Tonsillectomy    . Arthroscopic repair pcl Bilateral     right- x1, left- x3   . Total knee arthroplasty Right 06/07/2014    Procedure: TOTAL KNEE ARTHROPLASTY;  Surgeon: Kerin Salen, MD;  Location: [facility];  Service: Orthopedics;  Laterality: Right;  . Tarsal tunnel release Bilateral 2005  . Tee  without cardioversion N/A 06/14/2014    Procedure: TRANSESOPHAGEAL ECHOCARDIOGRAM (TEE);  Surgeon: Sueanne Margarita, MD;  Location: [facility];  Service: Cardiovascular;  Laterality: N/A;  . Loop recorder implant N/A 07/12/2014    Procedure: LOOP RECORDER IMPLANT;  Surgeon: Evans Lance, MD;  Location: [facility] CATH LAB;  Service: Cardiovascular;  Laterality: N/A;  . Colonoscopy with propofol N/A 10/24/2014    Procedure: COLONOSCOPY WITH PROPOFOL;  Surgeon: Garlan Fair, MD;  [facility];  Service: Endoscopy;  Laterality: N/A;     Current Outpatient Prescriptions  Medication Sig Dispense Refill  . allopurinol (ZYLOPRIM) 300 MG tablet Take 300 mg by mouth daily at 12 noon.     . B Complex Vitamins (B COMPLEX PO) Take 1 tablet by mouth daily at 12 noon.     . Coenzyme Q10 (CO Q 10) 100 MG CAPS Take 200 mg by mouth daily.    . CRESTOR 5 MG tablet TAKE 1 TABLET DAILY 45 tablet 0  . escitalopram (LEXAPRO) 10 MG tablet Take 10 mg by mouth daily at 12 noon.     [name] lisinopril (PRINIVIL,ZESTRIL) 20  MG tablet Take 20 mg by mouth daily.    [name] LYRICA 75 MG capsule Take 1 capsule by mouth daily.    . Multiple Vitamin (MULTIVITAMIN PO) Take 1 tablet by mouth daily at 12 noon.     . pioglitazone (ACTOS) 15 MG tablet Take 15 mg by mouth daily at 12 noon.     Vladimir Faster Glycol-Propyl Glycol (SYSTANE ULTRA) 0.4-0.3 % SOLN Place 2 drops into both eyes daily.     [name] RETIN-A 0.1 % cream Apply 1 application topically every 30 (thirty) days.    . rivaroxaban (XARELTO) 20 MG TABS tablet Take 1 tablet (20 mg total) by mouth daily with supper. 90 tablet 3   No current facility-administered medications for this visit.    Allergies:   Review of patient's allergies indicates no known allergies.    Social History:  The patient  reports that he has never smoked. He has never used smokeless tobacco. He reports that he drinks alcohol. He reports that he does not use illicit drugs.   Family History:  The  patient's family history includes AAA (abdominal aortic aneurysm) in his brother and mother; Alzheimer's disease in his father; Cirrhosis in his brother; Hypertension in his brother and mother. There is no history of Heart attack.    ROS:  Please see the history of present illness.   Otherwise, review of systems are positive for intentional weight loss.  He thinks he was 268 in 12/16, not 288.  No residual issues after the stroke.   All other systems are reviewed and negative.    PHYSICAL EXAM: VS:  BP 135/90 mmHg  Pulse 58  Ht 5\' 10"  (1.778 m)  Wt 260 lb (117.935 kg)  BMI 37.31 kg/m2 , BMI Body mass index is 37.31 kg/(m^2). GEN: Well nourished, well developed, in no acute distress HEENT: normal Neck: no JVD, carotid bruits, or masses Cardiac: RRR; no murmurs, rubs, or gallops,no edema  Respiratory:  clear to auscultation bilaterally, normal work of breathing GI: soft, nontender, nondistended, + BS, can palpate port from lap band MS: no deformity or atrophy Skin: warm and dry, no rash Neuro:  Strength and sensation are intact Psych: euthymic mood, full affect   EKG:   The ekg ordered today demonstrates sinus bradycardia, nonspecific ST segment changes   Recent Labs: 12/12/2014: BUN 10; Creatinine, Ser 0.85; Hemoglobin 14.9; Platelets 201; Potassium 4.3; Sodium 142   Lipid Panel    Component Value Date/Time   CHOL 125 06/09/2014 0543   TRIG 80 06/09/2014 0543   HDL 37* 06/09/2014 0543   CHOLHDL 3.4 06/09/2014 0543   VLDL 16 06/09/2014 0543   LDLCALC 72 06/09/2014 0543     Other studies Reviewed: Additional studies/ records that were reviewed today with results demonstrating: Hospital records from the time of his stroke reviewed.   ASSESSMENT AND PLAN:  1. PAF: Maintianing NSR.  Xarelto for anticoagulation for stroke prevention. 2. HTN: Fairly well controlled. Continue lisinopril. Continue regular exercise.  3. Hyperlipidemia: We'll recheck lipids at this time.  Well-controlled in February 2016.  4. DM: Well-controlled. Managed by primary care physician.  5. Obesity: He had lap band surgery several years ago.  Weight has plateaued in this range.  Continue to try to exercise to help with further weight loss.   Current medicines are reviewed at length with the patient today.  The patient concerns regarding his medicines were addressed.  The following changes have been made:  No change  Labs/ tests ordered  today include:  No orders of the defined types were placed in this encounter.    Recommend 150 minutes/week of aerobic exercise Low fat, low carb, high fiber diet recommended  Disposition:   FU in 9 months   Teresita Madura., MD  06/14/2015 9:28 AM    [facility] [place], [place], [place]  [address] Phone: [phone]; Fax: [phone]

## 2015-06-14 ENCOUNTER — Encounter: Payer: Self-pay | Admitting: Interventional Cardiology

## 2015-06-14 ENCOUNTER — Ambulatory Visit (INDEPENDENT_AMBULATORY_CARE_PROVIDER_SITE_OTHER): Payer: Medicare Other | Admitting: Interventional Cardiology

## 2015-06-14 VITALS — BP 135/90 | HR 58 | Ht 70.0 in | Wt 260.0 lb

## 2015-06-14 DIAGNOSIS — E782 Mixed hyperlipidemia: Secondary | ICD-10-CM

## 2015-06-14 DIAGNOSIS — I480 Paroxysmal atrial fibrillation: Secondary | ICD-10-CM

## 2015-06-14 DIAGNOSIS — E119 Type 2 diabetes mellitus without complications: Secondary | ICD-10-CM

## 2015-06-14 DIAGNOSIS — I639 Cerebral infarction, unspecified: Secondary | ICD-10-CM

## 2015-06-14 DIAGNOSIS — I10 Essential (primary) hypertension: Secondary | ICD-10-CM | POA: Diagnosis not present

## 2015-06-14 HISTORY — DX: Paroxysmal atrial fibrillation: I48.0

## 2015-06-14 NOTE — Patient Instructions (Signed)
Medication Instructions:  Same-no changes  Labwork: Tomorrow-CMET and Lipids  Testing/Procedures: None  Follow-Up: Your physician wants you to follow-up in: 9 months. You will receive a reminder letter in the mail two months in advance. If you don't receive a letter, please call our office to schedule the follow-up appointment.     If you need a refill on your cardiac medications before your next appointment, please call your pharmacy.

## 2015-06-15 ENCOUNTER — Other Ambulatory Visit (INDEPENDENT_AMBULATORY_CARE_PROVIDER_SITE_OTHER): Payer: Medicare Other | Admitting: *Deleted

## 2015-06-15 DIAGNOSIS — E782 Mixed hyperlipidemia: Secondary | ICD-10-CM

## 2015-06-15 LAB — COMPREHENSIVE METABOLIC PANEL
ALBUMIN: 4.2 g/dL (ref 3.6–5.1)
ALK PHOS: 54 U/L (ref 40–115)
ALT: 21 U/L (ref 9–46)
AST: 28 U/L (ref 10–35)
BILIRUBIN TOTAL: 0.4 mg/dL (ref 0.2–1.2)
BUN: 10 mg/dL (ref 7–25)
CALCIUM: 9 mg/dL (ref 8.6–10.3)
CO2: 25 mmol/L (ref 20–31)
Chloride: 106 mmol/L (ref 98–110)
Creat: 0.92 mg/dL (ref 0.70–1.25)
Glucose, Bld: 121 mg/dL — ABNORMAL HIGH (ref 65–99)
Potassium: 4.3 mmol/L (ref 3.5–5.3)
Sodium: 141 mmol/L (ref 135–146)
TOTAL PROTEIN: 6.9 g/dL (ref 6.1–8.1)

## 2015-06-15 LAB — LIPID PANEL
CHOLESTEROL: 135 mg/dL (ref 125–200)
HDL: 34 mg/dL — AB (ref >=40)
LDL CALC: 74 mg/dL (ref <130)
TRIGLYCERIDES: 136 mg/dL (ref <150)
Total CHOL/HDL Ratio: 4 Ratio (ref <=5.0)
VLDL: 27 mg/dL (ref <30)

## 2015-06-15 NOTE — Addendum Note (Signed)
Addended by: Eulis Foster on: 06/15/2015 07:35 AM   Modules accepted: Orders

## 2015-06-26 ENCOUNTER — Telehealth: Payer: Self-pay | Admitting: Interventional Cardiology

## 2015-06-26 NOTE — Telephone Encounter (Signed)
Informed wife of results, DPR on file. Wife verbalized understanding.

## 2015-06-26 NOTE — Telephone Encounter (Signed)
Returning call from [place] from yesterday.

## 2015-06-27 LAB — CUP PACEART REMOTE DEVICE CHECK: Date Time Interrogation Session: 20170110150706

## 2015-06-27 NOTE — Progress Notes (Signed)
Carelink summary report received. Battery status OK. Normal device function. No new symptom episodes, tachy episodes, brady, or pause episodes. No new AF episodes. Monthly summary reports and ROV/PRN 

## 2015-06-28 ENCOUNTER — Other Ambulatory Visit: Payer: Self-pay | Admitting: Interventional Cardiology

## 2015-06-28 DIAGNOSIS — M79671 Pain in right foot: Secondary | ICD-10-CM | POA: Diagnosis not present

## 2015-07-09 ENCOUNTER — Ambulatory Visit (INDEPENDENT_AMBULATORY_CARE_PROVIDER_SITE_OTHER): Payer: Medicare Other | Admitting: *Deleted

## 2015-07-09 DIAGNOSIS — I639 Cerebral infarction, unspecified: Secondary | ICD-10-CM | POA: Diagnosis not present

## 2015-07-09 NOTE — Progress Notes (Signed)
Carelink Summary Report / Loop Recorder 

## 2015-07-18 ENCOUNTER — Encounter: Payer: Self-pay | Admitting: Internal Medicine

## 2015-07-18 ENCOUNTER — Ambulatory Visit (INDEPENDENT_AMBULATORY_CARE_PROVIDER_SITE_OTHER): Payer: Medicare Other | Admitting: Internal Medicine

## 2015-07-18 VITALS — BP 122/72 | HR 74 | Ht 70.0 in | Wt 262.0 lb

## 2015-07-18 DIAGNOSIS — I639 Cerebral infarction, unspecified: Secondary | ICD-10-CM

## 2015-07-18 DIAGNOSIS — I480 Paroxysmal atrial fibrillation: Secondary | ICD-10-CM | POA: Diagnosis not present

## 2015-07-18 LAB — CUP PACEART INCLINIC DEVICE CHECK: Date Time Interrogation Session: 20170322105321

## 2015-07-18 NOTE — Progress Notes (Signed)
HPI Mr. Cejas returns today for implantable loop recorder followup. The patient is a very pleasant 69 year old man who was sustained a stroke several months ago. Subsequent MRI demonstrated a remote stroke which was clinically silent. His symptoms resolved within an hour. Transesophageal echo has demonstrated no obvious etiology for his stroke, and telemetry monitoring while in the hospital demonstrated no atrial arrhythmias. Since his ILR, he has had no recurrent symptoms but has had documented atrial fib and is now on Xarelto. He has no other complaints. No Known Allergies   Current Outpatient Prescriptions  Medication Sig Dispense Refill  . allopurinol (ZYLOPRIM) 300 MG tablet Take 300 mg by mouth daily at 12 noon.     . B Complex Vitamins (B COMPLEX PO) Take 1 tablet by mouth daily at 12 noon.     . Coenzyme Q10 (CO Q 10) 100 MG CAPS Take 200 mg by mouth daily.    . CRESTOR 5 MG tablet TAKE 1 TABLET DAILY 45 tablet 9  . escitalopram (LEXAPRO) 10 MG tablet Take 10 mg by mouth daily at 12 noon.     [name] lisinopril (PRINIVIL,ZESTRIL) 20 MG tablet Take 20 mg by mouth daily.    [name] LYRICA 75 MG capsule Take 1 capsule by mouth daily.    . Multiple Vitamin (MULTIVITAMIN PO) Take 1 tablet by mouth daily at 12 noon.     . pioglitazone (ACTOS) 15 MG tablet Take 15 mg by mouth daily at 12 noon.     Vladimir Faster Glycol-Propyl Glycol (SYSTANE ULTRA) 0.4-0.3 % SOLN Place 2 drops into both eyes daily.     [name] RETIN-A 0.1 % cream Apply 1 application topically every 30 (thirty) days.    . rivaroxaban (XARELTO) 20 MG TABS tablet Take 1 tablet (20 mg total) by mouth daily with supper. 90 tablet 3   No current facility-administered medications for this visit.     Past Medical History  Diagnosis Date  . Diabetes mellitus without complication ([place])   . Hypertension   . Gout   . Depression   . Arthritis     hands, wrist & Knee   . Stroke ([place]) 06-07-2014  . Sleep apnea     cpap use, SETTING OF 13    . History of kidney stones LAST MAY 2015    ROS:   All systems reviewed and negative except as noted in the HPI.   Past Surgical History  Procedure Laterality Date  . Laparoscopic gastric banding    . Knee surgery Left   . Hernia repair      abdominal  . Joint replacement Left   . Tonsillectomy    . Arthroscopic repair pcl Bilateral     right- x1, left- x3   . Total knee arthroplasty Right 06/07/2014    Procedure: TOTAL KNEE ARTHROPLASTY;  Surgeon: Kerin Salen, MD;  Location: [facility];  Service: Orthopedics;  Laterality: Right;  . Tarsal tunnel release Bilateral 2005  . Tee without cardioversion N/A 06/14/2014    Procedure: TRANSESOPHAGEAL ECHOCARDIOGRAM (TEE);  Surgeon: Sueanne Margarita, MD;  Location: [facility];  Service: Cardiovascular;  Laterality: N/A;  . Loop recorder implant N/A 07/12/2014    Procedure: LOOP RECORDER IMPLANT;  Surgeon: Evans Lance, MD;  Location[facility] C[facility];  Service: Cardiovascular;  Laterality: N/A;  . Colonoscopy with propofol N/A 10/24/2014    Procedure: COLONOSCOPY WITH PROPOFOL;  Surgeon: Garlan Fair, MD;  Location: [facility];  Service: Endoscopy;  Laterality:  N/A;     Family History  Problem Relation Age of Onset  . AAA (abdominal aortic aneurysm) Mother   . Alzheimer's disease Father   . Cirrhosis Brother     of the liver  . AAA (abdominal aortic aneurysm) Brother   . Heart attack Neg Hx   . Hypertension Mother   . Hypertension Brother      Social History   Social History  . Marital Status: Married    Spouse Name: [name]  . Number of Children: 3  . Years of Education: Master's   Occupational History  . [employer]    Social History Main Topics  . Smoking status: Never Smoker   . Smokeless tobacco: Never Used  . Alcohol Use: 0.0 oz/week    0 Standard drinks or equivalent per week     Comment: rare 1 PER month  . Drug Use: No  . Sexual Activity: Not on file   Other Topics Concern  . Not on file   Social History  Narrative   Lives at home with wife.   Left handed.    Caffeine use: Drinks 1 glass coffee, tea, soda per day. Varies.     BP 122/72 mmHg  Pulse 74  Ht 5\' 10"  (1.778 m)  Wt 262 lb (118.842 kg)  BMI 37.59 kg/m2  Physical Exam:  Well appearing 70 year old man, NAD HEENT: Unremarkable Neck:  No JVD, no thyromegally Back:  No CVA tenderness Lungs:  Clear, with no wheezes, rales, or rhonchi.  HEART:  Regular rate rhythm, no murmurs, no rubs, no clicks Abd:  soft, positive bowel sounds, no organomegally, no rebound, no guarding Ext:  2 plus pulses, no edema, no cyanosis, no clubbing Skin:  No rashes no nodules Neuro:  CN II through XII intact, motor grossly intact   Assess/Plan: 1. Cryptogenic stroke - he has had no recurrent symptoms.  2. Atrial fib - he is asymptomatic with no recurrent atrial fib. Will follow. 3. HTN - his blood pressure is well controlled. No change in meds.   Mikle Bosworth.D.

## 2015-07-18 NOTE — Patient Instructions (Signed)

## 2015-08-06 ENCOUNTER — Ambulatory Visit (INDEPENDENT_AMBULATORY_CARE_PROVIDER_SITE_OTHER): Payer: Medicare Other | Admitting: *Deleted

## 2015-08-06 DIAGNOSIS — I639 Cerebral infarction, unspecified: Secondary | ICD-10-CM

## 2015-08-06 NOTE — Progress Notes (Signed)
Carelink Summary Report / Loop Recorder 

## 2015-08-18 LAB — CUP PACEART REMOTE DEVICE CHECK: Date Time Interrogation Session: 20170209150728

## 2015-08-18 NOTE — Progress Notes (Signed)
Carelink summary report received. Battery status OK. Normal device function. No new symptom episodes, tachy episodes, brady, or pause episodes. 1 AF 0% available ECG appears slightly irregular, UTD due to noise/artifact. +Xarelto Monthly summary reports and ROV/PRN

## 2015-08-27 DIAGNOSIS — H6501 Acute serous otitis media, right ear: Secondary | ICD-10-CM | POA: Diagnosis not present

## 2015-08-27 DIAGNOSIS — J209 Acute bronchitis, unspecified: Secondary | ICD-10-CM | POA: Diagnosis not present

## 2015-09-05 ENCOUNTER — Ambulatory Visit (INDEPENDENT_AMBULATORY_CARE_PROVIDER_SITE_OTHER): Payer: Medicare Other | Admitting: *Deleted

## 2015-09-05 DIAGNOSIS — I639 Cerebral infarction, unspecified: Secondary | ICD-10-CM

## 2015-09-05 NOTE — Progress Notes (Signed)
Carelink Summary Report / Loop Recorder 

## 2015-09-19 LAB — CUP PACEART REMOTE DEVICE CHECK: Date Time Interrogation Session: 20170311153740

## 2015-09-23 LAB — CUP PACEART REMOTE DEVICE CHECK: MDC IDC SESS DTM: 20170410160557

## 2015-09-23 NOTE — Progress Notes (Signed)
Carelink summary report received. Battery status OK. Normal device function. No new symptom episodes, tachy episodes, pause episodes. No new AF episodes.1 brady episode - undersensing. Monthly summary reports and ROV/PRN

## 2015-09-27 DIAGNOSIS — E1140 Type 2 diabetes mellitus with diabetic neuropathy, unspecified: Secondary | ICD-10-CM | POA: Diagnosis not present

## 2015-09-27 DIAGNOSIS — Z1389 Encounter for screening for other disorder: Secondary | ICD-10-CM | POA: Diagnosis not present

## 2015-09-27 DIAGNOSIS — E7800 Pure hypercholesterolemia, unspecified: Secondary | ICD-10-CM | POA: Diagnosis not present

## 2015-09-27 DIAGNOSIS — Z0000 Encounter for general adult medical examination without abnormal findings: Secondary | ICD-10-CM | POA: Diagnosis not present

## 2015-09-27 DIAGNOSIS — Z7984 Long term (current) use of oral hypoglycemic drugs: Secondary | ICD-10-CM | POA: Diagnosis not present

## 2015-09-27 DIAGNOSIS — I639 Cerebral infarction, unspecified: Secondary | ICD-10-CM | POA: Diagnosis not present

## 2015-09-27 DIAGNOSIS — I10 Essential (primary) hypertension: Secondary | ICD-10-CM | POA: Diagnosis not present

## 2015-09-27 DIAGNOSIS — I480 Paroxysmal atrial fibrillation: Secondary | ICD-10-CM | POA: Diagnosis not present

## 2015-10-05 ENCOUNTER — Ambulatory Visit (INDEPENDENT_AMBULATORY_CARE_PROVIDER_SITE_OTHER): Payer: Medicare Other | Admitting: *Deleted

## 2015-10-05 DIAGNOSIS — I639 Cerebral infarction, unspecified: Secondary | ICD-10-CM | POA: Diagnosis not present

## 2015-10-08 NOTE — Progress Notes (Signed)
Carelink Summary Report / Loop Recorder 

## 2015-10-12 LAB — CUP PACEART REMOTE DEVICE CHECK
Date Time Interrogation Session: 20170609170539
MDC IDC SESS DTM: 20170510163657

## 2015-11-05 ENCOUNTER — Ambulatory Visit (INDEPENDENT_AMBULATORY_CARE_PROVIDER_SITE_OTHER): Payer: Medicare Other | Admitting: *Deleted

## 2015-11-05 DIAGNOSIS — I639 Cerebral infarction, unspecified: Secondary | ICD-10-CM

## 2015-11-05 NOTE — Progress Notes (Signed)
Carelink Summary Report / Loop Recorder 

## 2015-11-16 DIAGNOSIS — H5213 Myopia, bilateral: Secondary | ICD-10-CM | POA: Diagnosis not present

## 2015-11-16 DIAGNOSIS — H52223 Regular astigmatism, bilateral: Secondary | ICD-10-CM | POA: Diagnosis not present

## 2015-11-16 DIAGNOSIS — H524 Presbyopia: Secondary | ICD-10-CM | POA: Diagnosis not present

## 2015-11-16 DIAGNOSIS — E113293 Type 2 diabetes mellitus with mild nonproliferative diabetic retinopathy without macular edema, bilateral: Secondary | ICD-10-CM | POA: Diagnosis not present

## 2015-11-16 DIAGNOSIS — H0520 Unspecified exophthalmos: Secondary | ICD-10-CM | POA: Diagnosis not present

## 2015-11-16 DIAGNOSIS — H25813 Combined forms of age-related cataract, bilateral: Secondary | ICD-10-CM | POA: Diagnosis not present

## 2015-11-16 DIAGNOSIS — H11441 Conjunctival cysts, right eye: Secondary | ICD-10-CM | POA: Diagnosis not present

## 2015-12-04 ENCOUNTER — Ambulatory Visit (INDEPENDENT_AMBULATORY_CARE_PROVIDER_SITE_OTHER): Payer: Medicare Other | Admitting: *Deleted

## 2015-12-04 DIAGNOSIS — I639 Cerebral infarction, unspecified: Secondary | ICD-10-CM | POA: Diagnosis not present

## 2015-12-04 NOTE — Progress Notes (Signed)
Carelink Summary Report / Loop Recorder 

## 2015-12-06 LAB — CUP PACEART REMOTE DEVICE CHECK: MDC IDC SESS DTM: 20170709180737

## 2015-12-11 LAB — CUP PACEART REMOTE DEVICE CHECK: Date Time Interrogation Session: 20170808180934

## 2015-12-12 ENCOUNTER — Other Ambulatory Visit (HOSPITAL_COMMUNITY): Payer: Self-pay | Admitting: Nurse Practitioner

## 2015-12-25 DIAGNOSIS — M79671 Pain in right foot: Secondary | ICD-10-CM | POA: Diagnosis not present

## 2015-12-25 DIAGNOSIS — M79672 Pain in left foot: Secondary | ICD-10-CM | POA: Diagnosis not present

## 2016-01-03 ENCOUNTER — Ambulatory Visit (INDEPENDENT_AMBULATORY_CARE_PROVIDER_SITE_OTHER): Payer: Medicare Other | Admitting: *Deleted

## 2016-01-03 DIAGNOSIS — I639 Cerebral infarction, unspecified: Secondary | ICD-10-CM

## 2016-01-03 NOTE — Progress Notes (Signed)
Carelink Summary Report / Loop Recorder 

## 2016-01-04 DIAGNOSIS — R310 Gross hematuria: Secondary | ICD-10-CM | POA: Diagnosis not present

## 2016-01-11 DIAGNOSIS — N202 Calculus of kidney with calculus of ureter: Secondary | ICD-10-CM | POA: Diagnosis not present

## 2016-01-11 DIAGNOSIS — R310 Gross hematuria: Secondary | ICD-10-CM | POA: Diagnosis not present

## 2016-01-15 DIAGNOSIS — R310 Gross hematuria: Secondary | ICD-10-CM | POA: Diagnosis not present

## 2016-01-15 DIAGNOSIS — N200 Calculus of kidney: Secondary | ICD-10-CM | POA: Diagnosis not present

## 2016-01-15 DIAGNOSIS — N359 Urethral stricture, unspecified: Secondary | ICD-10-CM | POA: Diagnosis not present

## 2016-01-18 ENCOUNTER — Other Ambulatory Visit: Payer: Self-pay | Admitting: Urology

## 2016-01-18 NOTE — Progress Notes (Signed)
Scheduling for pre op-  Please place SURGICAL ORDERS IN EPIC

## 2016-01-21 NOTE — Patient Instructions (Addendum)
Eddie Zimmerman  01/21/2016   Your procedure is scheduled on: Thursday 01/31/2016  Report to [facility]  Entrance take [place]  elevators to 3rd floor to  [place] at  [place]  AM.  Call this number if you have problems the morning of surgery [phone]   Remember: ONLY 1 PERSON MAY GO WITH YOU TO SHORT STAY TO GET  READY MORNING OF [place].             BRING CPAP MASK AND TUBING WITH YOU TO HOSPITAL MORNING OF SURGERY!   Do not eat food or drink liquids :After Midnight.      Take these medicines the morning of surgery with A SIP OF WATER: none              DO NOT TAKE ANY DIABETIC MEDICATIONS DAY OF YOUR SURGERY!                               You may not have any metal on your body including hair pins and              piercings  Do not wear jewelry, make-up, lotions, powders or perfumes, deodorant             Do not wear nail polish.  Do not shave  48 hours prior to surgery.              Men may shave face and neck.   Do not bring valuables to the hospital. [place].  Contacts, dentures or bridgework may not be worn into surgery.  Leave suitcase in the car. After surgery it may be brought to your room.     Patients discharged the day of surgery will not be allowed to drive home.  Name and phone number of your driver: SPOUSE- Tryphena Perkovich  Special Instructions: N/A              Please read over the following fact sheets you were given: _____________________________________________________________________             [facility] - Preparing for Surgery Before surgery, you can play an important role.  Because skin is not sterile, your skin needs to be as free of germs as possible.  You can reduce the number of germs on your skin by washing with CHG (chlorahexidine gluconate) soap before surgery.  CHG is an antiseptic cleaner which kills germs and bonds with the skin to continue killing germs  even after washing. Please DO NOT use if you have an allergy to CHG or antibacterial soaps.  If your skin becomes reddened/irritated stop using the CHG and inform your nurse when you arrive at Short Stay. Do not shave (including legs and underarms) for at least 48 hours prior to the first CHG shower.  You may shave your face/neck. Please follow these instructions carefully:  1.  Shower with CHG Soap the night before surgery and the  morning of Surgery.  2.  If you choose to wash your hair, wash your hair first as usual with your  normal  shampoo.  3.  After you shampoo, rinse your hair and body thoroughly to remove the  shampoo.  4.  Use CHG as you would any other liquid soap.  You can apply chg directly  to the skin and wash                       Gently with a scrungie or clean washcloth.  5.  Apply the CHG Soap to your body ONLY FROM THE NECK DOWN.   Do not use on face/ open                           Wound or open sores. Avoid contact with eyes, ears mouth and genitals (private parts).                       Wash face,  Genitals (private parts) with your normal soap.             6.  Wash thoroughly, paying special attention to the area where your surgery  will be performed.  7.  Thoroughly rinse your body with warm water from the neck down.  8.  DO NOT shower/wash with your normal soap after using and rinsing off  the CHG Soap.                9.  Pat yourself dry with a clean towel.            10.  Wear clean pajamas.            11.  Place clean sheets on your bed the night of your first shower and do not  sleep with pets. Day of Surgery : Do not apply any lotions/deodorants the morning of surgery.  Please wear clean clothes to the hospital/surgery center.  FAILURE TO FOLLOW THESE INSTRUCTIONS MAY RESULT IN THE CANCELLATION OF YOUR SURGERY PATIENT SIGNATURE_________________________________  NURSE  SIGNATURE__________________________________  ________________________________________________________________________

## 2016-01-22 ENCOUNTER — Encounter (HOSPITAL_COMMUNITY): Payer: Self-pay

## 2016-01-22 ENCOUNTER — Encounter (HOSPITAL_COMMUNITY)
Admission: RE | Admit: 2016-01-22 | Discharge: 2016-01-22 | Disposition: A | Discharge status: Home / Self Care | Payer: Medicare Other | Source: Ambulatory Visit | Attending: Urology | Admitting: Urology

## 2016-01-22 DIAGNOSIS — Z01812 Encounter for preprocedural laboratory examination: Secondary | ICD-10-CM | POA: Diagnosis not present

## 2016-01-22 LAB — BASIC METABOLIC PANEL
Anion gap: 9 (ref 5–15)
BUN: 16 mg/dL (ref 6–20)
CHLORIDE: 106 mmol/L (ref 101–111)
CO2: 27 mmol/L (ref 22–32)
Calcium: 9.5 mg/dL (ref 8.9–10.3)
Creatinine, Ser: 1.05 mg/dL (ref 0.61–1.24)
GFR calc Af Amer: >60 mL/min (ref >60)
GFR calc non Af Amer: >60 mL/min (ref >60)
GLUCOSE: 152 mg/dL — AB (ref 65–99)
POTASSIUM: 5.1 mmol/L (ref 3.5–5.1)
Sodium: 142 mmol/L (ref 135–145)

## 2016-01-22 LAB — CBC
HEMATOCRIT: 44.2 % (ref 39.0–52.0)
Hemoglobin: 14.6 g/dL (ref 13.0–17.0)
MCH: 30.4 pg (ref 26.0–34.0)
MCHC: 33 g/dL (ref 30.0–36.0)
MCV: 92.1 fL (ref 78.0–100.0)
Platelets: 205 10*3/uL (ref 150–400)
RBC: 4.8 MIL/uL (ref 4.22–5.81)
RDW: 13.9 % (ref 11.5–15.5)
WBC: 5.1 10*3/uL (ref 4.0–10.5)

## 2016-01-30 NOTE — H&P (Signed)
Office Visit Report     01/15/2016   --------------------------------------------------------------------------------   Eddie Zimmerman  MRN: [id number]  PRIMARY CARE:  [name]  DOB: 12-27-1945, 70 year old Male  REFERRING:  [name], NP  SSN: [id number]  PROVIDER:  [name], M.D.    LOCATION:  [facility] [phone]   --------------------------------------------------------------------------------   CC/HPI: Eddie Zimmerman hematuria    Eddie Zimmerman follows up today after having developed fairly severe gross hematuria approximately 1 week ago. He does take Xarelto for atrial fibrillation. He has a known history of stone disease and urethral stricture disease. He also has a history of BPH. He was seen 1 week ago by [name]. She did order a CT scan of the abdomen and pelvis for further evaluation. This demonstrated bilateral nonobstructive renal calculi with the largest stone measuring 7 x 7 mm in the right renal pelvis. There was also evidence of multiple cystic lesions including a complex cystic lesion that appeared to be multiseptated measuring 4.4 cm in the lower pole left kidney. There also was what appeared to be a hyperdense 2.6 cm lower pole right renal cystic lesion. No other clear abnormalities or cause for his hematuria was noted.     ALLERGIES: No Allergies    MEDICATIONS: Hydrochlorothiazide 12.5 mg capsule TAKE 1 CAPSULE DAILY  Actos 15 MG Oral Tablet Oral  Allopurinol TABS Oral  HydroCHLOROthiazide 12.5 MG Oral Capsule 0 Oral Daily  Lexapro 10 MG Oral Tablet Oral  Lipitor 10 MG Oral Tablet Oral  Lisinopril 20 MG Oral Tablet Oral  Tamsulosin HCl - 0.4 MG Oral Capsule Oral  Tramadol Hcl 50 mg tablet 1 tablet PO Q 6 H PRN  Xarelto TABS Oral     GU PSH: Cystoscopy Insert Stent - 2011 Locm 300-399Mg /Ml Iodine,1Ml - 01/11/2016 Renal ESWL - 2011      PSH Notes: Tarsal Osteotomy  Knee Replacement   NON-GU PSH: Elbow Arthroscopy Gastric Bypass  For Obesity Knee Arthroscopy; Dx - 2008 Revise Knee Joint - 01/10/2015, 2008    GU PMH: Gross hematuria (Acute), Will proceed with hematuria workup with CT and cysto - 01/04/2016 Encounter for Prostate Cancer screening, Prostate cancer screening - 12/31/2014 Kidney Stone, Nephrolithiasis - 12/31/2014 Calculus Ureter, Calculus of ureter - 2014 Personal Hx urinary calculi, Nephrolithiasis - 2014 Urethral Stricture, Unspec, Urethral stricture - 2014 Urinary Retention, Unspec, Urinary retention - 2014      PMH Notes:   1) Hematuria: He developed gross hematuria in May 2009 associated with some left abdominal and flank pain. He does have a history of nephrolithiasis.   May 2009: Cysto, cytology - negative , CT - multiple bilateral non-obstructing renal calculi   2) Nephrolithiasis: He has a history of calcium oxalate monohydrate and uric acid stones. He has a history of gout and diabetes. He has been noted to have hyperoxaluria successfully treated with calcium supplementation but then developed hypercalciuria and was recommended to begin a thiazide diuretic.   Current treatment: Dietary modification, calcium citrate, HCTZ 12.5 mg   Prior treatment:  Aug 2011: ESWL  Sep 2015: Low urine volume   3) Urinary retention/urethral stricture disease: He performs CIC as needed if he develops a weakened stream.   4) Ejaculatory duct obstruction: He is status post a transurethral resection of his ejaculatory ducts 1998 in [place], [place].   5) Prostate cancer screening: He receives screening through Dr. Delfina Redwood.     NON-GU PMH: Personal history of other diseases of the circulatory  system, History of atrial fibrillation - 01/10/2015, History of hypertension, - 2014 Personal history of transient ischemic attack (TIA), and cerebral infarction without residual deficits, History of stroke - 01/10/2015 Gout, Gout - 2014 Personal history of other diseases of the nervous system and sense organs, History of  sleep apnea - 2014 Personal history of other endocrine, nutritional and metabolic disease, History of diabetes mellitus - 2014    FAMILY HISTORY: Alzheimer's Disease - Father Hematuria - Runs In Family nephrolithiasis - Mother   SOCIAL HISTORY: Marital Status: Married Current Smoking Status: Patient has never smoked.  Social Drinker.  Drinks 2 caffeinated drinks per day.    REVIEW OF SYSTEMS:    GU Review Male:   Patient denies frequent urination, hard to postpone urination, burning/ pain with urination, get up at night to urinate, leakage of urine, stream starts and stops, trouble starting your streams, and have to strain to urinate .  Gastrointestinal (Upper):   Patient denies nausea and vomiting.  Gastrointestinal (Lower):   Patient denies diarrhea and constipation.  Constitutional:   Patient denies fever, night sweats, weight loss, and fatigue.  Skin:   Patient denies skin rash/ lesion and itching.  Eyes:   Patient denies blurred vision and double vision.  Ears/ Nose/ Throat:   Patient denies sore throat and sinus problems.  Hematologic/Lymphatic:   Patient denies swollen glands and easy bruising.  Cardiovascular:   Patient denies leg swelling and chest pains.  Respiratory:   Patient denies cough and shortness of breath.  Endocrine:   Patient denies excessive thirst.  Musculoskeletal:   Patient denies back pain and joint pain.  Neurological:   Patient denies headaches and dizziness.  Psychologic:   Patient denies anxiety and depression.   VITAL SIGNS:      01/15/2016 02:48 PM  BP 143/81 mmHg  Pulse 61 /min   MULTI-SYSTEM PHYSICAL EXAMINATION:    Constitutional: Well-nourished. No physical deformities. Normally developed. Good grooming.  Respiratory: No labored breathing, no use of accessory muscles.   Cardiovascular: Normal temperature, normal extremity pulses, no swelling, no varicosities.  Gastrointestinal: No mass, no tenderness, no rigidity,      PAST DATA REVIEWED:   Source Of History:  Patient  Urine Test Review:   Urinalysis  X-Ray Review: C.T. Hematuria: Reviewed Films.     03/21/08 02/09/07  PSA  Total PSA 0.10  0.09     PROCEDURES:         Flexible Cystoscopy - 52000  Indication:  Risks, benefits, and potential complications of the procedure were discussed with the patient including infection, bleeding, voiding discomfort, urinary retention, fever, chills, sepsis, and others. All questions were answered. Informed consent was obtained. Sterile technique and intraurethral analgesia were used.  Meatus:  Normal size. Normal location. Normal condition.  Urethra:  On inspection of the anterior urethra, the patient was noted to have a very tight anterior urethral stricture in the pendulous urethra which did not allow passage of the flexible cystoscope. This procedure was therefore stopped. I did not attempt to dilate him in the office considering the fact that he was on anticoagulation.      Chaperone: The procedure was well-tolerated and without complications. Instructions were given to call the office immediately if questions or problems.         Urinalysis w/Scope - 81001 Dipstick Dipstick Cont'd Micro  Specimen: Voided Bilirubin: Neg WBC/hpf: 0-5/hpf  Color: Brown Ketones: Trace RBC/hpf: >60/hpf  Appearance: Turbid Blood: 3+ Bacteria: Rare  Specific Gravity: 1.020 Protein:  2+ Cystals: Amorph Urates  pH: 5.0 Urobilinogen: 0.2 Casts: NS (Not Seen)  Glucose: Neg Nitrites: Neg Trichomonas: Not Present    Leukocyte Esterase: 2+ Mucous: Not Present      Epithelial Cells: NS (Not Seen)      Yeast: NS (Not Seen)      Sperm: Not Present    ASSESSMENT:      ICD-10 Details  1 GU:   Kidney Stone - N20.0   2   Gross hematuria - R31.0   3   Urethral Stricture, Unspec - N35.9    PLAN:           Orders Labs Urine Culture and Sensitivity          Schedule Return Visit: Other See Visit Notes             Note: Will call to schedule surgery   Return Visit: Keep Scheduled Appointment          Document Letter(s):  Created for Patient: Clinical Summary         Notes:   1. Gross hematuria: He does need to complete his evaluation of the lower urinary tract. I have recommended that we proceed with an operative procedure that would include balloon dilation of his urethral stricture with cystoscopy to rule out a lower urinary tract source for his hematuria. We did discuss his hematuria may be related to his urethral stricture disease, BPH, or renal calculus disease considering the fact that he is on anticoagulation. He will stop Xarelto 3 days prior.   2. Complex renal cysts: These are not completely characterized on his CT scan. I have recommended that we proceed with an MRI of the kidneys with and without contrast for further evaluation which may definitively allow characterization of these complex renal cyst. This will be scheduled.   3. Bilateral renal calculi: Considering the fact that we are going to proceed with an anesthetic and operative procedure to complete his cystoscopy, we did discuss the option of proceeding with ureteroscopy for treatment of his stones. Considering the right side appears to have the larger stone that would be more likely to cause him significant issues in the near future, we have agreed to proceed with right retrograde pyelography, right ureteroscopy with laser lithotripsy and right ureteral stent placement. We have reviewed the potential risks, complications, and expected recovery process associated with this procedure. He does wish to be proactive about his stone disease and gives informed consent.   4. Recurrent urolithiasis: He will complete a 24-hour urine prior to the scheduled appointment in November. He continues taking hydrochlorothiazide 12.5 mg daily.   CC: [name]    E & M CODE: I spent at least 25 minutes face to face with the patient, more than 50% of that time was spent on counseling  and/or coordinating care.     * Signed by [name], M.D. on 01/15/16 at 6:40 PM (EDT)*

## 2016-01-31 ENCOUNTER — Ambulatory Visit (HOSPITAL_COMMUNITY): Payer: Medicare Other | Admitting: Anesthesiology

## 2016-01-31 ENCOUNTER — Encounter (HOSPITAL_COMMUNITY): Payer: Self-pay

## 2016-01-31 ENCOUNTER — Encounter (HOSPITAL_COMMUNITY)
Admission: RE | Disposition: A | Discharge status: Home / Self Care | Payer: Self-pay | Source: Ambulatory Visit | Attending: Urology

## 2016-01-31 ENCOUNTER — Ambulatory Visit (HOSPITAL_COMMUNITY)
Admission: RE | Admit: 2016-01-31 | Discharge: 2016-01-31 | Disposition: A | Discharge status: Home / Self Care | Payer: Medicare Other | Source: Ambulatory Visit | Attending: Urology | Admitting: Urology

## 2016-01-31 ENCOUNTER — Other Ambulatory Visit (HOSPITAL_COMMUNITY): Payer: Self-pay | Admitting: Urology

## 2016-01-31 DIAGNOSIS — D4102 Neoplasm of uncertain behavior of left kidney: Secondary | ICD-10-CM

## 2016-01-31 DIAGNOSIS — N358 Other urethral stricture: Secondary | ICD-10-CM | POA: Diagnosis not present

## 2016-01-31 DIAGNOSIS — N323 Diverticulum of bladder: Secondary | ICD-10-CM | POA: Insufficient documentation

## 2016-01-31 DIAGNOSIS — I10 Essential (primary) hypertension: Secondary | ICD-10-CM | POA: Diagnosis not present

## 2016-01-31 DIAGNOSIS — G4730 Sleep apnea, unspecified: Secondary | ICD-10-CM | POA: Insufficient documentation

## 2016-01-31 DIAGNOSIS — M1990 Unspecified osteoarthritis, unspecified site: Secondary | ICD-10-CM | POA: Diagnosis not present

## 2016-01-31 DIAGNOSIS — N281 Cyst of kidney, acquired: Secondary | ICD-10-CM | POA: Diagnosis not present

## 2016-01-31 DIAGNOSIS — Z8673 Personal history of transient ischemic attack (TIA), and cerebral infarction without residual deficits: Secondary | ICD-10-CM | POA: Diagnosis not present

## 2016-01-31 DIAGNOSIS — E119 Type 2 diabetes mellitus without complications: Secondary | ICD-10-CM | POA: Diagnosis not present

## 2016-01-31 DIAGNOSIS — N359 Urethral stricture, unspecified: Secondary | ICD-10-CM | POA: Diagnosis not present

## 2016-01-31 DIAGNOSIS — Z7901 Long term (current) use of anticoagulants: Secondary | ICD-10-CM | POA: Insufficient documentation

## 2016-01-31 DIAGNOSIS — Z96659 Presence of unspecified artificial knee joint: Secondary | ICD-10-CM | POA: Diagnosis not present

## 2016-01-31 DIAGNOSIS — D49511 Neoplasm of unspecified behavior of right kidney: Secondary | ICD-10-CM

## 2016-01-31 DIAGNOSIS — R339 Retention of urine, unspecified: Secondary | ICD-10-CM | POA: Insufficient documentation

## 2016-01-31 DIAGNOSIS — N400 Enlarged prostate without lower urinary tract symptoms: Secondary | ICD-10-CM | POA: Insufficient documentation

## 2016-01-31 DIAGNOSIS — R319 Hematuria, unspecified: Secondary | ICD-10-CM | POA: Diagnosis not present

## 2016-01-31 DIAGNOSIS — F329 Major depressive disorder, single episode, unspecified: Secondary | ICD-10-CM | POA: Insufficient documentation

## 2016-01-31 DIAGNOSIS — Z7984 Long term (current) use of oral hypoglycemic drugs: Secondary | ICD-10-CM | POA: Diagnosis not present

## 2016-01-31 DIAGNOSIS — D4101 Neoplasm of uncertain behavior of right kidney: Secondary | ICD-10-CM

## 2016-01-31 DIAGNOSIS — N200 Calculus of kidney: Secondary | ICD-10-CM | POA: Diagnosis not present

## 2016-01-31 DIAGNOSIS — D49512 Neoplasm of unspecified behavior of left kidney: Secondary | ICD-10-CM

## 2016-01-31 HISTORY — PX: CYSTOSCOPY WITH URETHRAL DILATATION: SHX5125

## 2016-01-31 HISTORY — PX: CYSTOSCOPY WITH RETROGRADE PYELOGRAM, URETEROSCOPY AND STENT PLACEMENT: SHX5789

## 2016-01-31 HISTORY — PX: HOLMIUM LASER APPLICATION: SHX5852

## 2016-01-31 LAB — GLUCOSE, CAPILLARY
Glucose-Capillary: 139 mg/dL — ABNORMAL HIGH (ref 65–99)
Glucose-Capillary: 172 mg/dL — ABNORMAL HIGH (ref 65–99)

## 2016-01-31 LAB — PROTIME-INR
INR: 0.99
PROTHROMBIN TIME: 13.1 s (ref 11.4–15.2)

## 2016-01-31 SURGERY — CYSTOURETEROSCOPY, WITH RETROGRADE PYELOGRAM AND STENT INSERTION
Anesthesia: General | Laterality: Right

## 2016-01-31 MED ORDER — LACTATED RINGERS IV SOLN
INTRAVENOUS | Status: DC
  Administered 2016-01-31 (×2): via INTRAVENOUS

## 2016-01-31 MED ORDER — FENTANYL CITRATE (PF) 100 MCG/2ML IJ SOLN
INTRAMUSCULAR | Status: DC | PRN
  Administered 2016-01-31 (×2): 50 ug via INTRAVENOUS

## 2016-01-31 MED ORDER — MIDAZOLAM HCL 2 MG/2ML IJ SOLN
INTRAMUSCULAR | Status: AC
  Filled 2016-01-31: qty 2

## 2016-01-31 MED ORDER — CIPROFLOXACIN IN D5W 400 MG/200ML IV SOLN
INTRAVENOUS | Status: AC
  Filled 2016-01-31: qty 200

## 2016-01-31 MED ORDER — IOHEXOL 300 MG/ML  SOLN
INTRAMUSCULAR | Status: DC | PRN
  Administered 2016-01-31: 18 mL via URETHRAL

## 2016-01-31 MED ORDER — PROPOFOL 10 MG/ML IV BOLUS
INTRAVENOUS | Status: AC
  Filled 2016-01-31: qty 20

## 2016-01-31 MED ORDER — MEPERIDINE HCL 50 MG/ML IJ SOLN: 6.2500 mg | INTRAMUSCULAR | Status: DC | PRN

## 2016-01-31 MED ORDER — SUCCINYLCHOLINE CHLORIDE 20 MG/ML IJ SOLN
INTRAMUSCULAR | Status: DC | PRN
  Administered 2016-01-31: 100 mg via INTRAVENOUS

## 2016-01-31 MED ORDER — MIDAZOLAM HCL 5 MG/5ML IJ SOLN
INTRAMUSCULAR | Status: DC | PRN
  Administered 2016-01-31: 2 mg via INTRAVENOUS

## 2016-01-31 MED ORDER — SODIUM CHLORIDE 0.9 % IR SOLN
Status: DC | PRN
  Administered 2016-01-31: 1500 mL

## 2016-01-31 MED ORDER — DEXAMETHASONE SODIUM PHOSPHATE 10 MG/ML IJ SOLN
INTRAMUSCULAR | Status: DC | PRN
  Administered 2016-01-31: 10 mg via INTRAVENOUS

## 2016-01-31 MED ORDER — LIDOCAINE 2% (20 MG/ML) 5 ML SYRINGE
INTRAMUSCULAR | Status: AC
  Filled 2016-01-31: qty 5

## 2016-01-31 MED ORDER — PROPOFOL 10 MG/ML IV BOLUS
INTRAVENOUS | Status: DC | PRN
  Administered 2016-01-31 (×2): 200 mg via INTRAVENOUS
  Administered 2016-01-31 (×2): 100 mg via INTRAVENOUS

## 2016-01-31 MED ORDER — FENTANYL CITRATE (PF) 100 MCG/2ML IJ SOLN
INTRAMUSCULAR | Status: AC
  Filled 2016-01-31: qty 2

## 2016-01-31 MED ORDER — CIPROFLOXACIN IN D5W 400 MG/200ML IV SOLN
400.0000 mg | INTRAVENOUS | Status: AC
  Administered 2016-01-31: 400 mg via INTRAVENOUS

## 2016-01-31 MED ORDER — HYDROCODONE-ACETAMINOPHEN 5-325 MG PO TABS
1.0000 | ORAL_TABLET | Freq: Four times a day (QID) | ORAL | 0 refills | Status: DC | PRN
Start: 2016-01-31 — End: 2016-04-14

## 2016-01-31 MED ORDER — PROMETHAZINE HCL 25 MG/ML IJ SOLN: 6.2500 mg | INTRAMUSCULAR | Status: DC | PRN

## 2016-01-31 MED ORDER — ONDANSETRON HCL 4 MG/2ML IJ SOLN
INTRAMUSCULAR | Status: DC | PRN
  Administered 2016-01-31: 4 mg via INTRAVENOUS

## 2016-01-31 MED ORDER — LIDOCAINE 2% (20 MG/ML) 5 ML SYRINGE
INTRAMUSCULAR | Status: DC | PRN
Start: 2016-01-31 — End: 2016-01-31
  Administered 2016-01-31 (×2): 100 mg via INTRAVENOUS

## 2016-01-31 MED ORDER — ALBUTEROL SULFATE HFA 108 (90 BASE) MCG/ACT IN AERS
INHALATION_SPRAY | RESPIRATORY_TRACT | Status: DC | PRN
  Administered 2016-01-31: 2 via RESPIRATORY_TRACT

## 2016-01-31 MED ORDER — LACTATED RINGERS IV SOLN: INTRAVENOUS | Status: DC

## 2016-01-31 MED ORDER — ALBUTEROL SULFATE HFA 108 (90 BASE) MCG/ACT IN AERS
INHALATION_SPRAY | RESPIRATORY_TRACT | Status: AC
  Filled 2016-01-31: qty 6.7

## 2016-01-31 MED ORDER — FENTANYL CITRATE (PF) 100 MCG/2ML IJ SOLN: 25.0000 ug | INTRAMUSCULAR | Status: DC | PRN

## 2016-01-31 SURGICAL SUPPLY — 32 items
BAG URINE DRAINAGE (UROLOGICAL SUPPLIES) ×4 IMPLANT
BAG URO CATCHER STRL LF (MISCELLANEOUS) ×4 IMPLANT
BALLN NEPHROSTOMY (BALLOONS) ×4
BALLOON NEPHROSTOMY (BALLOONS) IMPLANT
BASKET ZERO TIP NITINOL 2.4FR (BASKET) ×2 IMPLANT
BSKT STON RTRVL ZERO TP 2.4FR (BASKET) ×2
CATH FOLEY 2W COUNCIL 20FR 5CC (CATHETERS) IMPLANT
CATH INTERMIT  6FR 70CM (CATHETERS) ×2 IMPLANT
CATH ROBINSON RED A/P 14FR (CATHETERS) ×4 IMPLANT
CATH URET 5FR 28IN CONE TIP (BALLOONS)
CATH URET 5FR 70CM CONE TIP (BALLOONS) IMPLANT
CLOTH BEACON ORANGE TIMEOUT ST (SAFETY) ×4 IMPLANT
FIBER LASER FLEXIVA 365 (UROLOGICAL SUPPLIES) IMPLANT
FIBER LASER TRAC TIP (UROLOGICAL SUPPLIES) ×2 IMPLANT
GLOVE BIO SURGEON STRL SZ7.5 (GLOVE) ×4 IMPLANT
GLOVE BIOGEL M STRL SZ7.5 (GLOVE) ×4 IMPLANT
GOWN STRL REUS W/TWL LRG LVL3 (GOWN DISPOSABLE) ×8 IMPLANT
GOWN STRL REUS W/TWL XL LVL3 (GOWN DISPOSABLE) ×4 IMPLANT
GUIDEWIRE .038 (WIRE) IMPLANT
GUIDEWIRE ANG ZIPWIRE 038X150 (WIRE) IMPLANT
GUIDEWIRE STR DUAL SENSOR (WIRE) ×4 IMPLANT
IV NS 1000ML (IV SOLUTION) ×4
IV NS 1000ML BAXH (IV SOLUTION) ×2 IMPLANT
MANIFOLD NEPTUNE II (INSTRUMENTS) ×4 IMPLANT
NS IRRIG 1000ML POUR BTL (IV SOLUTION) ×2 IMPLANT
PACK CYSTO (CUSTOM PROCEDURE TRAY) ×4 IMPLANT
SHEATH ACCESS URETERAL 38CM (SHEATH) IMPLANT
STENT URET 6FRX24 CONTOUR (STENTS) ×2 IMPLANT
TUBING CONNECTING 10 (TUBING) ×3 IMPLANT
TUBING CONNECTING 10' (TUBING) ×1
TUBING INSUFFLATION 10FT LAP (TUBING) IMPLANT
WATER STERILE IRR 3000ML UROMA (IV SOLUTION) ×4 IMPLANT

## 2016-01-31 NOTE — Interval H&P Note (Signed)
History and Physical Interval Note:  01/31/2016 8:37 AM  Eddie Zimmerman  has presented today for surgery, with the diagnosis of RIGHT RENAL CALCULI, HEMATURIA, URETHRAL STRICTURE  The various methods of treatment have been discussed with the patient and family. After consideration of risks, benefits and other options for treatment, the patient has consented to  Procedure(s): CYSTOSCOPY WITH RETROGRADE PYELOGRAM, URETEROSCOPY AND STENT PLACEMENT (Right) CYSTOSCOPY WITH URETHRAL BALLOON DILATATION (N/A) HOLMIUM LASER APPLICATION (Right) as a surgical intervention .  The patient's history has been reviewed, patient examined, no change in status, stable for surgery.  I have reviewed the patient's chart and labs.  Questions were answered to the patient's satisfaction.     Chaitanya Amedee,LES

## 2016-01-31 NOTE — Anesthesia Postprocedure Evaluation (Signed)
Anesthesia Post Note  Patient: Eddie Zimmerman  Procedure(s) Performed: Procedure(s) (LRB): CYSTOSCOPY WITH RETROGRADE PYELOGRAM, URETEROSCOPY AND STENT PLACEMENT (Right) CYSTOSCOPY WITH URETHRAL BALLOON DILATATION (N/A) HOLMIUM LASER APPLICATION (Right)  Patient location during evaluation: PACU Anesthesia Type: General Level of consciousness: awake and alert Pain management: pain level controlled Vital Signs Assessment: post-procedure vital signs reviewed and stable Respiratory status: spontaneous breathing, nonlabored ventilation, respiratory function stable and patient connected to nasal cannula oxygen Cardiovascular status: blood pressure returned to baseline and stable Postop Assessment: no signs of nausea or vomiting Anesthetic complications: no    Last Vitals:  Vitals:   01/31/16 1154 01/31/16 1212  BP: (!) 143/75 (!) 144/80  Pulse: 63 63  Resp: 16 18  Temp: 36.9 C 36.8 C    Last Pain:  Vitals:   01/31/16 1212  TempSrc: Oral  PainSc:                  [name]

## 2016-01-31 NOTE — Transfer of Care (Signed)
Immediate Anesthesia Transfer of Care Note  Patient: Eddie Zimmerman  Procedure(s) Performed: Procedure(s): CYSTOSCOPY WITH RETROGRADE PYELOGRAM, URETEROSCOPY AND STENT PLACEMENT (Right) CYSTOSCOPY WITH URETHRAL BALLOON DILATATION (N/A) HOLMIUM LASER APPLICATION (Right)  Patient Location: PACU  Anesthesia Type:General  Level of Consciousness: sedated  Airway & Oxygen Therapy: Patient Spontanous Breathing and Patient connected to face mask oxygen  Post-op Assessment: Report given to RN and Post -op Vital signs reviewed and stable  Post vital signs: Reviewed and stable  Last Vitals:  Vitals:   01/31/16 0607  BP: (!) 152/75  Pulse: (!) 57  Resp: 20  Temp: 36.4 C    Last Pain:  Vitals:   01/31/16 0607  TempSrc: Oral      Patients Stated Pain Goal: 4 ([id number])  Complications: No apparent anesthesia complications

## 2016-01-31 NOTE — Discharge Instructions (Addendum)
1. You may see some blood in the urine and may have some burning with urination for 48-72 hours. You also may notice that you have to urinate more frequently or urgently after your procedure which is normal.  2. You should call should you develop an inability urinate, fever > 101, persistent nausea and vomiting that prevents you from eating or drinking to stay hydrated.  3. If you have a stent, you will likely urinate more frequently and urgently until the stent is removed and you may experience some discomfort/pain in the lower abdomen and flank especially when urinating. You may take pain medication prescribed to you if needed for pain. You may also intermittently have blood in the urine until the stent is removed.  YOU MAY REMOVE YOUR STENT ON Tuesday.  SIMPLY PULL ON THE STRING THAT IS TAPED TO YOUR BODY AND THE STENT SHOULD EASILY COME OUT.  YOU MAY TAKE YOUR PAIN MEDICATION IF YOU DEVELOP PAIN ALTHOUGH THIS WILL TYPICALLY RESOLVE WITHIN A COUPLE HOURS.  YOU MAY RESTART XARELTO AFTER YOUR STENT IS REMOVED.

## 2016-01-31 NOTE — Op Note (Signed)
Preoperative diagnosis: Gross hematuria, urethral stricture, right renal calculus  Postoperative diagnosis: Gross hematuria, urethral stricture, right renal calculus  Procedures: 1.  Cystoscopy 2.  Balloon dilation of urethral stricture 3.  Right retrograde pyelography interpretation 4.  Right ureteroscopy with laser lithotripsy and stone removal 5.  Right ureteral stent placement (6 x 24 with string) 6.  Retrograde urethrogram with interpretation  Surgeon: Pryor Curia., M.D.  Anesthesia: General  Complications: None  EBL: Minimal  Intraoperative findings: Right retrograde pyelography was performed with a 6 French ureteral catheter and Omnipaque contrast.  This demonstrated a normal caliber ureter without filling defects.  There was no hydronephrosis or clear filling defects within the renal collecting system.  A retrograde urethrogram was also performed with Omnipaque contrast.  This demonstrated a short urethral stricture in the bulbar urethra without proximal stricture disease.  Indication: Eddie Zimmerman is a 70 year old gentleman with a history of gross hematuria.  He does have a history of urethral stricture disease and recently attempted to perform cystoscopy in the office was unsuccessful due to his urethral stricture.  This has not been clinically significant and has not been causing him any voiding symptoms or obstruction of his bladder.  His gross hematuria has been painless.  He did undergo a CT scan that demonstrated bilateral renal calculi with the largest stone in his right kidney measuring approximately 7 mm.  No other source for hematuria was identified.  He presents today to undergo the above procedures.  The potential risks, complications, and expected recovery process were discussed in detail.  Informed consent has been obtained.  Description of procedure  Patient was taken to the operating room and a general anesthetic was administered.  He was given preoperative  antibiotics, placed in the dorsolithotomy position, and prepped and draped in the usual sterile fashion.  Preoperative timeout was performed.  Ureteroscopy demonstrated a urethral stricture at the junction of the pendulous and bulbar urethra.  I performed a retrograde urethrogram with Omnipaque contrast injected through the cystoscope.  Findings are as dictated above.  I then passed a 0.38 sensor guidewire into the bladder under fluoroscopic guidance.  I then used the Ultraxx 24 Fr balloon dilator to dilate the urethral stricture.  This was left inflated at 14 mmHg pressure for 3 minutes.  I then removed the balloon and reinspected the urethra.  The 39 French cystoscope was able to easily passed into the bladder.  I then carefully examined the bladder with the 30 and 70 lens.  The patient did have a diverticulum in the midline of the trigone between the ureteral orifices likely a result of his prior urologic surgery.  I could not immediately examine this with the rigid cystoscopes although this was eventually examined later in the procedure with the flexible ureteroscope.  There is no evidence of any bladder tumors, stones, or other mucosal pathology to provide a source for his hematuria.     I then examine his right ureteral orifice.  This was cannulated with a 6 French ureteral catheter.  Omnipaque contrast was injected and demonstrated no filling defects or abnormalities.  I inserted a 0.38 sensor guidewire up into the right renal pelvis under fluoroscopic guidance.  A 12/14 ureteral access sheath was then advanced over the wire into the proximal ureter.  The digital flexible ureteroscope was then advanced through the access sheath and into the renal collecting system with the patient 7 mm calculus was identified.  This was grasped with a 0 tip nitinol basket  and brought into the proximal ureter.  Through a separate channel and the digital flexible ureteroscope, then used a 200  holmium laser fiber to  perform laser lithotripsy at settings of 0.6 J and 6 Hz.  Once the stone was adequately fragmented, all fragments were removed with the nitinol basket.  Reinspection of the renal collecting system and ureter revealed no further fragments.  The wire was left in place as the access sheath was removed.  The wire was then backloaded on the cystoscope and a 6 x 24 double-J ureteral stent was advanced over the wire using Seldinger technique.  It was positioned appropriately under fluoroscopic and cystoscopic guidance.  The wire was removed with a good curl noted in the renal pelvis as well as within the bladder.  The patient's bladder was emptied.  He tolerated the procedure well without complications.  He was able to be extubated and transferred to the recovery area in satisfactory condition.

## 2016-01-31 NOTE — Anesthesia Procedure Notes (Signed)
Procedure Name: Intubation Date/Time: 01/31/2016 9:07 AM Performed by: Lind Covert Pre-anesthesia Checklist: Patient identified, Emergency Drugs available, Suction available, Patient being monitored and Timeout performed Patient Re-evaluated:Patient Re-evaluated prior to inductionOxygen Delivery Method: Circle system utilized Preoxygenation: Pre-oxygenation with 100% oxygen Intubation Type: IV induction Ventilation: Mask ventilation without difficulty Laryngoscope Size: Mac, 4 and Glidescope Grade View: Grade I Tube type: Oral Tube size: 7.5 mm Number of attempts: 1 (Attempted LMA 5, unable to seal or get CO2, removed replaced with proseal LMA, developed large leak after 5 minutes, removed used MAC 4 glidescope to intubate, +BBS + CO2) Airway Equipment and Method: Stylet Secured at: 22 cm Tube secured with: Tape Dental Injury: Teeth and Oropharynx as per pre-operative assessment

## 2016-01-31 NOTE — Anesthesia Preprocedure Evaluation (Addendum)
Anesthesia Evaluation  Patient identified by MRN, date of birth, ID band Patient awake    Reviewed: Allergy & Precautions, NPO status , Patient's Chart, lab work & pertinent test results  Airway Mallampati: II  TM Distance: >3 FB Neck ROM: Full    Dental  (+) Teeth Intact, Dental Advisory Given   Pulmonary sleep apnea ,    breath sounds clear to auscultation       Cardiovascular hypertension, Pt. on medications  Rhythm:Regular Rate:Normal     Neuro/Psych PSYCHIATRIC DISORDERS Depression CVA    GI/Hepatic negative GI ROS, Neg liver ROS,   Endo/Other  diabetes, Type 2, Oral Hypoglycemic Agents  Renal/GU negative Renal ROS  negative genitourinary   Musculoskeletal  (+) Arthritis , Osteoarthritis,    Abdominal   Peds negative pediatric ROS (+)  Hematology negative hematology ROS (+)   Anesthesia Other Findings   Reproductive/Obstetrics negative OB ROS                            Lab Results  Component Value Date   WBC 5.1 01/22/2016   HGB 14.6 01/22/2016   HCT 44.2 01/22/2016   MCV 92.1 01/22/2016   PLT 205 01/22/2016   Lab Results  Component Value Date   CREATININE 1.05 01/22/2016   BUN 16 01/22/2016   NA 142 01/22/2016   K 5.1 01/22/2016   CL 106 01/22/2016   CO2 27 01/22/2016   Lab Results  Component Value Date   INR 0.99 01/31/2016   INR 0.96 05/29/2014   05/2015 EKG: sinus bradycardia.  05/2014 Echo - Left ventricle: The cavity size was normal. Wall thickness was increased in a pattern of moderate LVH. Systolic function was normal. The estimated ejection fraction was in the range of 55% to 60%. - Left atrium: The atrium was mildly dilated. - Atrial septum: No defect or patent foramen ovale was identified. - Pulmonary arteries: PA peak pressure: 33 mm Hg (S).  11/2015 Implantable Device Carelink summary report received. Battery status OK. Normal device function. No  new symptom episodes, tachy episodes, brady, or pause episodes. No new AF episodes.   Anesthesia Physical Anesthesia Plan  ASA: III  Anesthesia Plan: General   Post-op Pain Management:    Induction: Intravenous  Airway Management Planned: LMA  Additional Equipment:   Intra-op Plan:   Post-operative Plan: Extubation in OR  Informed Consent: I have reviewed the patients History and Physical, chart, labs and discussed the procedure including the risks, benefits and alternatives for the proposed anesthesia with the patient or authorized representative who has indicated his/her understanding and acceptance.   Dental advisory given  Plan Discussed with: CRNA  Anesthesia Plan Comments:         Anesthesia Quick Evaluation

## 2016-02-01 ENCOUNTER — Encounter (HOSPITAL_COMMUNITY): Payer: Self-pay

## 2016-02-01 ENCOUNTER — Encounter (HOSPITAL_COMMUNITY): Payer: Self-pay | Admitting: Urology

## 2016-02-02 LAB — CUP PACEART REMOTE DEVICE CHECK: MDC IDC SESS DTM: 20170907180840

## 2016-02-02 NOTE — Progress Notes (Signed)
Carelink summary report received. Battery status OK. Normal device function. No new symptom episodes, tachy episodes, brady, or pause episodes. No new AF episodes. Monthly summary reports and ROV/PRN 

## 2016-02-04 ENCOUNTER — Ambulatory Visit (INDEPENDENT_AMBULATORY_CARE_PROVIDER_SITE_OTHER): Payer: Medicare Other | Admitting: *Deleted

## 2016-02-04 DIAGNOSIS — I639 Cerebral infarction, unspecified: Secondary | ICD-10-CM | POA: Diagnosis not present

## 2016-02-04 NOTE — Progress Notes (Signed)
Carelink Summary Report / Loop Recorder 

## 2016-02-05 DIAGNOSIS — N200 Calculus of kidney: Secondary | ICD-10-CM | POA: Diagnosis not present

## 2016-02-05 DIAGNOSIS — Z79891 Long term (current) use of opiate analgesic: Secondary | ICD-10-CM | POA: Diagnosis not present

## 2016-02-05 DIAGNOSIS — Z79899 Other long term (current) drug therapy: Secondary | ICD-10-CM | POA: Diagnosis not present

## 2016-02-05 DIAGNOSIS — I119 Hypertensive heart disease without heart failure: Secondary | ICD-10-CM | POA: Diagnosis not present

## 2016-02-05 DIAGNOSIS — R109 Unspecified abdominal pain: Secondary | ICD-10-CM | POA: Diagnosis not present

## 2016-02-28 ENCOUNTER — Ambulatory Visit (HOSPITAL_COMMUNITY)
Admission: RE | Admit: 2016-02-28 | Discharge: 2016-02-28 | Disposition: A | Discharge status: Home / Self Care | Payer: Medicare Other | Source: Ambulatory Visit | Attending: Urology | Admitting: Urology

## 2016-02-28 DIAGNOSIS — K862 Cyst of pancreas: Secondary | ICD-10-CM | POA: Diagnosis not present

## 2016-02-28 DIAGNOSIS — N281 Cyst of kidney, acquired: Secondary | ICD-10-CM | POA: Diagnosis not present

## 2016-02-28 DIAGNOSIS — D4101 Neoplasm of uncertain behavior of right kidney: Secondary | ICD-10-CM

## 2016-02-28 DIAGNOSIS — D49512 Neoplasm of unspecified behavior of left kidney: Secondary | ICD-10-CM

## 2016-02-28 DIAGNOSIS — K760 Fatty (change of) liver, not elsewhere classified: Secondary | ICD-10-CM | POA: Diagnosis not present

## 2016-02-28 DIAGNOSIS — D4102 Neoplasm of uncertain behavior of left kidney: Secondary | ICD-10-CM

## 2016-02-28 DIAGNOSIS — D49511 Neoplasm of unspecified behavior of right kidney: Secondary | ICD-10-CM | POA: Insufficient documentation

## 2016-02-28 DIAGNOSIS — R590 Localized enlarged lymph nodes: Secondary | ICD-10-CM | POA: Diagnosis not present

## 2016-02-28 MED ORDER — GADOBENATE DIMEGLUMINE 529 MG/ML IV SOLN
20.0000 mL | Freq: Once | INTRAVENOUS | Status: AC | PRN
  Administered 2016-02-28: 20 mL via INTRAVENOUS

## 2016-03-03 ENCOUNTER — Ambulatory Visit (INDEPENDENT_AMBULATORY_CARE_PROVIDER_SITE_OTHER): Payer: Medicare Other | Admitting: *Deleted

## 2016-03-03 DIAGNOSIS — I639 Cerebral infarction, unspecified: Secondary | ICD-10-CM | POA: Diagnosis not present

## 2016-03-04 NOTE — Progress Notes (Signed)
Carelink Summary Report / Loop Recorder 

## 2016-03-05 DIAGNOSIS — D49512 Neoplasm of unspecified behavior of left kidney: Secondary | ICD-10-CM | POA: Diagnosis not present

## 2016-03-05 DIAGNOSIS — N202 Calculus of kidney with calculus of ureter: Secondary | ICD-10-CM | POA: Diagnosis not present

## 2016-03-05 DIAGNOSIS — R310 Gross hematuria: Secondary | ICD-10-CM | POA: Diagnosis not present

## 2016-03-09 LAB — CUP PACEART REMOTE DEVICE CHECK
Date Time Interrogation Session: 20171007181415
Implantable Pulse Generator Implant Date: 20160316

## 2016-03-09 NOTE — Progress Notes (Signed)
Carelink summary report received. Battery status OK. Normal device function. No new symptom episodes, tachy episodes, brady, or pause episodes. No new AF episodes. Monthly summary reports and ROV/PRN 

## 2016-03-16 NOTE — Progress Notes (Signed)
Patient ID: Eddie Zimmerman, male   DOB: 1945-06-06, 70 y.o.   MRN: [id number]     Cardiology Office Note   Date:  03/17/2016   ID:  Eddie Zimmerman, DOB 1946-01-07, MRN [id number]  PCP:  Kandice Hams, MD    Chief Complaint  Patient presents with  . Follow-up    yearly  PAF   Wt Readings from Last 3 Encounters:  03/17/16 121.2 kg (267 lb 1.9 oz)  01/31/16 118.6 kg (261 lb 9 oz)  01/22/16 118.7 kg (261 lb 9.6 oz)       History of Present Illness: Eddie Zimmerman is a 70 y.o. male with a h/o hyperlipidemia.  He had a cryptogenic stroke with ILR placed 3/16. Brief episode of asymptomatic Afib was noted in 8/16 and pt was referred the Afib clinic to discuss anticoagulation. He was started on xarelto. It is affordable.  He has done well since then.  He walks 3 miles every other day.  He has his LINQ monitor in place still.  He has the home monitor that transmits information during the day.    A1C has been under 7.    No CP, SHOB, palpitations.  He goes to the gym 3x/week and uses the elliptical.  He does some weights as well. 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  No NTG use.  He had hematuria, and was diagnosed with kidney stones and renal cyst.  The hematuria is better.    BP increases by evening to the [id number] systolic.        Past Medical History:  Diagnosis Date  . Arthritis    hands, wrist & Knee   . Depression   . Diabetes mellitus without complication ([place])   . Gout   . History of kidney stones LAST MAY 2015  . Hypertension   . Sleep apnea    cpap use, SETTING OF 13  . Stroke [facility]) 06-07-2014    Past Surgical History:  Procedure Laterality Date  . ARTHROSCOPIC REPAIR PCL Bilateral    right- x1, left- x3   . COLONOSCOPY WITH PROPOFOL N/A 10/24/2014   Procedure: COLONOSCOPY WITH PROPOFOL;  Surgeon: Garlan Fair, MD;  Location: [facility] ENDOSCOPY;  Service: Endoscopy;  Laterality: N/A;  . CYSTOSCOPY WITH RETROGRADE PYELOGRAM, URETEROSCOPY AND STENT  PLACEMENT Right 01/31/2016   Procedure: CYSTOSCOPY WITH RETROGRADE PYELOGRAM, URETEROSCOPY AND STENT PLACEMENT;  Surgeon: [name], MD;  Location: [facility] ORS;  Service: Urology;  Laterality: Right;  . CYSTOSCOPY WITH URETHRAL DILATATION N/A 01/31/2016   Procedure: CYSTOSCOPY WITH URETHRAL BALLOON DILATATION;  Surgeon: [name], MD;  Location: [facility] ORS;  Service: Urology;  Laterality: N/A;  . HERNIA REPAIR     abdominal  . HOLMIUM LASER APPLICATION Right [id number]   Procedure: HOLMIUM LASER APPLICATION;  Surgeon: [name], MD;  Location: [facility] ORS;  Service: Urology;  Laterality: Right;  . JOINT REPLACEMENT Left   . KNEE SURGERY Left   . LAPAROSCOPIC GASTRIC BANDING    . LOOP RECORDER IMPLANT N/A 07/12/2014   Procedure: LOOP RECORDER IMPLANT;  Surgeon: Evans Lance, MD;  Location: [facility] CATH LAB;  Service: Cardiovascular;  Laterality: N/A;  . TARSAL TUNNEL RELEASE Bilateral 2005  . TEE WITHOUT CARDIOVERSION N/A 06/14/2014   Procedure: TRANSESOPHAGEAL ECHOCARDIOGRAM (TEE);  Surgeon: Sueanne Margarita, MD;  Location: [facility] ENDOSCOPY;  Service: Cardiovascular;  Laterality: N/A;  . TONSILLECTOMY    . TOTAL KNEE ARTHROPLASTY Right 06/07/2014   Procedure: TOTAL KNEE ARTHROPLASTY;  Surgeon: Kerin Salen, MD;  Location: [facility];  Service: Orthopedics;  Laterality: Right;     Current Outpatient Prescriptions  Medication Sig Dispense Refill  . allopurinol (ZYLOPRIM) 300 MG tablet Take 300 mg by mouth daily with lunch.     . Aspirin-Acetaminophen-Caffeine (EXCEDRIN PO) Take 1-2 tablets by mouth daily as needed (headache).    . B Complex-C (SUPER B COMPLEX PO) Take 1 tablet by mouth daily with lunch.    . Coenzyme Q10 300 MG CAPS Take 300 mg by mouth daily with lunch.    . CRESTOR 5 MG tablet TAKE 1 TABLET DAILY (Patient taking differently: TAKE 1 TABLET DAILY WITH LUNCH) 45 tablet 9  . escitalopram (LEXAPRO) 10 MG tablet Take 10 mg by mouth daily with lunch.     . hydrochlorothiazide (MICROZIDE) 12.5 MG  capsule Take 12.5 mg by mouth daily with lunch.    . lisinopril (PRINIVIL,ZESTRIL) 20 MG tablet Take 20 mg by mouth daily with lunch.     . Multiple Vitamin (MULTIVITAMIN WITH MINERALS) TABS tablet Take 1 tablet by mouth daily with lunch. Men's One a Day    . pioglitazone (ACTOS) 15 MG tablet Take 15 mg by mouth daily with lunch.     . Polyvinyl Alcohol-Povidone (CLEAR EYES NATURAL TEARS OP) Place 1 drop into both eyes 2 (two) times daily as needed (dry eyes).    . pregabalin (LYRICA) 75 MG capsule Take 75 mg by mouth daily with lunch.    [name] PRESCRIPTION MEDICATION Inhale into the lungs at bedtime. CPAP    . vitamin B-12 (CYANOCOBALAMIN) 1000 MCG tablet Take 1,000 mcg by mouth daily with lunch.    Alveda Reasons 20 MG TABS tablet TAKE 1 TABLET DAILY WITH SUPPER 90 tablet 2  . amoxicillin (AMOXIL) 500 MG capsule Take 2,000 mg by mouth See admin instructions. Take 4 capsules (2000 mg) by mouth one hour prior to dental procedure - last procedure 01/15/16    . HYDROcodone-acetaminophen (NORCO/VICODIN) 5-325 MG tablet Take 1-2 tablets by mouth every 6 (six) hours as needed. (Patient not taking: Reported on 03/17/2016) 25 tablet 0   No current facility-administered medications for this visit.     Allergies:   Patient has no known allergies.    Social History:  The patient  reports that he has never smoked. He has never used smokeless tobacco. He reports that he drinks alcohol. He reports that he does not use drugs.   Family History:  The patient's family history includes AAA (abdominal aortic aneurysm) in his brother and mother; Alzheimer's disease in his father; Cirrhosis in his brother; Hypertension in his brother and mother.    ROS:  Please see the history of present illness.   Otherwise, review of systems are positive for weight gain. No residual issues after the stroke.   No CP.  All other systems are reviewed and negative.    PHYSICAL EXAM: VS:  BP 124/72 (BP Location: Right Arm, Patient  Position: Sitting, Cuff Size: Large)   Pulse 62   Ht 5\' 10"  (1.778 m)   Wt 121.2 kg (267 lb 1.9 oz)   SpO2 95%   BMI 38.33 kg/m  , BMI Body mass index is 38.33 kg/m. GEN: Well nourished, well developed, in no acute distress  HEENT: normal  Neck: no JVD, carotid bruits, or masses Cardiac: RRR; no murmurs, rubs, or gallops,no edema  Respiratory:  clear to auscultation bilaterally, normal work of breathing GI: soft, nontender, nondistended, + BS, can palpate port  from lap band MS: no deformity or atrophy  Skin: warm and dry, no rash Neuro:  Strength and sensation are intact Psych: euthymic mood, full affect   EKG:   The ekg ordered today demonstrates sinus bradycardia, nonspecific ST segment changes- anterior TWI, unchanged from prior   Recent Labs: 06/15/2015: ALT 21 01/22/2016: BUN 16; Creatinine, Ser 1.05; Hemoglobin 14.6; Platelets 205; Potassium 5.1; Sodium 142   Lipid Panel    Component Value Date/Time   CHOL 135 06/15/2015 0735   TRIG 136 06/15/2015 0735   HDL 34 (L) 06/15/2015 0735   CHOLHDL 4.0 06/15/2015 0735   VLDL 27 06/15/2015 0735   LDLCALC 74 06/15/2015 0735     Other studies Reviewed: Additional studies/ records that were reviewed today with results demonstrating: Hospital records from the time of his stroke reviewed.   ASSESSMENT AND PLAN:  1. PAF: Maintianing NSR.  Xarelto for anticoagulation for stroke prevention.  No problems doing the elliptical. 2. Hypertensive heart disease: Fairly well controlled in the morning, but increased in the evening. Increase lisinopril to 20 mg BID.  Write Rx for 40 mg tabs.. Continue regular exercise.  3. Hyperlipidemia:  Well-controlled in February 2017.  4. DM: Well-controlled. Managed by primary care physician.  5. Obesity: He had lap band surgery in 2015.  He has had some .  Weight has plateaued in this range.  Continue to try to exercise to help with further weight loss. 6. Negative stress in 2010.  No sx with  exercise.  ECG abnormal but unchanged.  Hold of on ischemic testing.   Current medicines are reviewed at length with the patient today.  The patient concerns regarding his medicines were addressed.  The following changes have been made:  Increase lisinopril  Labs/ tests ordered today include:  No orders of the defined types were placed in this encounter.   Recommend 150 minutes/week of aerobic exercise Low fat, low carb, high fiber diet recommended  Disposition:   FU in 6 months   Signed, Larae Grooms, MD  03/17/2016 10:02 AM    [facility] [address], [place], [place]  [address] Phone: [phone]; Fax: [phone]

## 2016-03-17 ENCOUNTER — Ambulatory Visit (INDEPENDENT_AMBULATORY_CARE_PROVIDER_SITE_OTHER): Payer: Medicare Other | Admitting: Interventional Cardiology

## 2016-03-17 ENCOUNTER — Encounter: Payer: Self-pay | Admitting: Interventional Cardiology

## 2016-03-17 VITALS — BP 124/72 | HR 62 | Ht 70.0 in | Wt 267.1 lb

## 2016-03-17 DIAGNOSIS — I119 Hypertensive heart disease without heart failure: Secondary | ICD-10-CM | POA: Diagnosis not present

## 2016-03-17 DIAGNOSIS — E782 Mixed hyperlipidemia: Secondary | ICD-10-CM

## 2016-03-17 DIAGNOSIS — I480 Paroxysmal atrial fibrillation: Secondary | ICD-10-CM

## 2016-03-17 DIAGNOSIS — I639 Cerebral infarction, unspecified: Secondary | ICD-10-CM | POA: Diagnosis not present

## 2016-03-17 HISTORY — DX: Hypertensive heart disease without heart failure: I11.9

## 2016-03-17 MED ORDER — LISINOPRIL 40 MG PO TABS: ORAL_TABLET | ORAL | 6 refills | Status: DC

## 2016-03-17 NOTE — Addendum Note (Signed)
Addended by: Dennie Fetters on: 03/17/2016 04:02 PM   Modules accepted: Orders

## 2016-03-17 NOTE — Patient Instructions (Signed)
**Note De-Identified  Obfuscation** Medication Instructions:  Increase Lisinopril 20 mg to twice a day. All other medications remain the same.  Labwork: CMET and Lipids on 12/6. Please do not eat or drink after midnight the night before.  Testing/Procedures: None  Follow-Up: Your physician wants you to follow-up in: 6 months. You will receive a reminder letter in the mail two months in advance. If you don't receive a letter, please call our office to schedule the follow-up appointment.     If you need a refill on your cardiac medications before your next appointment, please call your pharmacy.

## 2016-04-02 ENCOUNTER — Other Ambulatory Visit (INDEPENDENT_AMBULATORY_CARE_PROVIDER_SITE_OTHER): Payer: Medicare Other

## 2016-04-02 ENCOUNTER — Ambulatory Visit (INDEPENDENT_AMBULATORY_CARE_PROVIDER_SITE_OTHER): Payer: Medicare Other | Admitting: *Deleted

## 2016-04-02 DIAGNOSIS — E782 Mixed hyperlipidemia: Secondary | ICD-10-CM

## 2016-04-02 DIAGNOSIS — N201 Calculus of ureter: Secondary | ICD-10-CM | POA: Diagnosis not present

## 2016-04-02 DIAGNOSIS — I639 Cerebral infarction, unspecified: Secondary | ICD-10-CM | POA: Diagnosis not present

## 2016-04-02 DIAGNOSIS — R310 Gross hematuria: Secondary | ICD-10-CM | POA: Diagnosis not present

## 2016-04-02 LAB — COMPREHENSIVE METABOLIC PANEL
ALK PHOS: 56 U/L (ref 40–115)
ALT: 29 U/L (ref 9–46)
AST: 33 U/L (ref 10–35)
Albumin: 4.7 g/dL (ref 3.6–5.1)
BUN: 16 mg/dL (ref 7–25)
CO2: 22 mmol/L (ref 20–31)
CREATININE: 0.87 mg/dL (ref 0.70–1.25)
Calcium: 9.3 mg/dL (ref 8.6–10.3)
Chloride: 105 mmol/L (ref 98–110)
Glucose, Bld: 147 mg/dL — ABNORMAL HIGH (ref 65–99)
POTASSIUM: 4.4 mmol/L (ref 3.5–5.3)
SODIUM: 140 mmol/L (ref 135–146)
Total Bilirubin: 0.7 mg/dL (ref 0.2–1.2)
Total Protein: 7.2 g/dL (ref 6.1–8.1)

## 2016-04-02 LAB — LIPID PANEL
CHOLESTEROL: 161 mg/dL (ref <200)
HDL: 36 mg/dL — ABNORMAL LOW (ref >40)
LDL Cholesterol: 89 mg/dL (ref <100)
Total CHOL/HDL Ratio: 4.5 Ratio (ref <5.0)
Triglycerides: 179 mg/dL — ABNORMAL HIGH (ref <150)
VLDL: 36 mg/dL — ABNORMAL HIGH (ref <30)

## 2016-04-03 ENCOUNTER — Telehealth: Payer: Self-pay | Admitting: Cardiology

## 2016-04-03 NOTE — Telephone Encounter (Signed)
Spoke w/ pt and requested that he send a manual transmission b/c his home monitor has not updated in at least 14 days.   

## 2016-04-03 NOTE — Progress Notes (Signed)
Carelink Summary Report / Loop Recorder 

## 2016-04-08 ENCOUNTER — Other Ambulatory Visit: Payer: Self-pay | Admitting: Urology

## 2016-04-08 NOTE — Progress Notes (Signed)
Pt is being scheduled for preop appt; please place surgical orders in epic. Thanks.  

## 2016-04-09 ENCOUNTER — Telehealth: Payer: Self-pay | Admitting: Interventional Cardiology

## 2016-04-09 NOTE — Telephone Encounter (Signed)
New message  Pt is having outpatient surgery, kidney stones, on Monday, 12/18  Needs to know when to stop xarelto 20mg  prior sugery  Please call back and advise

## 2016-04-09 NOTE — Telephone Encounter (Signed)
Pt on Xarelto for afib with CHADS2 score of 4 (HTN, DM, and cryptogenic stroke 05/2014 - pt found to have intermittent afib and placed on Xarelto after). Recommend that he only hold for 24 hours prior to procedure and resume as soon as it's safe after given his hx of stroke.   Called Dr [name] with [facility] to request formal clearance letter, they will fax it over today. Pt is aware of Xarelto instructions.

## 2016-04-09 NOTE — Telephone Encounter (Deleted)
New message  Pt is having outpatient surgery, kidney stones, on Monday 12/

## 2016-04-09 NOTE — Telephone Encounter (Signed)
Received clearance request. Faxed back below recommendations to [phone].

## 2016-04-11 ENCOUNTER — Encounter (HOSPITAL_COMMUNITY)
Admission: RE | Admit: 2016-04-11 | Discharge: 2016-04-11 | Disposition: A | Discharge status: Home / Self Care | Payer: Medicare Other | Source: Ambulatory Visit | Attending: Urology | Admitting: Urology

## 2016-04-11 ENCOUNTER — Encounter (HOSPITAL_COMMUNITY): Payer: Self-pay

## 2016-04-11 DIAGNOSIS — Z8673 Personal history of transient ischemic attack (TIA), and cerebral infarction without residual deficits: Secondary | ICD-10-CM | POA: Diagnosis not present

## 2016-04-11 DIAGNOSIS — Z7901 Long term (current) use of anticoagulants: Secondary | ICD-10-CM | POA: Diagnosis not present

## 2016-04-11 DIAGNOSIS — E119 Type 2 diabetes mellitus without complications: Secondary | ICD-10-CM | POA: Diagnosis not present

## 2016-04-11 DIAGNOSIS — R310 Gross hematuria: Secondary | ICD-10-CM | POA: Diagnosis not present

## 2016-04-11 DIAGNOSIS — Z87442 Personal history of urinary calculi: Secondary | ICD-10-CM | POA: Diagnosis not present

## 2016-04-11 DIAGNOSIS — G4730 Sleep apnea, unspecified: Secondary | ICD-10-CM | POA: Diagnosis not present

## 2016-04-11 DIAGNOSIS — I10 Essential (primary) hypertension: Secondary | ICD-10-CM | POA: Diagnosis not present

## 2016-04-11 DIAGNOSIS — I4891 Unspecified atrial fibrillation: Secondary | ICD-10-CM | POA: Diagnosis not present

## 2016-04-11 DIAGNOSIS — Z7984 Long term (current) use of oral hypoglycemic drugs: Secondary | ICD-10-CM | POA: Diagnosis not present

## 2016-04-11 HISTORY — DX: Cardiac arrhythmia, unspecified: I49.9

## 2016-04-11 HISTORY — DX: Unspecified hearing loss, bilateral: H91.93

## 2016-04-11 HISTORY — DX: Bariatric surgery status: Z98.84

## 2016-04-11 LAB — GLUCOSE, CAPILLARY: GLUCOSE-CAPILLARY: 190 mg/dL — AB (ref 65–99)

## 2016-04-11 LAB — CBC
HEMATOCRIT: 45.8 % (ref 39.0–52.0)
HEMOGLOBIN: 15.4 g/dL (ref 13.0–17.0)
MCH: 31.1 pg (ref 26.0–34.0)
MCHC: 33.6 g/dL (ref 30.0–36.0)
MCV: 92.5 fL (ref 78.0–100.0)
PLATELETS: 209 10*3/uL (ref 150–400)
RBC: 4.95 MIL/uL (ref 4.22–5.81)
RDW: 13.8 % (ref 11.5–15.5)
WBC: 6.6 10*3/uL (ref 4.0–10.5)

## 2016-04-11 NOTE — Pre-Procedure Instructions (Signed)
EKG 11'17 Epic, Echo [id number] Epic. Labs 04-02-16 CMP,LP Epic.

## 2016-04-11 NOTE — Patient Instructions (Addendum)
Eddie Zimmerman  04/11/2016   Your procedure is scheduled on: 04-14-16  Report to [facility] Main  Entrance take [facility]  elevators to 3rd floor to  [place] at  2:30 PM.  Call this number if you have problems the morning of surgery [phone] Bring cpap mask/tubing  Remember: ONLY 1 PERSON MAY GO WITH YOU TO SHORT STAY TO GET  READY MORNING OF [name]  Do not eat food or drink liquids :After Midnight.Exception may have Clear Liquids 12 midnight to 1000 AM-,then nothing.   CLEAR LIQUID DIET   Foods Allowed                                                                     Foods Excluded  Coffee and tea, regular and decaf                             liquids that you cannot  Plain Jell-O in any flavor                                             see through such as: Fruit ices (not with fruit pulp)                                     milk, soups, orange juice  Iced Popsicles                                    All solid food Carbonated beverages, regular and diet                                    Cranberry, grape and apple juices Sports drinks like Gatorade Lightly seasoned clear broth or consume(fat free) Sugar, honey syrup   _____________________________________________________________________       Take these medicines the morning of surgery with A SIP OF WATER: Allopurinol. Crestor. Escitalopram. Lyrica. Use Xarelto per MD instructions DO NOT TAKE ANY DIABETIC MEDICATIONS DAY OF YOUR SURGERY                               You may not have any metal on your body including hair pins and              piercings  Do not wear jewelry, make-up, lotions, powders or perfumes, deodorant             Do not wear nail polish.  Do not shave  48 hours prior to surgery.              Men may shave face and neck.   Do not bring valuables to the hospital. [facility] IS NOT  RESPONSIBLE   FOR VALUABLES.  Contacts, dentures or bridgework may  not be worn into surgery.  Leave suitcase in the car. After surgery it may be brought to your room.     Patients discharged the day of surgery will not be allowed to drive home.  Name and phone number of your driver:Eddie Zimmerman  Spouse -[phone] cell  Special Instructions: N/A              Please read over the following fact sheets you were given: _____________________________________________________________________             [facility] - Preparing for Surgery Before surgery, you can play an important role.  Because skin is not sterile, your skin needs to be as free of germs as possible.  You can reduce the number of germs on your skin by washing with CHG (chlorahexidine gluconate) soap before surgery.  CHG is an antiseptic cleaner which kills germs and bonds with the skin to continue killing germs even after washing. Please DO NOT use if you have an allergy to CHG or antibacterial soaps.  If your skin becomes reddened/irritated stop using the CHG and inform your nurse when you arrive at Short Stay. Do not shave (including legs and underarms) for at least 48 hours prior to the first CHG shower.  You may shave your face/neck. Please follow these instructions carefully:  1.  Shower with CHG Soap the night before surgery and the  morning of Surgery.  2.  If you choose to wash your hair, wash your hair first as usual with your  normal  shampoo.  3.  After you shampoo, rinse your hair and body thoroughly to remove the  shampoo.                           4.  Use CHG as you would any other liquid soap.  You can apply chg directly  to the skin and wash                       Gently with a scrungie or clean washcloth.  5.  Apply the CHG Soap to your body ONLY FROM THE NECK DOWN.   Do not use on face/ open                           Wound or open sores. Avoid contact with eyes, ears mouth and genitals (private parts).                       Wash face,  Genitals (private parts) with your normal soap.              6.  Wash thoroughly, paying special attention to the area where your surgery  will be performed.  7.  Thoroughly rinse your body with warm water from the neck down.  8.  DO NOT shower/wash with your normal soap after using and rinsing off  the CHG Soap.                9.  Pat yourself dry with a clean towel.            10.  Wear clean pajamas.            11.  Place clean sheets on your bed the night of your first shower and do not  sleep with pets.  Day of Surgery : Do not apply any lotions/deodorants the morning of surgery.  Please wear clean clothes to the hospital/surgery center.  FAILURE TO FOLLOW THESE INSTRUCTIONS MAY RESULT IN THE CANCELLATION OF YOUR SURGERY    ________________________________________________________________________How to Manage Your Diabetes Before and After Surgery  Why is it important to control my blood sugar before and after surgery? . Improving blood sugar levels before and after surgery helps healing and can limit problems. . A way of improving blood sugar control is eating a healthy diet by: o  Eating less sugar and carbohydrates o  Increasing activity/exercise o  Talking with your doctor about reaching your blood sugar goals . High blood sugars (greater than 180 mg/dL) can raise your risk of infections and slow your recovery, so you will need to focus on controlling your diabetes during the weeks before surgery. . Make sure that the doctor who takes care of your diabetes knows about your planned surgery including the date and location.  How do I manage my blood sugar before surgery? . Check your blood sugar at least 4 times a day, starting 2 days before surgery, to make sure that the level is not too high or low. o Check your blood sugar the morning of your surgery when you wake up and every 2 hours until you get to the Short Stay unit. . If your blood sugar is less than 70 mg/dL, you will need to treat for low blood sugar: o Do not take  insulin. o Treat a low blood sugar (less than 70 mg/dL) with  cup of clear juice (cranberry or apple), 4 glucose tablets, OR glucose gel. o Recheck blood sugar in 15 minutes after treatment (to make sure it is greater than 70 mg/dL). If your blood sugar is not greater than 70 mg/dL on recheck, call [phone] for further instructions. . Report your blood sugar to the short stay nurse when you get to Short Stay.  . If you are admitted to the hospital after surgery: o Your blood sugar will be checked by the staff and you will probably be given insulin after surgery (instead of oral diabetes medicines) to make sure you have good blood sugar levels. o The goal for blood sugar control after surgery is 80-180 mg/dL.   WHAT DO I DO ABOUT MY DIABETES MEDICATION?  [name] Do not take oral diabetes medicines (pills) the morning of surgery.  Patient Signature:  Date:   Nurse Signature:  Date:   Reviewed and Endorsed by [facility] Patient Education Committee, August 2015

## 2016-04-11 NOTE — H&P (Signed)
@media  print    Office Visit Report 04/02/2016    Eddie Zimmerman         MRN: [id number]  PRIMARY CARE:  Eddie Zimmerman  DOB: 1945/08/12, 70 year old Male  REFERRING:  Eddie Bring, MD  SSN: [id number]  PROVIDER:  Raynelle Zimmerman, M.D.    TREATING:  Eddie Zimmerman    LOCATION:  [facility] [phone]    CC/HPI: Seen today for a 4 week f/u. Continues to have intermittent gross hematuria which is worse with more aggressive activity ie yard work. Denies passing any stone fragments. Denies any groin/flank pain.     ALLERGIES: No Allergies    MEDICATIONS: Hydrochlorothiazide 12.5 mg capsule TAKE 1 CAPSULE DAILY  Actos 15 MG Oral Tablet Oral  Allopurinol TABS Oral  Lexapro 10 MG Oral Tablet Oral  Lipitor 10 MG Oral Tablet Oral  Lisinopril 20 MG Oral Tablet Oral  Tamsulosin HCl - 0.4 MG Oral Capsule Oral  Xarelto TABS Oral     GU PSH: Cysto Dilate Stricture (M or F) - 01/31/2016 Cysto Remove Stent FB Sim - 02/05/2016 Cysto/Uretero W/Lithotripsy, Right - 01/31/2016 Cystoscopy - 01/15/2016 Cystoscopy Insert Stent - 2011 Locm 300-399Mg /Ml Iodine,1Ml - 01/11/2016 Renal ESWL - 2011      PSH Notes: Tarsal Osteotomy   NON-GU PSH: Elbow Arthroscopy Gastric Bypass For Obesity Knee Arthroscopy; Dx - 2008 Revise Knee Joint - 01/10/2015, 2008        GU PMH: Kidney Stone - 02/05/2016, Nephrolithiasis, - 12/31/2014 Gross hematuria (Acute), Will proceed with hematuria workup with CT and cysto - 01/04/2016 Encounter for Prostate Cancer screening, Prostate cancer screening - 12/31/2014 Calculus Ureter, Calculus of ureter - 2014 Personal Hx urinary calculi, Nephrolithiasis - 2014 Urethral Stricture, Unspec, Urethral stricture - 2014 Urinary Retention, Unspec, Urinary retention - 2014 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 He does have a history of nephrolithiasis. He again developed hematuria in Oct 2017 prompting further evaluation.   May 2009: Cysto, cytology - negative , CT - multiple bilateral non-obstructing renal calculi  Oct 2017: Cysto - urethral stricture (dilated in OR), cysto unremarkable, CT - bilateral renal calculi   2) Nephrolithiasis: He has a history of calcium oxalate monohydrate and uric acid stones. He has a history of gout and diabetes. He has been noted to have hyperoxaluria successfully treated with calcium supplementation but then developed hypercalciuria and was recommended to begin a thiazide diuretic.   Current treatment: Dietary modification, calcium citrate, HCTZ 12.5 mg   Prior treatment:  Aug 2011: ESWL  Sep 2015: Low urine volume  Oct 2017: R ureteroscopic laser lithotripsy for right renal stones   3) Urinary retention/urethral stricture disease: He performs CIC as needed if he develops a weakened stream.   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   5) Prostate cancer screening: He receives screening through Dr. Delfina Redwood.   6) Complex renal cysts: He was noted to have bilateral complex renal cysts incidentally on a CT in 2017.      NON-GU PMH: Personal history of other diseases of the circulatory system, History of atrial fibrillation - 01/10/2015, History of hypertension, -  2014 Personal history of transient ischemic attack (TIA), and cerebral infarction without residual deficits, History of stroke - 01/10/2015 Gout, Gout - 2014 Personal history of other diseases of the nervous system and sense organs, History of sleep apnea - 2014 Personal history of other endocrine, nutritional and metabolic disease, History of diabetes mellitus - 2014    FAMILY HISTORY: Alzheimer's Disease -  Father Hematuria - Runs In Family nephrolithiasis - Mother   SOCIAL HISTORY: Marital Status: Married Current Smoking Status: Patient has never smoked.  Social Drinker.  Drinks 2 caffeinated drinks per day. 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    Gastrointestinal (Upper):  Patient denies nausea, vomiting, and indigestion/ heartburn.    Gastrointestinal (Lower):  Patient denies diarrhea and constipation.    Constitutional:  Patient denies fever, night sweats, weight loss, and fatigue.    Skin:  Patient denies skin rash/ lesion and itching.    Eyes:  Patient denies blurred vision and double vision.    Ears/ Nose/ Throat:  Patient denies sore throat and sinus problems.    Hematologic/Lymphatic:  Patient denies swollen glands and easy bruising.    Cardiovascular:  Patient denies leg swelling and chest pains.    Respiratory:  Patient denies cough and shortness of breath.    Endocrine:  Patient denies excessive thirst.    Musculoskeletal:  Patient denies back pain and joint pain.    Neurological:  Patient denies dizziness and headaches.    Psychologic:  Patient denies depression and anxiety.    VITAL SIGNS:       04/02/2016 08:10 AM     BP 142/86 mmHg     Pulse 58 /min     Temperature 97.6 F / 36 C     MULTI-SYSTEM PHYSICAL EXAMINATION:      Constitutional: Well-nourished. No physical deformities. Normally developed. Good grooming.      Neurologic / Psychiatric: Oriented to time, oriented to place, oriented to person. No depression, no anxiety, no agitation.      Gastrointestinal: Obese abdomen. No mass, no tenderness, no rigidity.             PAST DATA REVIEWED:     Source Of History:  Patient  Records Review:  Previous Patient Records  Urine Test Review:  Urinalysis    03/21/08 02/09/07  PSA  Total PSA 0.10  0.09     PROCEDURES:    Renal Ultrasound (Limited) - [id number]  RT Kidney: Length: 12.0 cm Depth: 7.0 cm Cortical Width:1.6 cm Width: 6.2 cm    Right Kidney/Ureter:  ? Multiple stones 1) UP= 0.28cm 2) MP= 0.28cm 3) LP= 0.36cm 4) LP= 0.56cm, Multiple cystic areas 1) LP= 2.6x2.4x3.2cm 2) LP= 0.73cm 3) UP= 1.0cm  Bladder:  Not Seen.      No overt hydronephrosis.      KUB - 74000  A single view of the abdomen is obtained.      No change in distal right calcifications compared to previous KUB.     Urinalysis w/Scope - 81001  Dipstick Dipstick Cont'd Micro  Specimen: Voided Bilirubin: Neg WBC/hpf: 0 - 5/hpf  Color: Yellow Ketones: Neg RBC/hpf: 20 - 40/hpf  Appearance: Clear Blood: 3+  Bacteria: NS (Not Seen)  Specific Gravity: 1.020 Protein: Neg Cystals: NS (Not Seen)  pH: 5.5 Urobilinogen: 0.2 Casts: NS (Not Seen)  Glucose: Neg Nitrites: Neg Trichomonas: Not Present   Leukocyte Esterase: Neg Mucous: Not Present    Epithelial Cells: 0 - 5/hpf    Yeast: NS (Not Seen)    Sperm: Not Present    ASSESSMENT:     ICD-10 Details  1 GU:  Calculus Ureter - N20.1 Right, Chronic - No change in distal right calcifications compared to previous KUB.   2  Gross hematuria - R31.0 Improving, Chronic - Slight improvement of gross hematuria.    PLAN:   Medications  Stop Meds: Tramadol Hcl 50 mg tablet 1 tablet PO Q 6 H PRN Start: 01/04/2016  Discontinue: 04/02/2016 - Reason: The medication cycle was completed.    Orders  X-Rays: Renal Ultrasound (Limited) - R  Schedule  Return Visit: Return PRN  Document  Letter(s):  Created for Patient: Clinical Summary   Notes:  Will have Dr. Alinda Money review KUB/RUS and make decision about how to proceed with continual gross hematuria.    E & M CODE: I spent at least 25 minutes face to face with the  patient, more than 50% of that time was spent on counseling and/or coordinating care.   * Signed by Eddie Zimmerman on 04/02/16 at 8:52 AM (EST)*      PHONE NOTE 04/07/2016 at 5:00 PM     Brillion. Kaplowitz          MRN: [id number]  PRIMARY CARE:  Eddie Zimmerman  DOB: [birth year], 70 year old Male  REFERRING:  Eddie Bring, MD  SSN: [id number]  PROVIDER:  Raynelle Zimmerman, M.D.    LOCATION:  [facility] [phone]    Home Phone: [phone] Work Phone: [phone] xcell Cell Phone: [phone]  Regarding: Right ureteral stone Discussion Occurred With: Patient Cobble L. Shedd)  Message: Mr. Crowder was informed that he appears to still have a right ureteral stone that has not passed. He has microscopic hematuria and a calcification on KUB suggesting a remaining distal right ureteral stone. I offered him the options of further time to pass his stone since his recent ultrasound did not show hydronephrosis vs a CT of the pelvis to confirm that he definitively has a stone vs repeat ureteroscopy. After reviewing the pros and cons of each approach, he chose the latter. The potential risks, benefits, and complications of the planned procedure were discussed and informed consent was obtained. He will proceed with cystoscopy, right retrograde pyelography, right ureteroscopic laser lithotripsy/stone removal.   * Signed by Eddie Zimmerman, M.D. on 04/07/16 at 5:03 PM (EST)*

## 2016-04-12 LAB — HEMOGLOBIN A1C
Hgb A1c MFr Bld: 7.7 % — ABNORMAL HIGH (ref 4.8–5.6)
Mean Plasma Glucose: 174 mg/dL

## 2016-04-14 ENCOUNTER — Ambulatory Visit (HOSPITAL_COMMUNITY): Payer: Medicare Other | Admitting: Certified Registered Nurse Anesthetist

## 2016-04-14 ENCOUNTER — Ambulatory Visit (HOSPITAL_COMMUNITY)
Admission: RE | Admit: 2016-04-14 | Discharge: 2016-04-14 | Disposition: A | Discharge status: Home / Self Care | Payer: Medicare Other | Source: Ambulatory Visit | Attending: Urology | Admitting: Urology

## 2016-04-14 ENCOUNTER — Encounter (HOSPITAL_COMMUNITY): Payer: Self-pay | Admitting: *Deleted

## 2016-04-14 ENCOUNTER — Encounter (HOSPITAL_COMMUNITY)
Admission: RE | Disposition: A | Discharge status: Home / Self Care | Payer: Self-pay | Source: Ambulatory Visit | Attending: Urology

## 2016-04-14 DIAGNOSIS — Z7901 Long term (current) use of anticoagulants: Secondary | ICD-10-CM | POA: Insufficient documentation

## 2016-04-14 DIAGNOSIS — Z7984 Long term (current) use of oral hypoglycemic drugs: Secondary | ICD-10-CM | POA: Insufficient documentation

## 2016-04-14 DIAGNOSIS — I5032 Chronic diastolic (congestive) heart failure: Secondary | ICD-10-CM | POA: Diagnosis not present

## 2016-04-14 DIAGNOSIS — Z8673 Personal history of transient ischemic attack (TIA), and cerebral infarction without residual deficits: Secondary | ICD-10-CM | POA: Insufficient documentation

## 2016-04-14 DIAGNOSIS — I4891 Unspecified atrial fibrillation: Secondary | ICD-10-CM | POA: Insufficient documentation

## 2016-04-14 DIAGNOSIS — Z87442 Personal history of urinary calculi: Secondary | ICD-10-CM | POA: Diagnosis not present

## 2016-04-14 DIAGNOSIS — I10 Essential (primary) hypertension: Secondary | ICD-10-CM | POA: Insufficient documentation

## 2016-04-14 DIAGNOSIS — R310 Gross hematuria: Secondary | ICD-10-CM | POA: Insufficient documentation

## 2016-04-14 DIAGNOSIS — N201 Calculus of ureter: Secondary | ICD-10-CM | POA: Diagnosis not present

## 2016-04-14 DIAGNOSIS — G4730 Sleep apnea, unspecified: Secondary | ICD-10-CM | POA: Insufficient documentation

## 2016-04-14 DIAGNOSIS — Z711 Person with feared health complaint in whom no diagnosis is made: Secondary | ICD-10-CM | POA: Diagnosis not present

## 2016-04-14 DIAGNOSIS — E119 Type 2 diabetes mellitus without complications: Secondary | ICD-10-CM | POA: Diagnosis not present

## 2016-04-14 HISTORY — PX: CYSTOSCOPY/URETEROSCOPY/HOLMIUM LASER/STENT PLACEMENT: SHX6546

## 2016-04-14 LAB — GLUCOSE, CAPILLARY
Glucose-Capillary: 156 mg/dL — ABNORMAL HIGH (ref 65–99)
Glucose-Capillary: 143 mg/dL — ABNORMAL HIGH (ref 65–99)

## 2016-04-14 SURGERY — CYSTOSCOPY/URETEROSCOPY/HOLMIUM LASER/STENT PLACEMENT
Anesthesia: General | Laterality: Bilateral

## 2016-04-14 MED ORDER — ONDANSETRON HCL 4 MG/2ML IJ SOLN
INTRAMUSCULAR | Status: DC | PRN
  Administered 2016-04-14: 4 mg via INTRAVENOUS

## 2016-04-14 MED ORDER — HYDROCODONE-ACETAMINOPHEN 5-325 MG PO TABS: 1.0000 | ORAL_TABLET | Freq: Four times a day (QID) | ORAL | 0 refills | Status: DC | PRN

## 2016-04-14 MED ORDER — CEFAZOLIN SODIUM-DEXTROSE 2-4 GM/100ML-% IV SOLN
INTRAVENOUS | Status: AC
  Filled 2016-04-14: qty 100

## 2016-04-14 MED ORDER — MIDAZOLAM HCL 5 MG/5ML IJ SOLN
INTRAMUSCULAR | Status: DC | PRN
  Administered 2016-04-14 (×2): 1 mg via INTRAVENOUS

## 2016-04-14 MED ORDER — FENTANYL CITRATE (PF) 100 MCG/2ML IJ SOLN: 25.0000 ug | INTRAMUSCULAR | Status: DC | PRN

## 2016-04-14 MED ORDER — IOHEXOL 300 MG/ML  SOLN
INTRAMUSCULAR | Status: DC | PRN
  Administered 2016-04-14: 8 mL

## 2016-04-14 MED ORDER — PROPOFOL 10 MG/ML IV BOLUS
INTRAVENOUS | Status: AC
  Filled 2016-04-14: qty 20

## 2016-04-14 MED ORDER — LACTATED RINGERS IV SOLN
INTRAVENOUS | Status: DC
  Administered 2016-04-14: 1000 mL via INTRAVENOUS

## 2016-04-14 MED ORDER — PROMETHAZINE HCL 25 MG/ML IJ SOLN: 6.2500 mg | INTRAMUSCULAR | Status: DC | PRN

## 2016-04-14 MED ORDER — DEXAMETHASONE SODIUM PHOSPHATE 10 MG/ML IJ SOLN
INTRAMUSCULAR | Status: AC
  Filled 2016-04-14: qty 1

## 2016-04-14 MED ORDER — DEXAMETHASONE SODIUM PHOSPHATE 10 MG/ML IJ SOLN
INTRAMUSCULAR | Status: DC | PRN
  Administered 2016-04-14: 10 mg via INTRAVENOUS

## 2016-04-14 MED ORDER — FENTANYL CITRATE (PF) 100 MCG/2ML IJ SOLN
INTRAMUSCULAR | Status: DC | PRN
  Administered 2016-04-14: 50 ug via INTRAVENOUS

## 2016-04-14 MED ORDER — FENTANYL CITRATE (PF) 100 MCG/2ML IJ SOLN
INTRAMUSCULAR | Status: AC
  Filled 2016-04-14: qty 2

## 2016-04-14 MED ORDER — LIDOCAINE 2% (20 MG/ML) 5 ML SYRINGE
INTRAMUSCULAR | Status: DC | PRN
Start: 2016-04-14 — End: 2016-04-14
  Administered 2016-04-14: 80 mg via INTRAVENOUS

## 2016-04-14 MED ORDER — MIDAZOLAM HCL 2 MG/2ML IJ SOLN
INTRAMUSCULAR | Status: AC
  Filled 2016-04-14: qty 2

## 2016-04-14 MED ORDER — PROPOFOL 10 MG/ML IV BOLUS
INTRAVENOUS | Status: DC | PRN
Start: 2016-04-14 — End: 2016-04-14
  Administered 2016-04-14: 200 mg via INTRAVENOUS

## 2016-04-14 MED ORDER — SODIUM CHLORIDE 0.9 % IR SOLN
Status: DC | PRN
  Administered 2016-04-14: 4000 mL

## 2016-04-14 MED ORDER — CEFAZOLIN SODIUM-DEXTROSE 2-4 GM/100ML-% IV SOLN
2.0000 g | INTRAVENOUS | Status: AC
  Administered 2016-04-14: 2 g via INTRAVENOUS
  Filled 2016-04-14: qty 100

## 2016-04-14 MED ORDER — ROCURONIUM BROMIDE 10 MG/ML (PF) SYRINGE
PREFILLED_SYRINGE | INTRAVENOUS | Status: DC | PRN
  Administered 2016-04-14: 40 mg via INTRAVENOUS

## 2016-04-14 MED ORDER — KETOROLAC TROMETHAMINE 30 MG/ML IJ SOLN: 30.0000 mg | Freq: Once | INTRAMUSCULAR | Status: DC | PRN

## 2016-04-14 MED ORDER — SUGAMMADEX SODIUM 500 MG/5ML IV SOLN
INTRAVENOUS | Status: DC | PRN
  Administered 2016-04-14: 300 mg via INTRAVENOUS

## 2016-04-14 MED ORDER — ONDANSETRON HCL 4 MG/2ML IJ SOLN
INTRAMUSCULAR | Status: AC
  Filled 2016-04-14: qty 2

## 2016-04-14 MED ORDER — SUCCINYLCHOLINE CHLORIDE 200 MG/10ML IV SOSY
PREFILLED_SYRINGE | INTRAVENOUS | Status: DC | PRN
  Administered 2016-04-14: 60 mg via INTRAVENOUS
  Administered 2016-04-14: 120 mg via INTRAVENOUS

## 2016-04-14 SURGICAL SUPPLY — 18 items
BAG URO CATCHER STRL LF (MISCELLANEOUS) ×3 IMPLANT
BASKET ZERO TIP NITINOL 2.4FR (BASKET) IMPLANT
BSKT STON RTRVL ZERO TP 2.4FR (BASKET)
CATH INTERMIT  6FR 70CM (CATHETERS) ×2 IMPLANT
CLOTH BEACON ORANGE TIMEOUT ST (SAFETY) ×3 IMPLANT
FIBER LASER FLEXIVA 365 (UROLOGICAL SUPPLIES) IMPLANT
FIBER LASER TRAC TIP (UROLOGICAL SUPPLIES) IMPLANT
GLOVE BIOGEL M STRL SZ7.5 (GLOVE) ×3 IMPLANT
GOWN STRL REUS W/TWL LRG LVL3 (GOWN DISPOSABLE) ×6 IMPLANT
GUIDEWIRE ANG ZIPWIRE 038X150 (WIRE) IMPLANT
GUIDEWIRE STR DUAL SENSOR (WIRE) ×3 IMPLANT
IV NS 1000ML (IV SOLUTION) ×3
IV NS 1000ML BAXH (IV SOLUTION) ×1 IMPLANT
MANIFOLD NEPTUNE II (INSTRUMENTS) ×3 IMPLANT
PACK CYSTO (CUSTOM PROCEDURE TRAY) ×3 IMPLANT
SHEATH ACCESS URETERAL 38CM (SHEATH) IMPLANT
TUBING CONNECTING 10 (TUBING) ×2 IMPLANT
TUBING CONNECTING 10' (TUBING) ×1

## 2016-04-14 NOTE — Anesthesia Procedure Notes (Addendum)
Procedure Name: Intubation Date/Time: 04/14/2016 1:00 PM Performed by: West Pugh Pre-anesthesia Checklist: Patient identified, Emergency Drugs available, Suction available, Patient being monitored and Timeout performed Patient Re-evaluated:Patient Re-evaluated prior to inductionOxygen Delivery Method: Circle system utilized Preoxygenation: Pre-oxygenation with 100% oxygen Intubation Type: IV induction Ventilation: Mask ventilation throughout procedure, Two handed mask ventilation required and Oral airway inserted - appropriate to patient size Laryngoscope Size: 4 and Mac Grade View: Grade II Tube type: Parker flex tip Laser Tube: Cuffed inflated with minimal occlusive pressure - saline Tube size: 7.5 mm Number of attempts: 3 Airway Equipment and Method: Bougie stylet Placement Confirmation: ETT inserted through vocal cords under direct vision,  positive ETCO2,  CO2 detector and breath sounds checked- equal and bilateral Secured at: 22 cm Tube secured with: Tape Dental Injury: Teeth and Oropharynx as per pre-operative assessment  Difficulty Due To: Difficulty was anticipated Comments: Patient having large leak with #5 LMA with maximum allowed air placed in cuff. Decision made with anesthesia team to place ETT. Glidescope utilized. Tube would not pass the glottic opening. Mask ventilation with oral airway in place during each attempt. CRNA attempt x1 without success with glidescope. Dr Ola Spurr attempt x 1 with glidescope not successful. 2nd attempt with Mac 4 with bougie stylet successful.

## 2016-04-14 NOTE — Op Note (Signed)
Preoperative diagnosis: Probable right ureteral calculus  Postoperative diagnosis: History of right ureteral calculus  Procedure:  1. Cystoscopy 2. Right ureteroscopy 3. Bilateral retrograde pyelography with interpretation  Surgeon: Pryor Curia. M.D.  Anesthesia: General  Complications: None  Intraoperative findings: Bilateral retrograde pyelography demonstrated a normal caliber ureter on the right without other abnormalities and a normal caliber ureter on the left without other abnormalities except a filling defect in a non-obstructing position in the renal pelvis as known.   EBL: Minimal  Indication: Eddie Zimmerman is a 70 y.o. year old patient with urolithiasis.  He had undergone right ureteroscopic laser lithotripsy recently.  On follow up, he has had persistent microscopic hematuria and presumed right ureteral distal calculi based on plain film imaging.  I offered the patient a repeat pelvic CT vs ureteroscopy and he chose to proceed with the latter approach. After reviewing the management options for treatment, the patient elected to proceed with the above surgical procedure(s). We have discussed the potential benefits and risks of the procedure, side effects of the proposed treatment, the likelihood of the patient achieving the goals of the procedure, and any potential problems that might occur during the procedure or recuperation. Informed consent has been obtained.  Description of procedure:  The patient was taken to the operating room and general anesthesia was induced.  The patient was placed in the dorsal lithotomy position, prepped and draped in the usual sterile fashion, and preoperative antibiotics were administered. A preoperative time-out was performed.   Cystourethroscopy was performed.  The patient's urethra was examined and demonstrated changes with prior surgical reconstruction. The bladder was then systematically examined in its entirety. There was no evidence  for any bladder tumors, stones, or other mucosal pathology except for postoperative changes from his prior surgery including a diverticulum near the bladder neck.  Attention then turned to the right ureteral orifice and a ureteral catheter was used to intubate the ureteral orifice.  Omnipaque contrast was injected through the ureteral catheter and a retrograde pyelogram was performed with findings as dictated above.  A 0.38 sensor guidewire was then advanced up the right ureter into the renal pelvis under fluoroscopic guidance. The 6 Fr semirigid ureteroscope was then advanced into the ureter next to the guidewire.  No calculus was identified.  Considering his persistent microscopic hematuria, I proceeded with a left retrograde to rule out a left ureteral stone. Attention then turned to the left ureteral orifice and a ureteral catheter was used to intubate the ureteral orifice.  Omnipaque contrast was injected through the ureteral catheter and a retrograde pyelogram was performed with findings as dictated above.  No filling defects were noted in the ureter.  The wire was then removed.  The bladder was then emptied and the procedure ended.  The patient appeared to tolerate the procedure well and without complications.  The patient was able to be awakened and transferred to the recovery unit in satisfactory condition.

## 2016-04-14 NOTE — Discharge Instructions (Signed)
1. You may see some blood in the urine and may have some burning with urination for 48-72 hours. You also may notice that you have to urinate more frequently or urgently after your procedure which is normal.  °2. You should call should you develop an inability urinate, fever > 101, persistent nausea and vomiting that prevents you from eating or drinking to stay hydrated.  °

## 2016-04-14 NOTE — Transfer of Care (Signed)
Immediate Anesthesia Transfer of Care Note  Patient: Eddie Zimmerman  Procedure(s) Performed: Procedure(s) with comments: CYSTOSCOPY/URETEROSCOPY left ureter/RETROGRADE PYELOGRAM right ureter (Bilateral) - WANTS ROOM 8  Patient Location: PACU  Anesthesia Type:General  Level of Consciousness:  sedated, patient cooperative and responds to stimulation  Airway & Oxygen Therapy:Patient Spontanous Breathing and Patient connected to face mask oxgen  Post-op Assessment:  Report given to PACU RN and Post -op Vital signs reviewed and stable  Post vital signs:  Reviewed and stable  Last Vitals:  Vitals:   04/14/16 1107  BP: 138/79  Pulse: 65  Resp: 18  Temp: 37 C    Complications: No apparent anesthesia complications

## 2016-04-14 NOTE — Anesthesia Preprocedure Evaluation (Signed)
Anesthesia Evaluation  Patient identified by MRN, date of birth, ID band Patient awake    Reviewed: Allergy & Precautions, NPO status , Patient's Chart, lab work & pertinent test results  Airway Mallampati: II  TM Distance: >3 FB Neck ROM: Full    Dental no notable dental hx.    Pulmonary sleep apnea and Continuous Positive Airway Pressure Ventilation ,    Pulmonary exam normal breath sounds clear to auscultation       Cardiovascular hypertension, Normal cardiovascular exam+ dysrhythmias Atrial Fibrillation  Rhythm:Regular Rate:Normal     Neuro/Psych CVA negative psych ROS   GI/Hepatic negative GI ROS, Neg liver ROS,   Endo/Other  diabetes  Renal/GU negative Renal ROS  negative genitourinary   Musculoskeletal negative musculoskeletal ROS (+)   Abdominal   Peds negative pediatric ROS (+)  Hematology negative hematology ROS (+)   Anesthesia Other Findings   Reproductive/Obstetrics negative OB ROS                             Anesthesia Physical Anesthesia Plan  ASA: III  Anesthesia Plan: General   Post-op Pain Management:    Induction: Intravenous  Airway Management Planned: LMA  Additional Equipment:   Intra-op Plan:   Post-operative Plan: Extubation in OR  Informed Consent: I have reviewed the patients History and Physical, chart, labs and discussed the procedure including the risks, benefits and alternatives for the proposed anesthesia with the patient or authorized representative who has indicated his/her understanding and acceptance.   Dental advisory given  Plan Discussed with: CRNA and Surgeon  Anesthesia Plan Comments:         Anesthesia Quick Evaluation

## 2016-04-14 NOTE — Interval H&P Note (Signed)
History and Physical Interval Note:  04/14/2016 12:10 PM   Eddie Zimmerman  has presented today for surgery, with the diagnosis of RIGHT URETERAL CALCULUS  The various methods of treatment have been discussed with the patient and family. After consideration of risks, benefits and other options for treatment, the patient has consented to  Procedure(s) with comments: CYSTOSCOPY/URETEROSCOPY/RETROGRADE PYELOGRAM/HOLMIUM LASER/STENT PLACEMENT (Right) - WANTS ROOM 8 as a surgical intervention .  The patient's history has been reviewed, patient examined, no change in status, stable for surgery.  I have reviewed the patient's chart and labs.  Questions were answered to the patient's satisfaction.     Semya Klinke,LES

## 2016-04-15 NOTE — Anesthesia Postprocedure Evaluation (Addendum)
Anesthesia Post Note  Patient: Eddie Zimmerman  Procedure(s) Performed: Procedure(s) (LRB): CYSTOSCOPY/URETEROSCOPY   RETROGRADE PYELOGRAM (Bilateral)  Patient location during evaluation: PACU Anesthesia Type: General Level of consciousness: awake and alert Pain management: pain level controlled Vital Signs Assessment: post-procedure vital signs reviewed and stable Respiratory status: spontaneous breathing, nonlabored ventilation, respiratory function stable and patient connected to nasal cannula oxygen Cardiovascular status: blood pressure returned to baseline and stable Postop Assessment: no signs of nausea or vomiting Anesthetic complications: no       Last Vitals:  Vitals:   04/14/16 1428 04/14/16 1500  BP: (!) 122/53 127/72  Pulse: (!) 59 63  Resp: 16 18  Temp: 36.8 C     Last Pain:  Vitals:   04/14/16 1107  TempSrc: Oral                 Tiajuana Amass

## 2016-04-18 LAB — CUP PACEART REMOTE DEVICE CHECK
Date Time Interrogation Session: 20171106191104
Implantable Pulse Generator Implant Date: 20160316

## 2016-04-18 NOTE — Progress Notes (Signed)
Carelink summary report received. Battery status OK. Normal device function. No new symptom episodes, tachy episodes, brady, or pause episodes. No new AF episodes. Monthly summary reports and ROV/PRN 

## 2016-05-02 ENCOUNTER — Ambulatory Visit (INDEPENDENT_AMBULATORY_CARE_PROVIDER_SITE_OTHER): Payer: Medicare Other | Admitting: *Deleted

## 2016-05-02 DIAGNOSIS — I639 Cerebral infarction, unspecified: Secondary | ICD-10-CM

## 2016-05-02 NOTE — Progress Notes (Signed)
Carelink Summary Report / Loop Recorder 

## 2016-05-07 DIAGNOSIS — L578 Other skin changes due to chronic exposure to nonionizing radiation: Secondary | ICD-10-CM | POA: Diagnosis not present

## 2016-05-07 DIAGNOSIS — D225 Melanocytic nevi of trunk: Secondary | ICD-10-CM | POA: Diagnosis not present

## 2016-05-07 DIAGNOSIS — Z1283 Encounter for screening for malignant neoplasm of skin: Secondary | ICD-10-CM | POA: Diagnosis not present

## 2016-05-07 DIAGNOSIS — X32XXXD Exposure to sunlight, subsequent encounter: Secondary | ICD-10-CM | POA: Diagnosis not present

## 2016-05-07 DIAGNOSIS — L570 Actinic keratosis: Secondary | ICD-10-CM | POA: Diagnosis not present

## 2016-05-07 DIAGNOSIS — B353 Tinea pedis: Secondary | ICD-10-CM | POA: Diagnosis not present

## 2016-05-13 DIAGNOSIS — N201 Calculus of ureter: Secondary | ICD-10-CM | POA: Diagnosis not present

## 2016-05-22 LAB — CUP PACEART REMOTE DEVICE CHECK
Date Time Interrogation Session: 20171206193741
Implantable Pulse Generator Implant Date: 20160316

## 2016-05-22 NOTE — Progress Notes (Signed)
Carelink summary report received. Battery status OK. Normal device function. No new symptom episodes, tachy episodes, brady, or pause episodes. No new AF episodes. Monthly summary reports and ROV/PRN 

## 2016-05-27 ENCOUNTER — Telehealth: Payer: Self-pay | Admitting: Cardiology

## 2016-05-27 NOTE — Telephone Encounter (Signed)
Spoke w/ pt and requested that he send a manual transmission b/c his home monitor has not updated in at least 14 days. Pt stated that he is currently away from his monitor and will not be back near it until 06-04-16 and he will send the transmission at that time.

## 2016-06-02 ENCOUNTER — Ambulatory Visit (INDEPENDENT_AMBULATORY_CARE_PROVIDER_SITE_OTHER): Payer: Medicare Other | Admitting: *Deleted

## 2016-06-02 DIAGNOSIS — I639 Cerebral infarction, unspecified: Secondary | ICD-10-CM

## 2016-06-03 NOTE — Progress Notes (Signed)
Carelink Summary Report / Loop Recorder 

## 2016-06-16 LAB — CUP PACEART REMOTE DEVICE CHECK
MDC IDC PG IMPLANT DT: 20160316
MDC IDC SESS DTM: 20180105203731

## 2016-06-16 NOTE — Progress Notes (Signed)
Carelink summary report received. Battery status OK. Normal device function. No new symptom episodes, tachy episodes, brady, or pause episodes. No new AF episodes. Monthly summary reports and ROV/PRN 

## 2016-06-26 ENCOUNTER — Telehealth: Payer: Self-pay | Admitting: Cardiology

## 2016-06-26 NOTE — Telephone Encounter (Signed)
LMOVM requesting that pt send manual transmission b/c home monitor has not updated in at least 14 days.    

## 2016-06-28 LAB — CUP PACEART REMOTE DEVICE CHECK
Implantable Pulse Generator Implant Date: 20160316
MDC IDC SESS DTM: 20180204203724

## 2016-06-28 NOTE — Progress Notes (Signed)
Carelink summary report received. Battery status OK. Normal device function. No new symptom episodes, tachy episodes, brady, or pause episodes. No new AF episodes. Monthly summary reports and ROV/PRN 

## 2016-07-01 ENCOUNTER — Ambulatory Visit (INDEPENDENT_AMBULATORY_CARE_PROVIDER_SITE_OTHER): Payer: Medicare Other | Admitting: *Deleted

## 2016-07-01 DIAGNOSIS — I639 Cerebral infarction, unspecified: Secondary | ICD-10-CM | POA: Diagnosis not present

## 2016-07-02 NOTE — Progress Notes (Signed)
Carelink Summary Report / Loop Recorder 

## 2016-07-03 ENCOUNTER — Encounter: Payer: Self-pay | Admitting: Cardiology

## 2016-07-16 ENCOUNTER — Other Ambulatory Visit: Payer: Self-pay | Admitting: Interventional Cardiology

## 2016-07-22 DIAGNOSIS — M1812 Unilateral primary osteoarthritis of first carpometacarpal joint, left hand: Secondary | ICD-10-CM | POA: Diagnosis not present

## 2016-07-23 ENCOUNTER — Ambulatory Visit (INDEPENDENT_AMBULATORY_CARE_PROVIDER_SITE_OTHER): Payer: Medicare Other | Admitting: Internal Medicine

## 2016-07-23 ENCOUNTER — Encounter: Payer: Self-pay | Admitting: Internal Medicine

## 2016-07-23 VITALS — BP 112/74 | HR 64 | Ht 70.0 in | Wt 259.0 lb

## 2016-07-23 DIAGNOSIS — I480 Paroxysmal atrial fibrillation: Secondary | ICD-10-CM

## 2016-07-23 DIAGNOSIS — I639 Cerebral infarction, unspecified: Secondary | ICD-10-CM | POA: Diagnosis not present

## 2016-07-23 LAB — CUP PACEART INCLINIC DEVICE CHECK
MDC IDC PG IMPLANT DT: 20160316
MDC IDC SESS DTM: 20180328130017

## 2016-07-23 NOTE — Patient Instructions (Addendum)
Your physician recommends that you continue on your current medications as directed. Please refer to the Current Medication list given to you today.  You are scheduled in Short Stay at [facility] on August 05, 2016  with Dr. Lovena Le to remove your loop recorder.  Please arrive by 6:30 am that day.   There are no restrictions regarding eating/drinking.  You may take all your medications like normal prior to the procedure.  You may drive yourself.   Your physician wants you to follow-up in: 1 year with Dr. Lovena Le.  You will receive a reminder letter in the mail two months in advance. If you don't receive a letter, please call our office to schedule the follow-up appointment.

## 2016-07-23 NOTE — Progress Notes (Signed)
HPI [name] returns today for implantable loop recorder followup. The patient is a very pleasant 71 year old man who was sustained a stroke over 2 years ago. He was found to have atrial fib on his ILR and has been on Xarelto and done well. He is bothered by gout. He has no other complaints.   No Known Allergies   Current Outpatient Prescriptions  Medication Sig Dispense Refill  . allopurinol (ZYLOPRIM) 300 MG tablet Take 300 mg by mouth daily with lunch.     [name] amoxicillin (AMOXIL) 500 MG capsule Take 2,000 mg by mouth See admin instructions. Take 4 capsules (2000 mg) by mouth one hour prior to dental procedure - last procedure 01/15/16    . Aspirin-Acetaminophen-Caffeine (EXCEDRIN PO) Take 1-2 tablets by mouth daily as needed (headache).    . B Complex-C (SUPER B COMPLEX PO) Take 1 tablet by mouth daily with lunch.    . Coenzyme Q10 300 MG CAPS Take 300 mg by mouth daily with lunch.    . escitalopram (LEXAPRO) 10 MG tablet Take 10 mg by mouth daily with lunch.     . hydrochlorothiazide (MICROZIDE) 12.5 MG capsule Take 12.5 mg by mouth daily with lunch.    . lisinopril (PRINIVIL,ZESTRIL) 40 MG tablet Take 1/2 tablet (20 mg) twice daily (Patient taking differently: Take 40 mg by mouth daily with lunch. ) 60 tablet 6  . Multiple Vitamin (MULTIVITAMIN WITH MINERALS) TABS tablet Take 1 tablet by mouth daily with lunch. Men's One a Day    . pioglitazone (ACTOS) 15 MG tablet Take 15 mg by mouth daily with lunch.     . Polyvinyl Alcohol-Povidone (CLEAR EYES NATURAL TEARS OP) Place 1 drop into both eyes 2 (two) times daily as needed (dry eyes).    . pregabalin (LYRICA) 75 MG capsule Take 75 mg by mouth daily with lunch.    [name] PRESCRIPTION MEDICATION Inhale into the lungs at bedtime. CPAP    . rosuvastatin (CRESTOR) 5 MG tablet Take 1 tablet (5 mg total) by mouth daily. 90 tablet 2  . vitamin B-12 (CYANOCOBALAMIN) 1000 MCG tablet Take 1,000 mcg by mouth daily with lunch.    Alveda Reasons 20 MG  TABS tablet TAKE 1 TABLET DAILY WITH SUPPER 90 tablet 2   No current facility-administered medications for this visit.      Past Medical History:  Diagnosis Date  . Arthritis    hands, wrist & Knee   . Depression   . Diabetes mellitus without complication ([place])   . Dysrhythmia    ? Atrial fibrillation episode" Loop Recorder" implanted left chest- [facility] follows  . Gout   . Hearing impaired person, bilateral    hearing aids used  . History of kidney stones LAST MAY 2015  . Hx of laparoscopic gastric banding    remains in place  . Hypertension   . Sleep apnea    cpap use, SETTING OF 13  . Stroke ([place]) 06-07-2014    ROS:   All systems reviewed and negative except as noted in the HPI.   Past Surgical History:  Procedure Laterality Date  . ARTHROSCOPIC REPAIR PCL Bilateral    right- x1, left- x3   . COLONOSCOPY WITH PROPOFOL N/A 10/24/2014   Procedure: COLONOSCOPY WITH PROPOFOL;  Surgeon: Garlan Fair, MD;  Location: [facility];  Service: Endoscopy;  Laterality: N/A;  . CYSTOSCOPY WITH RETROGRADE PYELOGRAM, URETEROSCOPY AND STENT PLACEMENT Right 01/31/2016   Procedure: CYSTOSCOPY WITH RETROGRADE PYELOGRAM, URETEROSCOPY AND  STENT PLACEMENT;  Surgeon: [name], MD;  Location: [facility];  Service: Urology;  Laterality: Right;  . CYSTOSCOPY WITH URETHRAL DILATATION N/A 01/31/2016   Procedure: CYSTOSCOPY WITH URETHRAL BALLOON DILATATION;  Surgeon: [name], MD;  Location: [facility];  Service: Urology;  Laterality: N/A;  . CYSTOSCOPY/URETEROSCOPY/HOLMIUM LASER/STENT PLACEMENT Bilateral 04/14/2016   Procedure: CYSTOSCOPY/URETEROSCOPY   RETROGRADE PYELOGRAM;  Surgeon: [name], MD;  Location: [facility];  Service: Urology;  Laterality: Bilateral;  . HERNIA REPAIR     abdominal  . HOLMIUM LASER APPLICATION Right 33/05/9516   Procedure: HOLMIUM LASER APPLICATION;  Surgeon: [name], MD;  Location: [facility];  Service: Urology;  Laterality: Right;  . JOINT REPLACEMENT Left     . KNEE SURGERY Left   . LAPAROSCOPIC GASTRIC BANDING    . LOOP RECORDER IMPLANT N/A 07/12/2014   Procedure: LOOP RECORDER IMPLANT;  Surgeon: Evans Lance, MD;  Location: [facility] CATH LAB;  Service: Cardiovascular;  Laterality: N/A;  . TARSAL TUNNEL RELEASE Bilateral 2005  . TEE WITHOUT CARDIOVERSION N/A 06/14/2014   Procedure: TRANSESOPHAGEAL ECHOCARDIOGRAM (TEE);  Surgeon: Sueanne Margarita, MD;  Location: [facility] ENDOSCOPY;  Service: Cardiovascular;  Laterality: N/A;  . TONSILLECTOMY    . TOTAL KNEE ARTHROPLASTY Right 06/07/2014   Procedure: TOTAL KNEE ARTHROPLASTY;  Surgeon: Kerin Salen, MD;  Location: [place];  Service: Orthopedics;  Laterality: Right;     Family History  Problem Relation Age of Onset  . AAA (abdominal aortic aneurysm) Mother   . Hypertension Mother   . Alzheimer's disease Father   . Cirrhosis Brother     of the liver  . AAA (abdominal aortic aneurysm) Brother   . Hypertension Brother   . Heart attack Neg Hx      Social History   Social History  . Marital status: Married    Spouse name: [name]  . Number of children: 3  . Years of education: Master's   Occupational History  . [employer]    Social History Main Topics  . Smoking status: Never Smoker  . Smokeless tobacco: Never Used  . Alcohol use 0.0 oz/week     Comment: rare 1 PER month  . Drug use: No  . Sexual activity: Not on file   Other Topics Concern  . Not on file   Social History Narrative   Lives at home with wife.   Left handed.    Caffeine use: Drinks 1 glass coffee, tea, soda per day. Varies.     BP 112/74   Pulse 64   Ht 5\' 10"  (1.778 m)   Wt 259 lb (117.5 kg)   SpO2 96%   BMI 37.16 kg/m   Physical Exam:  Well appearing overweight 71 year old man, NAD HEENT: Unremarkable Neck:  6 cm JVD, no thyromegally Back:  No CVA tenderness Lungs:  Clear, with no wheezes, rales, or rhonchi.  HEART:  Regular rate rhythm, no murmurs, no rubs, no clicks Abd:  soft, positive bowel sounds, no  organomegally, no rebound, no guarding Ext:  2 plus pulses, no edema, no cyanosis, no clubbing Skin:  No rashes no nodules Neuro:  CN II through XII intact, motor grossly intact   Assess/Plan: 1. Cryptogenic stroke - he has had no recurrent symptoms. Will remove his ILR next week at his preference. 2. Atrial fib - he is asymptomatic with no recurrent atrial fib. Will follow. 3. HTN - his blood pressure is well controlled. No change in meds.  4. Obesity - he is encouraged to  lose weight.  Mikle Bosworth.D.

## 2016-07-31 ENCOUNTER — Ambulatory Visit (INDEPENDENT_AMBULATORY_CARE_PROVIDER_SITE_OTHER): Payer: Medicare Other | Admitting: *Deleted

## 2016-07-31 DIAGNOSIS — I639 Cerebral infarction, unspecified: Secondary | ICD-10-CM

## 2016-08-01 LAB — CUP PACEART REMOTE DEVICE CHECK
MDC IDC PG IMPLANT DT: 20160316
MDC IDC SESS DTM: 20180306210842

## 2016-08-01 NOTE — Progress Notes (Signed)
Carelink Summary Report / Loop Recorder 

## 2016-08-04 ENCOUNTER — Encounter: Payer: Self-pay | Admitting: Interventional Cardiology

## 2016-08-05 ENCOUNTER — Ambulatory Visit (HOSPITAL_COMMUNITY)
Admission: RE | Admit: 2016-08-05 | Discharge: 2016-08-05 | Disposition: A | Discharge status: Home / Self Care | Payer: Medicare Other | Source: Ambulatory Visit | Attending: Internal Medicine | Admitting: Internal Medicine

## 2016-08-05 ENCOUNTER — Encounter (HOSPITAL_COMMUNITY): Payer: Self-pay | Admitting: Internal Medicine

## 2016-08-05 ENCOUNTER — Encounter (HOSPITAL_COMMUNITY)
Admission: RE | Disposition: A | Discharge status: Home / Self Care | Payer: Self-pay | Source: Ambulatory Visit | Attending: Internal Medicine

## 2016-08-05 DIAGNOSIS — Z9884 Bariatric surgery status: Secondary | ICD-10-CM | POA: Insufficient documentation

## 2016-08-05 DIAGNOSIS — Z7901 Long term (current) use of anticoagulants: Secondary | ICD-10-CM | POA: Insufficient documentation

## 2016-08-05 DIAGNOSIS — F329 Major depressive disorder, single episode, unspecified: Secondary | ICD-10-CM | POA: Diagnosis not present

## 2016-08-05 DIAGNOSIS — M109 Gout, unspecified: Secondary | ICD-10-CM | POA: Diagnosis not present

## 2016-08-05 DIAGNOSIS — E119 Type 2 diabetes mellitus without complications: Secondary | ICD-10-CM | POA: Diagnosis not present

## 2016-08-05 DIAGNOSIS — I10 Essential (primary) hypertension: Secondary | ICD-10-CM | POA: Insufficient documentation

## 2016-08-05 DIAGNOSIS — I639 Cerebral infarction, unspecified: Secondary | ICD-10-CM | POA: Diagnosis not present

## 2016-08-05 DIAGNOSIS — I4891 Unspecified atrial fibrillation: Secondary | ICD-10-CM | POA: Insufficient documentation

## 2016-08-05 DIAGNOSIS — G4730 Sleep apnea, unspecified: Secondary | ICD-10-CM | POA: Diagnosis not present

## 2016-08-05 DIAGNOSIS — Z8673 Personal history of transient ischemic attack (TIA), and cerebral infarction without residual deficits: Secondary | ICD-10-CM | POA: Diagnosis not present

## 2016-08-05 DIAGNOSIS — Z4509 Encounter for adjustment and management of other cardiac device: Secondary | ICD-10-CM | POA: Diagnosis not present

## 2016-08-05 HISTORY — PX: LOOP RECORDER REMOVAL: EP1215

## 2016-08-05 SURGERY — LOOP RECORDER REMOVAL

## 2016-08-05 MED ORDER — LIDOCAINE HCL (PF) 1 % IJ SOLN
INTRAMUSCULAR | Status: AC
  Filled 2016-08-05: qty 30

## 2016-08-05 MED ORDER — LIDOCAINE HCL (PF) 1 % IJ SOLN
INTRAMUSCULAR | Status: DC | PRN
  Administered 2016-08-05: 5 mL via INTRADERMAL

## 2016-08-05 SURGICAL SUPPLY — 1 items: PACK LOOP INSERTION (CUSTOM PROCEDURE TRAY) ×3 IMPLANT

## 2016-08-05 NOTE — Discharge Instructions (Signed)
Sterile Tape Wound Care °Some cuts and wounds can be closed using sterile tape, also called skin adhesive strips. Skin adhesive strips can be used for shallow (superficial) and simple cuts, wounds, lacerations, and some surgical incisions. These strips act in place of stitches, or in addition to stitches, to hold the edges of the wound together to allow for better healing. Unlike stitches, the adhesive strips do not require needles or anesthetic medicine for placement. The strips usually fall off on their own as the wound is healing. It is important to take proper care of your wound at home while it heals. °How to care for a sterile tape wound °· Try to keep the area around your wound clean and dry. Do not allow the adhesive strips to get wet for the first 12 hours. °· Do not use any soaps or ointments on the wound for the first 12 hours. °· If a bandage (dressing) has been applied, keep it dry. °· Follow instructions from your health care provider about how often to change the dressing. °¨ Wash your hands with soap and water before you change your dressing. If soap and water are not available, use hand sanitizer. °¨ Change your dressing as told by your health care provider. °¨ Leave adhesive strips in place. These skin closures may need to stay in place for 2 weeks or longer. If adhesive strip edges start to loosen and curl up, you may trim the loose edges. Do not remove adhesive strips completely unless your health care provider tells you to do that. °· Do not scratch, rub, or pick at the wound area. °· Protect the wound from further injury until it is healed. °· Protect the wound from sun and tanning bed exposure while it is healing, and for several weeks after healing. °· Check the wound every day for signs of infection. Check for: °¨ More redness, swelling, or pain. °¨ More fluid or blood. °¨ Warmth. °¨ Pus or a bad smell. °Follow these instructions at home: °· Take over-the-counter and prescription medicines  only as told by your health care provider. °· Keep all follow-up visits as told by your health care provider. This is important. °Contact a health care provider if: °· Your adhesive strips become soaked with blood or fall off before the wound has healed. The tape will need to be replaced. °· You have a fever. °Get help right away if: °· You have chills. °· You develop a rash after the strips are applied. °· You have a red streak that goes away from the wound. °· You have more redness, swelling, or pain around your wound. °· You have more fluid or blood coming from your wound. °· Your wound feels warm to the touch. °· You have pus or a bad smell coming from your wound. °· Your wound breaks open. °This information is not intended to replace advice given to you by your health care provider. Make sure you discuss any questions you have with your health care provider. °Document Released: 05/22/2004 Document Revised: 03/07/2016 Document Reviewed: 03/07/2016 °Elsevier Interactive Patient Education © 2017 [employer] ° °

## 2016-08-05 NOTE — H&P (Addendum)
HPI [name] returns today for implantable loop recorder followup. The patient is a very pleasant 71 year old man who was sustained a stroke over 2 years ago. He was found to have atrial fib on his ILR and has been on Xarelto and done well. He is bothered by gout. He has no other complaints.   No Known Allergies         Current Outpatient Prescriptions  Medication Sig Dispense Refill  . allopurinol (ZYLOPRIM) 300 MG tablet Take 300 mg by mouth daily with lunch.     [name] amoxicillin (AMOXIL) 500 MG capsule Take 2,000 mg by mouth See admin instructions. Take 4 capsules (2000 mg) by mouth one hour prior to dental procedure - last procedure 01/15/16    . Aspirin-Acetaminophen-Caffeine (EXCEDRIN PO) Take 1-2 tablets by mouth daily as needed (headache).    . B Complex-C (SUPER B COMPLEX PO) Take 1 tablet by mouth daily with lunch.    . Coenzyme Q10 300 MG CAPS Take 300 mg by mouth daily with lunch.    . escitalopram (LEXAPRO) 10 MG tablet Take 10 mg by mouth daily with lunch.     . hydrochlorothiazide (MICROZIDE) 12.5 MG capsule Take 12.5 mg by mouth daily with lunch.    . lisinopril (PRINIVIL,ZESTRIL) 40 MG tablet Take 1/2 tablet (20 mg) twice daily (Patient taking differently: Take 40 mg by mouth daily with lunch. ) 60 tablet 6  . Multiple Vitamin (MULTIVITAMIN WITH MINERALS) TABS tablet Take 1 tablet by mouth daily with lunch. Men's One a Day    . pioglitazone (ACTOS) 15 MG tablet Take 15 mg by mouth daily with lunch.     . Polyvinyl Alcohol-Povidone (CLEAR EYES NATURAL TEARS OP) Place 1 drop into both eyes 2 (two) times daily as needed (dry eyes).    . pregabalin (LYRICA) 75 MG capsule Take 75 mg by mouth daily with lunch.    [name] PRESCRIPTION MEDICATION Inhale into the lungs at bedtime. CPAP    . rosuvastatin (CRESTOR) 5 MG tablet Take 1 tablet (5 mg total) by mouth daily. 90 tablet 2  . vitamin B-12 (CYANOCOBALAMIN) 1000 MCG tablet Take 1,000 mcg by mouth daily with lunch.     Alveda Reasons 20 MG TABS tablet TAKE 1 TABLET DAILY WITH SUPPER 90 tablet 2   No current facility-administered medications for this visit.          Past Medical History:  Diagnosis Date  . Arthritis    hands, wrist & Knee   . Depression   . Diabetes mellitus without complication ([place])   . Dysrhythmia    ? Atrial fibrillation episode" Loop Recorder" implanted left chest- [facility] follows  . Gout   . Hearing impaired person, bilateral    hearing aids used  . History of kidney stones LAST MAY 2015  . Hx of laparoscopic gastric banding    remains in place  . Hypertension   . Sleep apnea    cpap use, SETTING OF 13  . Stroke ([place]) 06-07-2014    ROS:   All systems reviewed and negative except as noted in the HPI.        Past Surgical History:  Procedure Laterality Date  . ARTHROSCOPIC REPAIR PCL Bilateral    right- x1, left- x3   . COLONOSCOPY WITH PROPOFOL N/A 10/24/2014   Procedure: COLONOSCOPY WITH PROPOFOL;  Surgeon: Garlan Fair, MD;  [facility];  Service: Endoscopy;  Laterality: N/A;  . CYSTOSCOPY WITH RETROGRADE PYELOGRAM, URETEROSCOPY AND STENT PLACEMENT  Right 01/31/2016   Procedure: CYSTOSCOPY WITH RETROGRADE PYELOGRAM, URETEROSCOPY AND STENT PLACEMENT;  Surgeon: [name], MD;  [facility];  Service: Urology;  Laterality: Right;  . CYSTOSCOPY WITH URETHRAL DILATATION N/A 01/31/2016   Procedure: CYSTOSCOPY WITH URETHRAL BALLOON DILATATION;  Surgeon: [name], MD;  Location: [facility];  Service: Urology;  Laterality: N/A;  . CYSTOSCOPY/URETEROSCOPY/HOLMIUM LASER/STENT PLACEMENT Bilateral 04/14/2016   Procedure: CYSTOSCOPY/URETEROSCOPY   RETROGRADE PYELOGRAM;  Surgeon: [name], MD;  Location: [facility];  Service: Urology;  Laterality: Bilateral;  . HERNIA REPAIR     abdominal  . HOLMIUM LASER APPLICATION Right 10/31/2261   Procedure: HOLMIUM LASER APPLICATION;  Surgeon: [name], MD;  Location: [facility];   Service: Urology;  Laterality: Right;  . JOINT REPLACEMENT Left   . KNEE SURGERY Left   . LAPAROSCOPIC GASTRIC BANDING    . LOOP RECORDER IMPLANT N/A 07/12/2014   Procedure: LOOP RECORDER IMPLANT;  Surgeon: Evans Lance, MD;  Location: [facility] CATH LAB;  Service: Cardiovascular;  Laterality: N/A;  . TARSAL TUNNEL RELEASE Bilateral 2005  . TEE WITHOUT CARDIOVERSION N/A 06/14/2014   Procedure: TRANSESOPHAGEAL ECHOCARDIOGRAM (TEE);  Surgeon: Sueanne Margarita, MD;  Location: [facility] ENDOSCOPY;  Service: Cardiovascular;  Laterality: N/A;  . TONSILLECTOMY    . TOTAL KNEE ARTHROPLASTY Right 06/07/2014   Procedure: TOTAL KNEE ARTHROPLASTY;  Surgeon: Kerin Salen, MD;  Location: [place];  Service: Orthopedics;  Laterality: Right;           Family History  Problem Relation Age of Onset  . AAA (abdominal aortic aneurysm) Mother   . Hypertension Mother   . Alzheimer's disease Father   . Cirrhosis Brother     of the liver  . AAA (abdominal aortic aneurysm) Brother   . Hypertension Brother   . Heart attack Neg Hx      Social History        Social History  . Marital status: Married    Spouse name: [name]  . Number of children: 3  . Years of education: Master's       Occupational History  . [employer]          Social History Main Topics  . Smoking status: Never Smoker  . Smokeless tobacco: Never Used  . Alcohol use 0.0 oz/week     Comment: rare 1 PER month  . Drug use: No  . Sexual activity: Not on file       Other Topics Concern  . Not on file      Social History Narrative   Lives at home with wife.   Left handed.    Caffeine use: Drinks 1 glass coffee, tea, soda per day. Varies.     BP 112/74   Pulse 64   Ht 5\' 10"  (1.778 m)   Wt 259 lb (117.5 kg)   SpO2 96%   BMI 37.16 kg/m   Physical Exam:  Well appearing overweight 71 year old man, NAD HEENT: Unremarkable Neck:  6 cm JVD, no thyromegally Back:  No CVA tenderness Lungs:  Clear,  with no wheezes, rales, or rhonchi.  HEART:  Regular rate rhythm, no murmurs, no rubs, no clicks Abd:  soft, positive bowel sounds, no organomegally, no rebound, no guarding Ext:  2 plus pulses, no edema, no cyanosis, no clubbing Skin:  No rashes no nodules Neuro:  CN II through XII intact, motor grossly intact   Assess/Plan: 1. Cryptogenic stroke - he has had no recurrent symptoms. Will remove his ILR next week  at his preference. 2. Atrial fib - he is asymptomatic with no recurrent atrial fib. Will follow. 3. HTN - his blood pressure is well controlled. No change in meds.  4. Obesity - he is encouraged to lose weight.  Ponciano Ort.   EP Attending  Patient seen and examined. No change since last clinic visit. We will proceed with ILR removal today.  Mikle Bosworth.D.

## 2016-08-09 LAB — CUP PACEART REMOTE DEVICE CHECK
MDC IDC PG IMPLANT DT: 20160316
MDC IDC SESS DTM: 20180405214311

## 2016-08-09 NOTE — Progress Notes (Signed)
Carelink summary report received. Battery status OK. Normal device function. No new symptom episodes, tachy episodes, brady, or pause episodes. No new AF episodes. Monthly summary reports and ROV/PRN 

## 2016-08-15 IMAGING — CR DG CHEST 2V
2 series · 2 of 2 positions shown · non-contrast
Comparison: [DATE].

CLINICAL DATA: Hypertension.

EXAM:
CHEST  2 VIEW

[w chest pa]
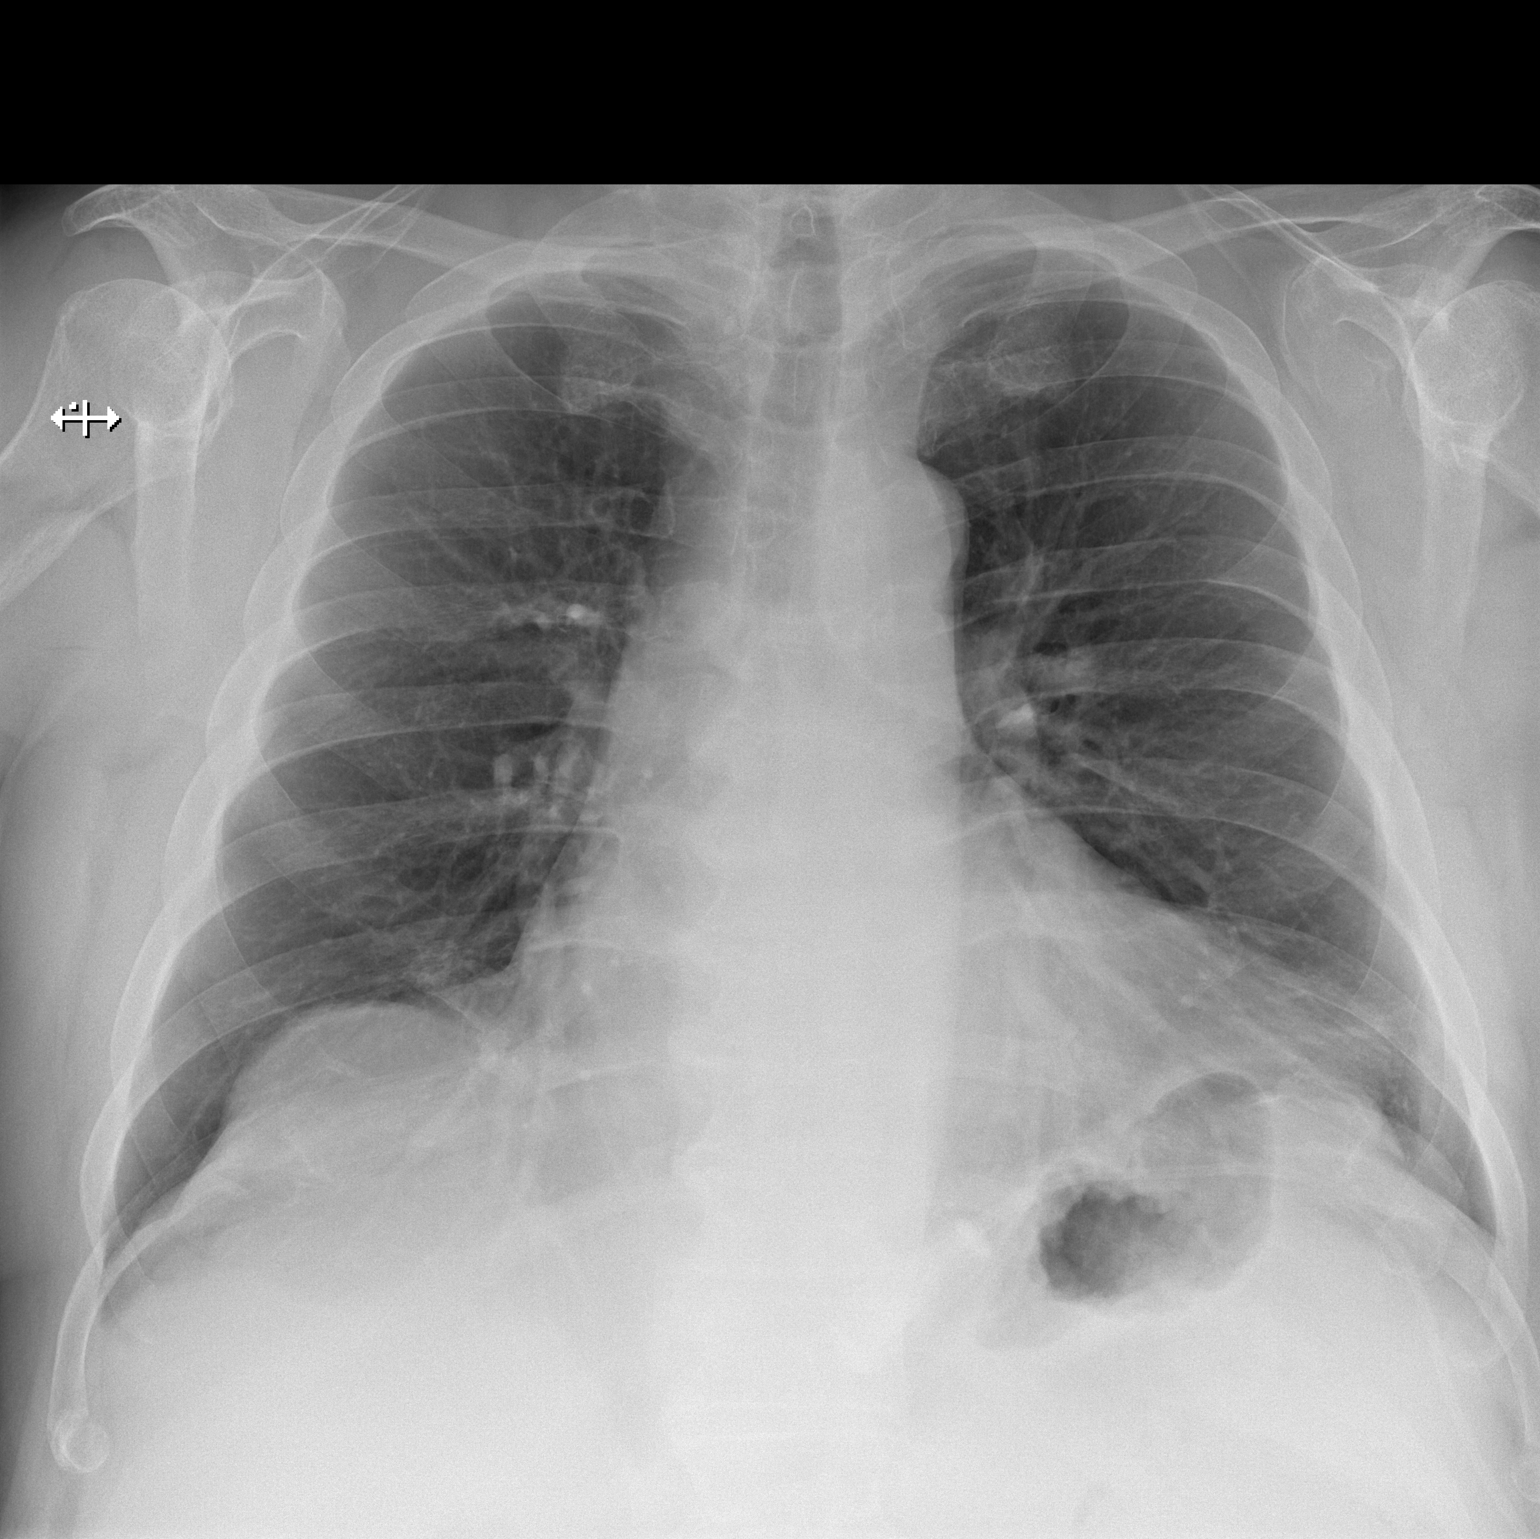

[w chest lat]
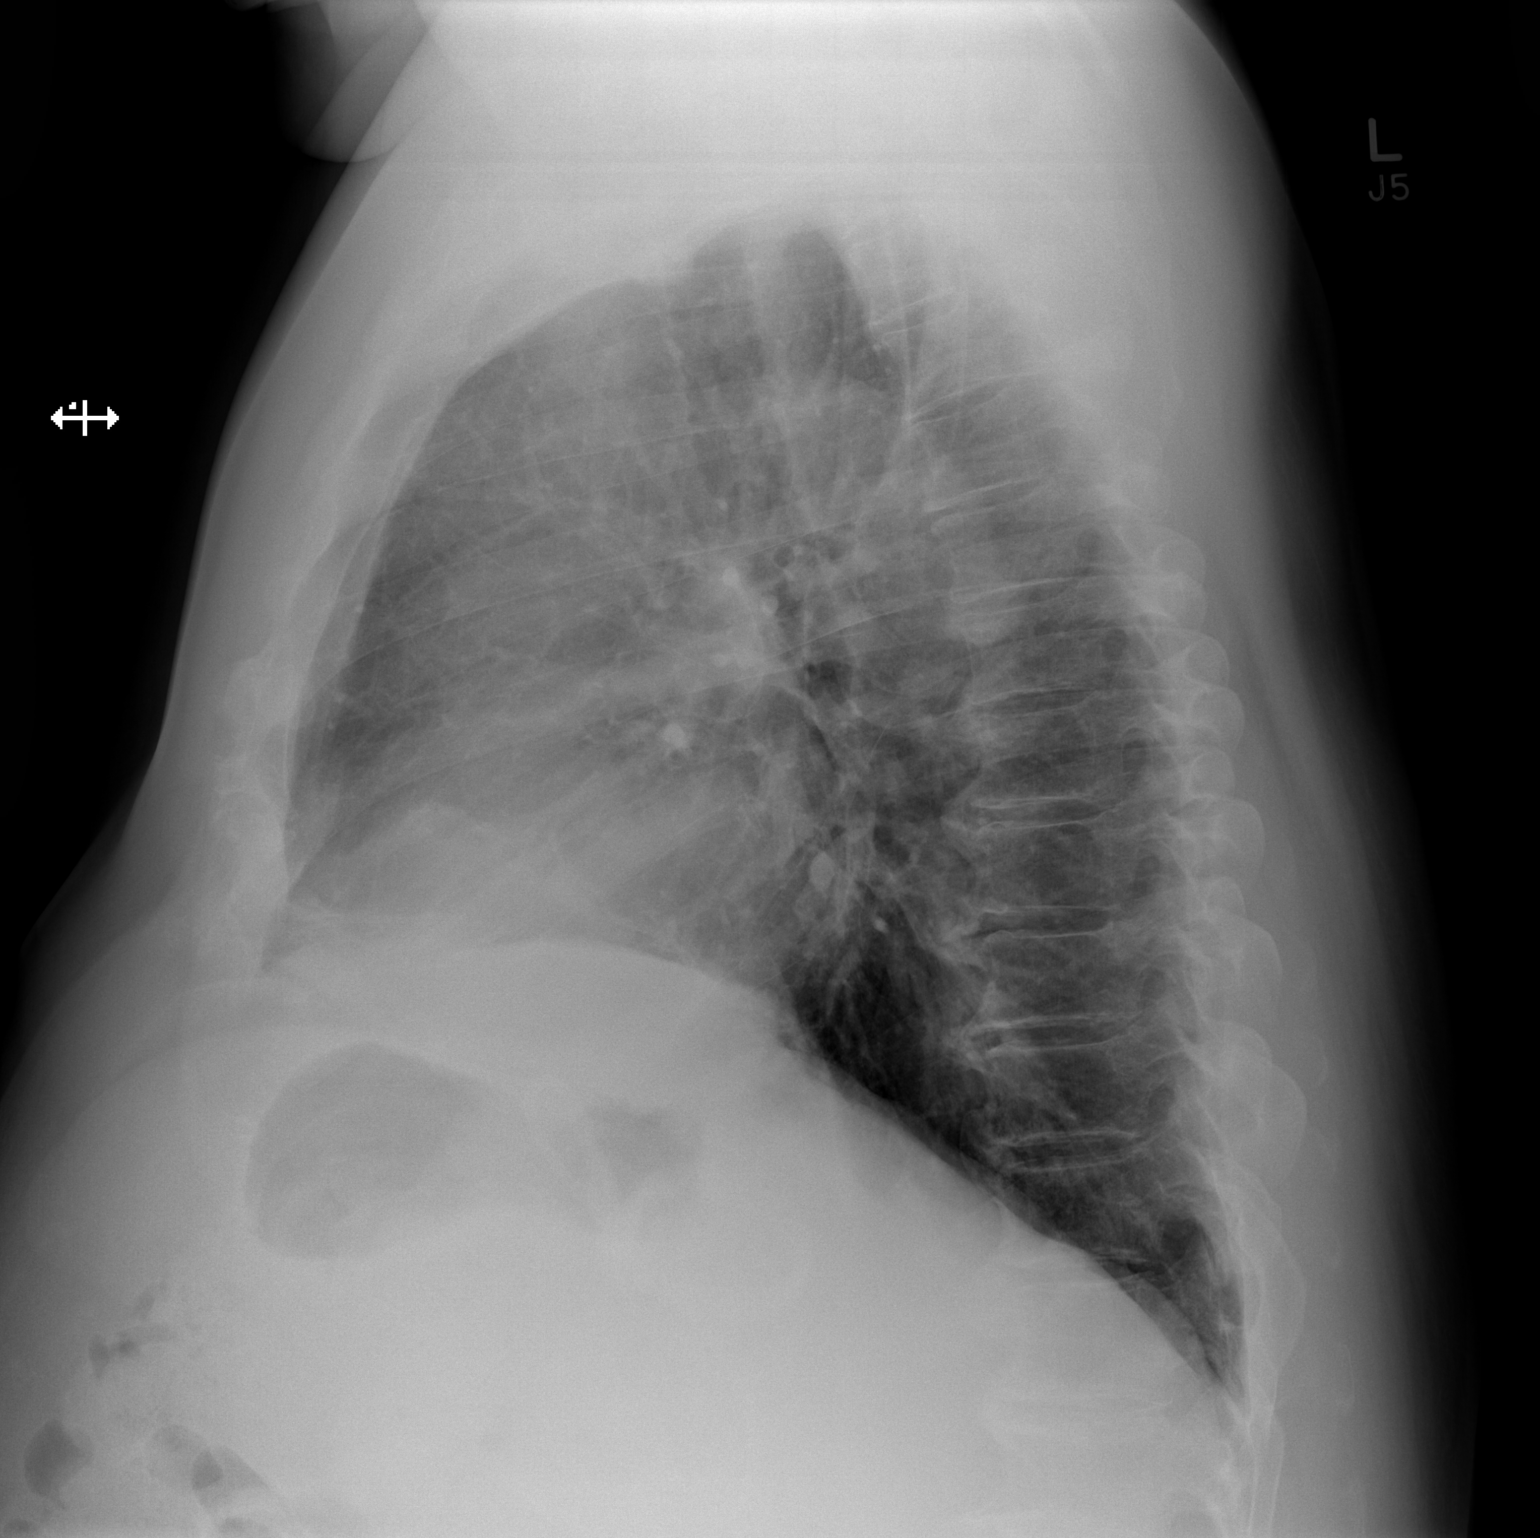

[2 of 2 positions shown; findings below may reference images not displayed]

FINDINGS: Mediastinum and hilar structures are normal. Cardiomegaly with
normal pulmonary vascularity. Mild lingular infiltrate cannot be
excluded. No pleural effusion or pneumothorax. Catheter is noted
over the left upper abdomen.
IMPRESSION: 1. Mild lingular infiltrate cannot be excluded.
2. Stable cardiomegaly .

## 2016-08-20 ENCOUNTER — Ambulatory Visit (INDEPENDENT_AMBULATORY_CARE_PROVIDER_SITE_OTHER): Payer: Self-pay | Admitting: *Deleted

## 2016-08-20 DIAGNOSIS — I4891 Unspecified atrial fibrillation: Secondary | ICD-10-CM

## 2016-08-20 NOTE — Progress Notes (Signed)
Wound check appointment s/p LINQ removal. Steri-strips removed prior to visit. Wound without redness or edema. Incision edges approximated, wound well healed. ROV with GT 06/2017.

## 2016-08-21 ENCOUNTER — Telehealth: Payer: Self-pay | Admitting: Cardiology

## 2016-08-21 DIAGNOSIS — M1812 Unilateral primary osteoarthritis of first carpometacarpal joint, left hand: Secondary | ICD-10-CM | POA: Diagnosis not present

## 2016-08-21 NOTE — Telephone Encounter (Signed)
LMOVM requesting that pt send manual transmission b/c home monitor has not updated in at least 14 days.    

## 2016-09-01 ENCOUNTER — Encounter: Payer: Medicare Other | Admitting: *Deleted

## 2016-09-02 ENCOUNTER — Other Ambulatory Visit (HOSPITAL_COMMUNITY): Payer: Self-pay | Admitting: Urology

## 2016-09-02 DIAGNOSIS — D49512 Neoplasm of unspecified behavior of left kidney: Secondary | ICD-10-CM

## 2016-09-02 DIAGNOSIS — D4102 Neoplasm of uncertain behavior of left kidney: Secondary | ICD-10-CM

## 2016-09-04 ENCOUNTER — Encounter (HOSPITAL_COMMUNITY): Payer: Self-pay

## 2016-09-06 ENCOUNTER — Ambulatory Visit (HOSPITAL_COMMUNITY)
Admission: RE | Admit: 2016-09-06 | Discharge: 2016-09-06 | Disposition: A | Discharge status: Home / Self Care | Payer: Medicare Other | Source: Ambulatory Visit | Attending: Urology | Admitting: Urology

## 2016-09-06 DIAGNOSIS — D4102 Neoplasm of uncertain behavior of left kidney: Secondary | ICD-10-CM

## 2016-09-06 DIAGNOSIS — K760 Fatty (change of) liver, not elsewhere classified: Secondary | ICD-10-CM | POA: Diagnosis not present

## 2016-09-06 DIAGNOSIS — D49512 Neoplasm of unspecified behavior of left kidney: Secondary | ICD-10-CM | POA: Insufficient documentation

## 2016-09-06 DIAGNOSIS — N281 Cyst of kidney, acquired: Secondary | ICD-10-CM | POA: Diagnosis not present

## 2016-09-06 LAB — POCT I-STAT CREATININE: Creatinine, Ser: 1 mg/dL (ref 0.61–1.24)

## 2016-09-06 MED ORDER — GADOBENATE DIMEGLUMINE 529 MG/ML IV SOLN
20.0000 mL | Freq: Once | INTRAVENOUS | Status: AC | PRN
  Administered 2016-09-06: 20 mL via INTRAVENOUS

## 2016-09-07 ENCOUNTER — Other Ambulatory Visit (HOSPITAL_COMMUNITY): Payer: Self-pay | Admitting: Nurse Practitioner

## 2016-09-10 DIAGNOSIS — D49512 Neoplasm of unspecified behavior of left kidney: Secondary | ICD-10-CM | POA: Diagnosis not present

## 2016-09-10 DIAGNOSIS — N200 Calculus of kidney: Secondary | ICD-10-CM | POA: Diagnosis not present

## 2016-09-16 ENCOUNTER — Encounter: Payer: Self-pay | Admitting: Interventional Cardiology

## 2016-09-16 ENCOUNTER — Ambulatory Visit (INDEPENDENT_AMBULATORY_CARE_PROVIDER_SITE_OTHER): Payer: Medicare Other | Admitting: Interventional Cardiology

## 2016-09-16 VITALS — BP 106/70 | HR 66 | Ht 70.0 in | Wt 256.2 lb

## 2016-09-16 DIAGNOSIS — E782 Mixed hyperlipidemia: Secondary | ICD-10-CM

## 2016-09-16 DIAGNOSIS — I119 Hypertensive heart disease without heart failure: Secondary | ICD-10-CM | POA: Diagnosis not present

## 2016-09-16 DIAGNOSIS — I639 Cerebral infarction, unspecified: Secondary | ICD-10-CM

## 2016-09-16 DIAGNOSIS — I4891 Unspecified atrial fibrillation: Secondary | ICD-10-CM | POA: Diagnosis not present

## 2016-09-16 DIAGNOSIS — E118 Type 2 diabetes mellitus with unspecified complications: Secondary | ICD-10-CM | POA: Diagnosis not present

## 2016-09-16 NOTE — Progress Notes (Signed)
Patient ID: Eddie Zimmerman, male   DOB: 1946-03-06, 71 y.o.   MRN: [id number]     Cardiology Office Note   Date:  09/16/2016   ID:  Eddie Zimmerman, DOB [birth year], MRN [id number]  PCP:  [name], MD    No chief complaint on file. PAF   Wt Readings from Last 3 Encounters:  09/16/16 256 lb 3.2 oz (116.2 kg)  08/05/16 250 lb (113.4 kg)  07/23/16 259 lb (117.5 kg)       History of Present Illness: Eddie Zimmerman is a 71 y.o. male with a h/o hyperlipidemia.  He had a cryptogenic stroke with ILR placed 3/16. Brief episode of asymptomatic Afib was noted in 8/16 and pt was referred the Afib clinic to discuss anticoagulation. He was started on xarelto. It is affordable.  No bleeding problems since that time.   LINQ monitor was removed.    No further CVA sx.  A1C has been aggressively managed.[name]    No CP, SHOB, palpitations.  He goes to the gym 3-4x/week and uses the elliptical. HR to 130, no chest pain. He does some weights as well. 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  No NTG use.  H/o bilateral knee replacements.  He had hematuria with kidney stones.  He had surgery in 10/17 and 12/17.  Held Xarelto and tolerated this well.    BP increases by evening to the 937 systolic.    THere as a pancreatic mass found and he will be seeing Dr. Barry Dienes.      Past Medical History:  Diagnosis Date  . Arthritis    hands, wrist & Knee   . Depression   . Diabetes mellitus without complication ([place])   . Dysrhythmia    ? Atrial fibrillation episode" Loop Recorder" implanted left chest- Shadae Reino follows  . Gout   . Hearing impaired person, bilateral    hearing aids used  . History of kidney stones LAST MAY 2015  . Hx of laparoscopic gastric banding    remains in place  . Hypertension   . Sleep apnea    cpap use, SETTING OF 13  . Stroke [facility]) 06-07-2014    Past Surgical History:  Procedure Laterality Date  . ARTHROSCOPIC REPAIR PCL Bilateral    right- x1, left- x3    . COLONOSCOPY WITH PROPOFOL N/A 10/24/2014   Procedure: COLONOSCOPY WITH PROPOFOL;  Surgeon: Garlan Fair, MD;  Location: [facility];  Service: Endoscopy;  Laterality: N/A;  . CYSTOSCOPY WITH RETROGRADE PYELOGRAM, URETEROSCOPY AND STENT PLACEMENT Right 01/31/2016   Procedure: CYSTOSCOPY WITH RETROGRADE PYELOGRAM, URETEROSCOPY AND STENT PLACEMENT;  Surgeon: [name], MD;  Location: [facility];  Service: Urology;  Laterality: Right;  . CYSTOSCOPY WITH URETHRAL DILATATION N/A 01/31/2016   Procedure: CYSTOSCOPY WITH URETHRAL BALLOON DILATATION;  Surgeon: [name], MD;  Location: [facility];  Service: Urology;  Laterality: N/A;  . CYSTOSCOPY/URETEROSCOPY/HOLMIUM LASER/STENT PLACEMENT Bilateral 04/14/2016   Procedure: CYSTOSCOPY/URETEROSCOPY   RETROGRADE PYELOGRAM;  Surgeon: [name], MD;  Location: [facility];  Service: Urology;  Laterality: Bilateral;  . HERNIA REPAIR     abdominal  . HOLMIUM LASER APPLICATION Right 90/05/4095   Procedure: HOLMIUM LASER APPLICATION;  Surgeon: [name], MD;  Location: [facility];  Service: Urology;  Laterality: Right;  . JOINT REPLACEMENT Left   . KNEE SURGERY Left   . LAPAROSCOPIC GASTRIC BANDING    . LOOP RECORDER IMPLANT N/A 07/12/2014   Procedure: LOOP RECORDER IMPLANT;  Surgeon: Carleene Overlie  Peyton Najjar, MD;  Location: [facility] CATH LAB;  Service: Cardiovascular;  Laterality: N/A;  . LOOP RECORDER REMOVAL N/A 08/05/2016   Procedure: Loop Recorder Removal;  Surgeon: Evans Lance, MD;  Location: [facility];  Service: Cardiovascular;  Laterality: N/A;  . TARSAL TUNNEL RELEASE Bilateral 2005  . TEE WITHOUT CARDIOVERSION N/A 06/14/2014   Procedure: TRANSESOPHAGEAL ECHOCARDIOGRAM (TEE);  Surgeon: Sueanne Margarita, MD;  Location: [facility] ENDOSCOPY;  Service: Cardiovascular;  Laterality: N/A;  . TONSILLECTOMY    . TOTAL KNEE ARTHROPLASTY Right 06/07/2014   Procedure: TOTAL KNEE ARTHROPLASTY;  Surgeon: Kerin Salen, MD;  Location: [place];  Service: Orthopedics;  Laterality:  Right;     Current Outpatient Prescriptions  Medication Sig Dispense Refill  . allopurinol (ZYLOPRIM) 300 MG tablet Take 300 mg by mouth daily with lunch.     [name] amoxicillin (AMOXIL) 500 MG capsule Take 2,000 mg by mouth See admin instructions. Take 4 capsules (2000 mg) by mouth one hour prior to dental procedure - last procedure 01/15/16    . B Complex-C (SUPER B COMPLEX PO) Take 1 tablet by mouth daily with lunch.    . Coenzyme Q10 300 MG CAPS Take 300 mg by mouth daily with lunch.    . escitalopram (LEXAPRO) 10 MG tablet Take 10 mg by mouth daily with lunch.     . hydrochlorothiazide (MICROZIDE) 12.5 MG capsule Take 12.5 mg by mouth daily with lunch.    . lisinopril (PRINIVIL,ZESTRIL) 40 MG tablet Take 1/2 tablet (20 mg) twice daily 60 tablet 6  . Multiple Vitamin (MULTIVITAMIN WITH MINERALS) TABS tablet Take 1 tablet by mouth daily with lunch. Men's One a Day    . pioglitazone (ACTOS) 15 MG tablet Take 15 mg by mouth daily with lunch.     . Polyvinyl Alcohol-Povidone (CLEAR EYES NATURAL TEARS OP) Place 1 drop into both eyes 2 (two) times daily as needed (dry eyes).    . pregabalin (LYRICA) 75 MG capsule Take 75 mg by mouth daily with lunch.    [name] PRESCRIPTION MEDICATION Inhale into the lungs at bedtime. CPAP    . rosuvastatin (CRESTOR) 5 MG tablet Take 1 tablet (5 mg total) by mouth daily. (Patient taking differently: Take 5 mg by mouth daily with lunch. ) 90 tablet 2  . vitamin B-12 (CYANOCOBALAMIN) 1000 MCG tablet Take 1,000 mcg by mouth daily with lunch.    Alveda Reasons 20 MG TABS tablet TAKE 1 TABLET DAILY WITH SUPPER 90 tablet 1   No current facility-administered medications for this visit.     Allergies:   Patient has no known allergies.    Social History:  The patient  reports that he has never smoked. He has never used smokeless tobacco. He reports that he drinks alcohol. He reports that he does not use drugs.   Family History:  The patient's family history includes AAA (abdominal  aortic aneurysm) in his brother and mother; Alzheimer's disease in his father; Cirrhosis in his brother; Hypertension in his brother and mother.    ROS:  Please see the history of present illness.   Otherwise, review of systems are positive for weight gain. No residual issues after the stroke.   No CP.  All other systems are reviewed and negative.    PHYSICAL EXAM: VS:  BP 106/70 (BP Location: Left Arm, Patient Position: Sitting, Cuff Size: Large)   Pulse 66   Ht 5\' 10"  (1.778 m)   Wt 256 lb 3.2 oz (116.2 kg)  SpO2 96%   BMI 36.76 kg/m  , BMI Body mass index is 36.76 kg/m. GEN: Well nourished, well developed, in no acute distress  HEENT: normal  Neck: no JVD, carotid bruits, or masses Cardiac: RRR; no murmurs, rubs, or gallops,no edema  Respiratory:  clear to auscultation bilaterally, normal work of breathing GI: soft, nontender, nondistended,  MS: no deformity or atrophy  Skin: warm and dry, no rash Neuro:  Strength and sensation are intact Psych: euthymic mood, full affect      Recent Labs: 04/02/2016: ALT 29; BUN 16; Potassium 4.4; Sodium 140 04/11/2016: Hemoglobin 15.4; Platelets 209 09/06/2016: Creatinine, Ser 1.00   Lipid Panel    Component Value Date/Time   CHOL 161 04/02/2016 0917   TRIG 179 (H) 04/02/2016 0917   HDL 36 (L) 04/02/2016 0917   CHOLHDL 4.5 04/02/2016 0917   VLDL 36 (H) 04/02/2016 0917   LDLCALC 89 04/02/2016 0917     Other studies Reviewed: Additional studies/ records that were reviewed today with results demonstrating: Hospital records from the time of his stroke reviewed.   ASSESSMENT AND PLAN:  1. PAF: Maintianing NSR at this time.  LINQ removed.  Xarelto for anticoagulation for stroke prevention.   Tolerating the elliptical well. 2. Hypertensive heart disease: Well controlled today. Increased lisinopril to 20 mg BID. Has not checked at home recently.  Continue regular exercise.  3. Hyperlipidemia:  Well-controlled in December 2017.   4. DM: Well-controlled. Managed by primary care physician.  He will have labs soon with Dr. Delfina Redwood. 5. Obesity: He had lap band surgery in 2015.  Weight has plateaued in this range.  Continue to try to exercise to help with further weight loss.  He admits to eating too many sweets. 6. Negative stress in 2010.  No sx with exercise.  ECG abnormal but unchanged.  Hold of on ischemic testing.  Baseline abnormal ECG for several years.   Current medicines are reviewed at length with the patient today.  The patient concerns regarding his medicines were addressed.  The following changes have been made:  none  Labs/ tests ordered today include:  No orders of the defined types were placed in this encounter.   Recommend 150 minutes/week of aerobic exercise Low fat, low carb, high fiber diet recommended  Disposition:   FU in 6 months   Signed, Larae Grooms, MD  09/16/2016 10:10 AM    [facility] [place], [place], [place]  [address] Phone: [phone]; Fax: [phone]

## 2016-09-16 NOTE — Patient Instructions (Signed)

## 2016-10-09 DIAGNOSIS — I480 Paroxysmal atrial fibrillation: Secondary | ICD-10-CM | POA: Diagnosis not present

## 2016-10-09 DIAGNOSIS — E119 Type 2 diabetes mellitus without complications: Secondary | ICD-10-CM | POA: Diagnosis not present

## 2016-10-09 DIAGNOSIS — E7800 Pure hypercholesterolemia, unspecified: Secondary | ICD-10-CM | POA: Diagnosis not present

## 2016-10-09 DIAGNOSIS — Z0000 Encounter for general adult medical examination without abnormal findings: Secondary | ICD-10-CM | POA: Diagnosis not present

## 2016-10-09 DIAGNOSIS — I639 Cerebral infarction, unspecified: Secondary | ICD-10-CM | POA: Diagnosis not present

## 2016-10-09 DIAGNOSIS — I10 Essential (primary) hypertension: Secondary | ICD-10-CM | POA: Diagnosis not present

## 2016-10-09 DIAGNOSIS — Z1389 Encounter for screening for other disorder: Secondary | ICD-10-CM | POA: Diagnosis not present

## 2016-10-20 DIAGNOSIS — Z9884 Bariatric surgery status: Secondary | ICD-10-CM | POA: Diagnosis not present

## 2016-10-20 DIAGNOSIS — K862 Cyst of pancreas: Secondary | ICD-10-CM | POA: Diagnosis not present

## 2016-11-21 DIAGNOSIS — Z4651 Encounter for fitting and adjustment of gastric lap band: Secondary | ICD-10-CM | POA: Diagnosis not present

## 2016-12-02 DIAGNOSIS — H0520 Unspecified exophthalmos: Secondary | ICD-10-CM | POA: Diagnosis not present

## 2016-12-02 DIAGNOSIS — H11441 Conjunctival cysts, right eye: Secondary | ICD-10-CM | POA: Diagnosis not present

## 2016-12-02 DIAGNOSIS — E119 Type 2 diabetes mellitus without complications: Secondary | ICD-10-CM | POA: Diagnosis not present

## 2016-12-02 DIAGNOSIS — H524 Presbyopia: Secondary | ICD-10-CM | POA: Diagnosis not present

## 2016-12-02 DIAGNOSIS — H25813 Combined forms of age-related cataract, bilateral: Secondary | ICD-10-CM | POA: Diagnosis not present

## 2016-12-02 DIAGNOSIS — H52223 Regular astigmatism, bilateral: Secondary | ICD-10-CM | POA: Diagnosis not present

## 2016-12-02 DIAGNOSIS — H5213 Myopia, bilateral: Secondary | ICD-10-CM | POA: Diagnosis not present

## 2016-12-25 DIAGNOSIS — M545 Low back pain: Secondary | ICD-10-CM | POA: Diagnosis not present

## 2017-01-27 DIAGNOSIS — Z23 Encounter for immunization: Secondary | ICD-10-CM | POA: Diagnosis not present

## 2017-02-26 ENCOUNTER — Other Ambulatory Visit: Payer: Self-pay | Admitting: General Surgery

## 2017-02-26 DIAGNOSIS — K862 Cyst of pancreas: Secondary | ICD-10-CM

## 2017-03-06 ENCOUNTER — Encounter: Payer: Self-pay | Admitting: Interventional Cardiology

## 2017-03-08 ENCOUNTER — Other Ambulatory Visit (HOSPITAL_COMMUNITY): Payer: Self-pay | Admitting: Interventional Cardiology

## 2017-03-09 NOTE — Telephone Encounter (Signed)
Xarelto 20mg  refill request received; pt is 71 yrs old, wt-116.2kg, Crea-1.00 on 09/08/16, last seen by Dr. Irish Lack on 09/16/16; CrCl-114.62ml/min. Will send in refill request to requested Pharmacy.

## 2017-03-14 NOTE — Progress Notes (Signed)
Cardiology Office Note   Date:  03/17/2017   ID:  Eddie Zimmerman, DOB [birth year], MRN [id number]  PCP:  [name], MD    No chief complaint on file.  AFib  Wt Readings from Last 3 Encounters:  03/17/17 243 lb 12.8 oz (110.6 kg)  09/16/16 256 lb 3.2 oz (116.2 kg)  08/05/16 250 lb (113.4 kg)       History of Present Illness: Eddie Zimmerman is a 71 y.o. male  with a h/o hyperlipidemia.  He has has OSA. He had lap band surgery in the past. He had a cryptogenic stroke with ILR placed 3/16. Brief episode of asymptomatic Afib was noted in 8/16 and pt was referred the Afib clinic to discuss anticoagulation. He was started on xarelto due to the strokes.  His LINQ monitor was removed in 2018.    Denies : Chest pain. Dizziness. Leg edema. Nitroglycerin use. Orthopnea. Palpitations. Paroxysmal nocturnal dyspnea. Shortness of breath. Syncope.     He is walking some, more the elliptical 25-30 minutes a few times a week.  He does some weights as well, so he gets over 150 minutes aweek.    He goes on gambling trips as well. He stays active.    Past Medical History:  Diagnosis Date  . Arthritis    hands, wrist & Knee   . Depression   . Diabetes mellitus without complication ([place])   . Dysrhythmia    ? Atrial fibrillation episode" Loop Recorder" implanted left chest- Claris Guymon follows  . Gout   . Hearing impaired person, bilateral    hearing aids used  . History of kidney stones LAST MAY 2015  . Hx of laparoscopic gastric banding    remains in place  . Hypertension   . Sleep apnea    cpap use, SETTING OF 13  . Stroke [facility]) 06-07-2014    Past Surgical History:  Procedure Laterality Date  . ARTHROSCOPIC REPAIR PCL Bilateral    right- x1, left- x3   . COLONOSCOPY WITH PROPOFOL N/A 10/24/2014   Performed by Garlan Fair, MD at [facility]  . CYSTOSCOPY WITH RETROGRADE PYELOGRAM, URETEROSCOPY AND STENT PLACEMENT Right 01/31/2016   Performed by [name], MD at  [facility] ORS  . CYSTOSCOPY WITH URETHRAL BALLOON DILATATION N/A 01/31/2016   Performed by [name], MD at [facility] ORS  . CYSTOSCOPY/URETEROSCOPY   RETROGRADE PYELOGRAM Bilateral 04/14/2016   Performed by [name], MD at [facility] ORS  . HERNIA REPAIR     abdominal  . HOLMIUM LASER APPLICATION Right 31/08/1759   Performed by [name], MD at [facility] ORS  . JOINT REPLACEMENT Left   . KNEE SURGERY Left   . LAPAROSCOPIC GASTRIC BANDING    . LOOP RECORDER IMPLANT N/A 07/12/2014   Performed by Evans Lance, MD at [facility] CATH LAB  . Loop Recorder Removal N/A 08/05/2016   Performed by Evans Lance, MD at [facility] CV LAB  . TARSAL TUNNEL RELEASE Bilateral 2005  . TONSILLECTOMY    . TOTAL KNEE ARTHROPLASTY Right 06/07/2014   Performed by Kerin Salen, MD at [facility]  . TRANSESOPHAGEAL ECHOCARDIOGRAM (TEE) N/A 06/14/2014   Performed by Sueanne Margarita, MD at [facility] ENDOSCOPY     Current Outpatient Medications  Medication Sig Dispense Refill  . allopurinol (ZYLOPRIM) 300 MG tablet Take 300 mg by mouth daily with lunch.     [name] amoxicillin (AMOXIL) 500 MG capsule Take 2,000 mg by mouth See admin  instructions. Take 4 capsules (2000 mg) by mouth one hour prior to dental procedure - last procedure 01/15/16    . B Complex-C (SUPER B COMPLEX PO) Take 1 tablet by mouth daily with lunch.    . Coenzyme Q10 300 MG CAPS Take 300 mg by mouth daily with lunch.    . escitalopram (LEXAPRO) 10 MG tablet Take 10 mg by mouth daily with lunch.     . hydrochlorothiazide (MICROZIDE) 12.5 MG capsule Take 12.5 mg by mouth daily with lunch.    . lisinopril (PRINIVIL,ZESTRIL) 40 MG tablet Take 1/2 tablet (20 mg) twice daily 60 tablet 6  . meloxicam (MOBIC) 15 MG tablet Take 15 mg by mouth as needed.    . metFORMIN (GLUCOPHAGE) 500 MG tablet Take 1 tablet by mouth 2 (two) times daily.    . Multiple Vitamin (MULTIVITAMIN WITH MINERALS) TABS tablet Take 1 tablet by mouth daily with lunch. Men's One a Day    . Polyvinyl  Alcohol-Povidone (CLEAR EYES NATURAL TEARS OP) Place 1 drop into both eyes 2 (two) times daily as needed (dry eyes).    . pregabalin (LYRICA) 75 MG capsule Take 75 mg by mouth daily with lunch.    [name] PRESCRIPTION MEDICATION Inhale into the lungs at bedtime. CPAP    . rosuvastatin (CRESTOR) 5 MG tablet Take 1 tablet (5 mg total) by mouth daily. (Patient taking differently: Take 5 mg by mouth daily with lunch. ) 90 tablet 2  . XARELTO 20 MG TABS tablet TAKE 1 TABLET DAILY WITH SUPPER 90 tablet 2   No current facility-administered medications for this visit.     Allergies:   Patient has no known allergies.    Social History:  The patient  reports that  has never smoked. he has never used smokeless tobacco. He reports that he drinks alcohol. He reports that he does not use drugs.   Family History:  The patient's family history includes AAA (abdominal aortic aneurysm) in his brother and mother; Alzheimer's disease in his father; Cirrhosis in his brother; Hypertension in his brother and mother.    ROS:  Please see the history of present illness.   Otherwise, review of systems are positive for weight loss, intentional.   All other systems are reviewed and negative.    PHYSICAL EXAM: VS:  BP 114/66   Pulse 64   Ht 5\' 10"  (1.778 m)   Wt 243 lb 12.8 oz (110.6 kg)   SpO2 91%   BMI 34.98 kg/m  , BMI Body mass index is 34.98 kg/m. GEN: Well nourished, well developed, in no acute distress  HEENT: normal  Neck: no JVD, carotid bruits, or masses Cardiac: RRR; no murmurs, rubs, or gallops,no edema  Respiratory:  clear to auscultation bilaterally, normal work of breathing GI: soft, nontender, nondistended, + BS MS: no deformity or atrophy  Skin: warm and dry, no rash Neuro:  Strength and sensation are intact Psych: euthymic mood, full affect   EKG:   The ekg ordered today demonstrates NSR, anterior T wave changes are less pronounced today compared to 17-Aug-2015   Recent Labs: 04/02/2016: ALT 29;  BUN 16; Potassium 4.4; Sodium 140 04/11/2016: Hemoglobin 15.4; Platelets 209 09/06/2016: Creatinine, Ser 1.00   Lipid Panel    Component Value Date/Time   CHOL 161 04/02/2016 0917   TRIG 179 (H) 04/02/2016 0917   HDL 36 (L) 04/02/2016 0917   CHOLHDL 4.5 04/02/2016 0917   VLDL 36 (H) 04/02/2016 0917   LDLCALC 89 04/02/2016 3710  Other studies Reviewed: Additional studies/ records that were reviewed today with results demonstrating: Labs reviewed.   ASSESSMENT AND PLAN:  1. PAF: Xarelto for anticoagulation, stroke prevention. No sx of AFib.  No bleeding issues.  2. Hypertensive heart disease: BP controlled. COntinue current meds.  3. Hyperlipidemia: COntinue lipid lowering therapy.  Lipids controlled.  4. DM: COntinue healthy eating and regular activity.  A1C 8.4, above target. 5. Obesity: 2015 weight loss surgery.  Try to lose 13 lbs in 6 months.  6. Negative stress test in 2010.    Current medicines are reviewed at length with the patient today.  The patient concerns regarding his medicines were addressed.  The following changes have been made:  No change  Labs/ tests ordered today include:  No orders of the defined types were placed in this encounter.   Recommend 150 minutes/week of aerobic exercise Low fat, low carb, high fiber diet recommended  Disposition:   FU in 6 months   Signed, Larae Grooms, MD  03/17/2017 10:40 AM    [facility] [place], [place], [place]  [address] Phone: [phone]; Fax: [phone]

## 2017-03-17 ENCOUNTER — Encounter: Payer: Self-pay | Admitting: Interventional Cardiology

## 2017-03-17 ENCOUNTER — Ambulatory Visit (INDEPENDENT_AMBULATORY_CARE_PROVIDER_SITE_OTHER): Payer: Medicare Other | Admitting: Interventional Cardiology

## 2017-03-17 VITALS — BP 114/66 | HR 64 | Ht 70.0 in | Wt 243.8 lb

## 2017-03-17 DIAGNOSIS — E118 Type 2 diabetes mellitus with unspecified complications: Secondary | ICD-10-CM | POA: Diagnosis not present

## 2017-03-17 DIAGNOSIS — E782 Mixed hyperlipidemia: Secondary | ICD-10-CM | POA: Diagnosis not present

## 2017-03-17 DIAGNOSIS — I639 Cerebral infarction, unspecified: Secondary | ICD-10-CM | POA: Diagnosis not present

## 2017-03-17 DIAGNOSIS — I119 Hypertensive heart disease without heart failure: Secondary | ICD-10-CM

## 2017-03-17 DIAGNOSIS — I480 Paroxysmal atrial fibrillation: Secondary | ICD-10-CM

## 2017-03-17 DIAGNOSIS — E669 Obesity, unspecified: Secondary | ICD-10-CM

## 2017-03-17 NOTE — Patient Instructions (Signed)

## 2017-03-25 DIAGNOSIS — Z79899 Other long term (current) drug therapy: Secondary | ICD-10-CM | POA: Diagnosis not present

## 2017-03-25 DIAGNOSIS — H2513 Age-related nuclear cataract, bilateral: Secondary | ICD-10-CM | POA: Diagnosis not present

## 2017-03-25 DIAGNOSIS — I10 Essential (primary) hypertension: Secondary | ICD-10-CM | POA: Diagnosis not present

## 2017-03-25 DIAGNOSIS — H2181 Floppy iris syndrome: Secondary | ICD-10-CM | POA: Diagnosis not present

## 2017-03-25 DIAGNOSIS — E119 Type 2 diabetes mellitus without complications: Secondary | ICD-10-CM | POA: Diagnosis not present

## 2017-03-25 DIAGNOSIS — H0236 Blepharochalasis left eye, unspecified eyelid: Secondary | ICD-10-CM | POA: Diagnosis not present

## 2017-03-25 DIAGNOSIS — H16143 Punctate keratitis, bilateral: Secondary | ICD-10-CM | POA: Diagnosis not present

## 2017-03-25 DIAGNOSIS — E1136 Type 2 diabetes mellitus with diabetic cataract: Secondary | ICD-10-CM | POA: Diagnosis not present

## 2017-03-25 DIAGNOSIS — H0259 Other disorders affecting eyelid function: Secondary | ICD-10-CM | POA: Diagnosis not present

## 2017-04-13 ENCOUNTER — Other Ambulatory Visit: Payer: Self-pay | Admitting: Interventional Cardiology

## 2017-04-25 ENCOUNTER — Other Ambulatory Visit: Payer: Self-pay | Admitting: Interventional Cardiology

## 2017-05-26 DIAGNOSIS — E7800 Pure hypercholesterolemia, unspecified: Secondary | ICD-10-CM | POA: Diagnosis not present

## 2017-05-26 DIAGNOSIS — I480 Paroxysmal atrial fibrillation: Secondary | ICD-10-CM | POA: Diagnosis not present

## 2017-05-26 DIAGNOSIS — Z7984 Long term (current) use of oral hypoglycemic drugs: Secondary | ICD-10-CM | POA: Diagnosis not present

## 2017-05-26 DIAGNOSIS — I639 Cerebral infarction, unspecified: Secondary | ICD-10-CM | POA: Diagnosis not present

## 2017-05-26 DIAGNOSIS — E1140 Type 2 diabetes mellitus with diabetic neuropathy, unspecified: Secondary | ICD-10-CM | POA: Diagnosis not present

## 2017-05-26 DIAGNOSIS — I10 Essential (primary) hypertension: Secondary | ICD-10-CM | POA: Diagnosis not present

## 2017-07-30 DIAGNOSIS — N200 Calculus of kidney: Secondary | ICD-10-CM | POA: Diagnosis not present

## 2017-08-13 DIAGNOSIS — E1165 Type 2 diabetes mellitus with hyperglycemia: Secondary | ICD-10-CM | POA: Diagnosis not present

## 2017-08-20 DIAGNOSIS — M25531 Pain in right wrist: Secondary | ICD-10-CM | POA: Diagnosis not present

## 2017-09-08 DIAGNOSIS — W19XXXA Unspecified fall, initial encounter: Secondary | ICD-10-CM | POA: Diagnosis not present

## 2017-09-08 DIAGNOSIS — S8012XA Contusion of left lower leg, initial encounter: Secondary | ICD-10-CM | POA: Diagnosis not present

## 2017-09-09 DIAGNOSIS — N200 Calculus of kidney: Secondary | ICD-10-CM | POA: Diagnosis not present

## 2017-09-16 ENCOUNTER — Other Ambulatory Visit (HOSPITAL_COMMUNITY): Payer: Self-pay | Admitting: Urology

## 2017-09-16 DIAGNOSIS — D4102 Neoplasm of uncertain behavior of left kidney: Secondary | ICD-10-CM

## 2017-09-16 DIAGNOSIS — D49512 Neoplasm of unspecified behavior of left kidney: Secondary | ICD-10-CM | POA: Diagnosis not present

## 2017-09-16 DIAGNOSIS — N200 Calculus of kidney: Secondary | ICD-10-CM | POA: Diagnosis not present

## 2017-09-24 ENCOUNTER — Encounter (HOSPITAL_COMMUNITY)
Admission: RE | Admit: 2017-09-24 | Discharge: 2017-09-24 | Disposition: A | Discharge status: Home / Self Care | Payer: Medicare Other | Source: Ambulatory Visit | Attending: Urology | Admitting: Urology

## 2017-09-24 DIAGNOSIS — D4102 Neoplasm of uncertain behavior of left kidney: Secondary | ICD-10-CM

## 2017-09-24 DIAGNOSIS — D49512 Neoplasm of unspecified behavior of left kidney: Secondary | ICD-10-CM | POA: Insufficient documentation

## 2017-09-24 DIAGNOSIS — K8689 Other specified diseases of pancreas: Secondary | ICD-10-CM | POA: Diagnosis not present

## 2017-09-24 DIAGNOSIS — N281 Cyst of kidney, acquired: Secondary | ICD-10-CM | POA: Diagnosis not present

## 2017-09-24 MED ORDER — GADOBENATE DIMEGLUMINE 529 MG/ML IV SOLN
20.0000 mL | Freq: Once | INTRAVENOUS | Status: AC | PRN
  Administered 2017-09-24: 20 mL via INTRAVENOUS

## 2017-09-29 ENCOUNTER — Ambulatory Visit: Payer: Medicare Other | Admitting: Interventional Cardiology

## 2017-10-08 DIAGNOSIS — M1A0310 Idiopathic chronic gout, right wrist, without tophus (tophi): Secondary | ICD-10-CM | POA: Diagnosis not present

## 2017-10-08 DIAGNOSIS — M1812 Unilateral primary osteoarthritis of first carpometacarpal joint, left hand: Secondary | ICD-10-CM | POA: Diagnosis not present

## 2017-11-06 DIAGNOSIS — Z0000 Encounter for general adult medical examination without abnormal findings: Secondary | ICD-10-CM | POA: Diagnosis not present

## 2017-11-06 DIAGNOSIS — Z1389 Encounter for screening for other disorder: Secondary | ICD-10-CM | POA: Diagnosis not present

## 2017-11-19 DIAGNOSIS — M109 Gout, unspecified: Secondary | ICD-10-CM | POA: Diagnosis not present

## 2017-11-30 DIAGNOSIS — Z6836 Body mass index (BMI) 36.0-36.9, adult: Secondary | ICD-10-CM | POA: Diagnosis not present

## 2017-11-30 DIAGNOSIS — M150 Primary generalized (osteo)arthritis: Secondary | ICD-10-CM | POA: Diagnosis not present

## 2017-11-30 DIAGNOSIS — E669 Obesity, unspecified: Secondary | ICD-10-CM | POA: Diagnosis not present

## 2017-11-30 DIAGNOSIS — R5383 Other fatigue: Secondary | ICD-10-CM | POA: Diagnosis not present

## 2017-11-30 DIAGNOSIS — M1009 Idiopathic gout, multiple sites: Secondary | ICD-10-CM | POA: Diagnosis not present

## 2017-11-30 DIAGNOSIS — M7989 Other specified soft tissue disorders: Secondary | ICD-10-CM | POA: Diagnosis not present

## 2017-11-30 DIAGNOSIS — M25531 Pain in right wrist: Secondary | ICD-10-CM | POA: Diagnosis not present

## 2017-11-30 DIAGNOSIS — M064 Inflammatory polyarthropathy: Secondary | ICD-10-CM | POA: Diagnosis not present

## 2017-12-02 ENCOUNTER — Other Ambulatory Visit: Payer: Self-pay | Admitting: Interventional Cardiology

## 2017-12-02 NOTE — Telephone Encounter (Signed)
Xarelto 20mg  refill request received; pt is 72 yrs old, Wt-110.6kg, Crea-1.20 on [facility] on 09/09/17, last seen by Dr. Irish Lack on 03/17/17, CrCl-90.43ml/min; will send in refill to requested pharmacy.

## 2017-12-04 DIAGNOSIS — H0520 Unspecified exophthalmos: Secondary | ICD-10-CM | POA: Diagnosis not present

## 2017-12-04 DIAGNOSIS — H11441 Conjunctival cysts, right eye: Secondary | ICD-10-CM | POA: Diagnosis not present

## 2017-12-04 DIAGNOSIS — H25813 Combined forms of age-related cataract, bilateral: Secondary | ICD-10-CM | POA: Diagnosis not present

## 2017-12-04 DIAGNOSIS — H52223 Regular astigmatism, bilateral: Secondary | ICD-10-CM | POA: Diagnosis not present

## 2017-12-04 DIAGNOSIS — H524 Presbyopia: Secondary | ICD-10-CM | POA: Diagnosis not present

## 2017-12-04 DIAGNOSIS — E119 Type 2 diabetes mellitus without complications: Secondary | ICD-10-CM | POA: Diagnosis not present

## 2017-12-04 DIAGNOSIS — H5213 Myopia, bilateral: Secondary | ICD-10-CM | POA: Diagnosis not present

## 2017-12-15 NOTE — Progress Notes (Addendum)
Cardiology Office Note   Date:  12/16/2017   ID:  Eddie Zimmerman, DOB Aug 29, 1945, MRN [id number]  PCP:  [name], MD    No chief complaint on file.  PAF  Wt Readings from Last 3 Encounters:  12/16/17 251 lb (113.9 kg)  03/17/17 243 lb 12.8 oz (110.6 kg)  09/16/16 256 lb 3.2 oz (116.2 kg)       History of Present Illness: Eddie Zimmerman is a 72 y.o. male   with a h/o hyperlipidemia.  He has has OSA. He had lap band surgery in the past.He had a cryptogenic stroke with ILR placed 3/16. Brief episode of asymptomatic Afib was noted in 8/16 and pt was referred the Afib clinic to discuss anticoagulation. He was started on xarelto due to the strokes.  His LINQ monitor was removed in 2018.    In the past,I noted: "He is walking some, more the elliptical 25-30 minutes a few times a week.  He does some weights as well, so he gets over 150 minutes a week."  Denies : Chest pain. Dizziness. Leg edema. Nitroglycerin use. Orthopnea. Palpitations. Paroxysmal nocturnal dyspnea. Shortness of breath. Syncope.   He goes to the gym and does the elliptical several times a week.  He uses weight machines as well. He tries to eat healthy and decrease sugar intake.   He had some low blood sugars that prompted him to decrease glyburide.    [place] [employer] [place]  He will be suing them himself.   Past Medical History:  Diagnosis Date  . Acute ischemic left MCA stroke ([place]) 08/21/2014  . Aphasia   . Arthritis    hands, wrist & Knee   . Chronic diastolic heart failure ([place]) 06/08/2014  . Cryptogenic stroke ([place]) 06/29/2014  . CVA (cerebral vascular accident) ([place]) 06/14/2014  . Depression   . Diabetes ([place]) 08/21/2014  . Diabetes mellitus without complication ([place])   . Dysrhythmia    ? Atrial fibrillation episode" Loop Recorder" implanted left chest- Shakur Lembo follows  . Essential hypertension, benign 04/07/2013  . Gout   . Hearing  impaired person, bilateral    hearing aids used  . History of kidney stones LAST MAY 2015  . HLD (hyperlipidemia) 08/21/2014  . Hx of laparoscopic gastric banding    remains in place  . Hypertension   . Hypertensive heart disease 03/17/2016  . Hypertrophic obstructive cardiomyopathy ([place]) 04/07/2013  . Obstructive sleep apnea 04/07/2013  . PAF (paroxysmal atrial fibrillation) ([place]) 06/14/2015  . Sleep apnea    cpap use, SETTING OF 13  . Slurred speech 06/08/2014  . Stroke [facility]) 06-07-2014    Past Surgical History:  Procedure Laterality Date  . ARTHROSCOPIC REPAIR PCL Bilateral    right- x1, left- x3   . COLONOSCOPY WITH PROPOFOL N/A 10/24/2014   Procedure: COLONOSCOPY WITH PROPOFOL;  Surgeon: Garlan Fair, MD;  [facility];  Service: Endoscopy;  Laterality: N/A;  . CYSTOSCOPY WITH RETROGRADE PYELOGRAM, URETEROSCOPY AND STENT PLACEMENT Right 01/31/2016   Procedure: CYSTOSCOPY WITH RETROGRADE PYELOGRAM, URETEROSCOPY AND STENT PLACEMENT;  Surgeon: [name], MD;  [facility];  Service: Urology;  Laterality: Right;  . CYSTOSCOPY WITH URETHRAL DILATATION N/A 01/31/2016   Procedure: CYSTOSCOPY WITH URETHRAL BALLOON DILATATION;  Surgeon: [name], MD;  [facility];  Service: Urology;  Laterality: N/A;  . CYSTOSCOPY/URETEROSCOPY/HOLMIUM LASER/STENT PLACEMENT Bilateral 04/14/2016   Procedure: CYSTOSCOPY/URETEROSCOPY   RETROGRADE PYELOGRAM;  Surgeon: [name], MD;  [facility];  Service: Urology;  Laterality: Bilateral;  . HERNIA REPAIR     abdominal  . HOLMIUM LASER APPLICATION Right 16/09/628   Procedure: HOLMIUM LASER APPLICATION;  Surgeon: [name], MD;  [facility];  Service: Urology;  Laterality: Right;  . JOINT REPLACEMENT Left   . KNEE SURGERY Left   . LAPAROSCOPIC GASTRIC BANDING    . LOOP RECORDER IMPLANT N/A 07/12/2014   Procedure: LOOP RECORDER IMPLANT;  Surgeon: Evans Lance, MD;  Location: [facility] CATH LAB;  Service: Cardiovascular;   Laterality: N/A;  . LOOP RECORDER REMOVAL N/A 08/05/2016   Procedure: Loop Recorder Removal;  Surgeon: Evans Lance, MD;  Location: [facility] CV LAB;  Service: Cardiovascular;  Laterality: N/A;  . TARSAL TUNNEL RELEASE Bilateral 2005  . TEE WITHOUT CARDIOVERSION N/A 06/14/2014   Procedure: TRANSESOPHAGEAL ECHOCARDIOGRAM (TEE);  Surgeon: Sueanne Margarita, MD;  Location: [facility] ENDOSCOPY;  Service: Cardiovascular;  Laterality: N/A;  . TONSILLECTOMY    . TOTAL KNEE ARTHROPLASTY Right 06/07/2014   Procedure: TOTAL KNEE ARTHROPLASTY;  Surgeon: Kerin Salen, MD;  Location: [place] [facility];  Service: Orthopedics;  Laterality: Right;     Current Outpatient Medications  Medication Sig Dispense Refill  . allopurinol (ZYLOPRIM) 300 MG tablet Take 300 mg by mouth daily with lunch.     [name] amoxicillin (AMOXIL) 500 MG capsule Take 2,000 mg by mouth See admin instructions. Take 4 capsules (2000 mg) by mouth one hour prior to dental procedure - last procedure 10/01/16    . B Complex-C (SUPER B COMPLEX PO) Take 1 tablet by mouth daily with lunch.    . Coenzyme Q10 300 MG CAPS Take 300 mg by mouth daily with lunch.    . escitalopram (LEXAPRO) 20 MG tablet Take 10 mg by mouth daily with lunch.     . hydrochlorothiazide (MICROZIDE) 12.5 MG capsule Take 12.5 mg by mouth daily with lunch.    . lisinopril (PRINIVIL,ZESTRIL) 40 MG tablet TAKE ONE-HALF (1/2) TABLET TWICE A DAY (INCREASE IN DOSE ON NEXT REFILL) 60 tablet 11  . meloxicam (MOBIC) 15 MG tablet Take 15 mg by mouth as needed.    . metFORMIN (GLUCOPHAGE) 500 MG tablet Take 1 tablet by mouth 2 (two) times daily.    . Multiple Vitamin (MULTIVITAMIN WITH MINERALS) TABS tablet Take 1 tablet by mouth daily with lunch. Men's One a Day    . Polyvinyl Alcohol-Povidone (CLEAR EYES NATURAL TEARS OP) Place 1 drop into both eyes 2 (two) times daily as needed (dry eyes).    . pregabalin (LYRICA) 75 MG capsule Take 75 mg by mouth daily with lunch.    [name] PRESCRIPTION MEDICATION Inhale  into the lungs at bedtime. CPAP    . rosuvastatin (CRESTOR) 5 MG tablet TAKE 1 TABLET DAILY 90 tablet 3  . XARELTO 20 MG TABS tablet TAKE 1 TABLET DAILY WITH SUPPER 90 tablet 1   No current facility-administered medications for this visit.     Allergies:   Patient has no known allergies.    Social History:  The patient  reports that he has never smoked. He has never used smokeless tobacco. He reports that he drinks alcohol. He reports that he does not use drugs.   Family History:  The patient's family history includes AAA (abdominal aortic aneurysm) in his brother and mother; Alzheimer's disease in his father; Cirrhosis in his brother; Hypertension in his brother and mother.    ROS:  Please see the  history of present illness.   Otherwise, review of systems are positive for difficulty losing weight.   All other systems are reviewed and negative.    PHYSICAL EXAM: VS:  BP 110/60   Pulse 71   Ht 5\' 10"  (1.778 m)   Wt 251 lb (113.9 kg)   SpO2 92%   BMI 36.01 kg/m  , BMI Body mass index is 36.01 kg/m. GEN: Well nourished, well developed, in no acute distress  HEENT: normal  Neck: no JVD, carotid bruits, or masses Cardiac: RRR; no murmurs, rubs, or gallops,no edema  Respiratory:  clear to auscultation bilaterally, normal work of breathing GI: soft, nontender, nondistended, + BS; lap band port palpable MS: no deformity or atrophy  Skin: warm and dry, no rash Neuro:  Strength and sensation are intact Psych: euthymic mood, full affect     Recent Labs: No results found for requested labs within last 8760 hours.   Lipid Panel    Component Value Date/Time   CHOL 161 04/02/2016 0917   TRIG 179 (H) 04/02/2016 0917   HDL 36 (L) 04/02/2016 0917   CHOLHDL 4.5 04/02/2016 0917   VLDL 36 (H) 04/02/2016 0917   LDLCALC 89 04/02/2016 0917     Other studies Reviewed: Additional studies/ records that were reviewed today with results demonstrating: labs reviewed.   ASSESSMENT AND  PLAN:  1. PAF: In NSR.  Xarelto for stroke prevention given prior stroke. Needs BMet and CBC every 6 months. 2. Hypertensive heart disease: The current medical regimen is effective;  continue present plan and medications. 3. Hyperlipidemia: 7/19 labs reviewed. The current medical regimen is effective;  continue present plan and medications. 4. DM: A1C 6.9. Continue with trying to eat healthy and exercise as noted below.  Followed by PMD. 5. Obesity: Difficulty losing weight. H/o lap band surgery. 6. Negative stress test in 2010.   Current medicines are reviewed at length with the patient today.  The patient concerns regarding his medicines were addressed.  The following changes have been made:  No change  Labs/ tests ordered today include:  No orders of the defined types were placed in this encounter.   Recommend 150 minutes/week of aerobic exercise Low fat, low carb, high fiber diet recommended  Disposition:   FU in 1 year   Signed, Larae Grooms, MD  12/16/2017 3:47 PM    [facility] [place], [place], [place]  [address] Phone: [phone]; Fax: [phone]

## 2017-12-16 ENCOUNTER — Encounter: Payer: Self-pay | Admitting: Interventional Cardiology

## 2017-12-16 ENCOUNTER — Ambulatory Visit (INDEPENDENT_AMBULATORY_CARE_PROVIDER_SITE_OTHER): Payer: Medicare Other | Admitting: Interventional Cardiology

## 2017-12-16 VITALS — BP 110/60 | HR 71 | Ht 70.0 in | Wt 251.0 lb

## 2017-12-16 DIAGNOSIS — I480 Paroxysmal atrial fibrillation: Secondary | ICD-10-CM

## 2017-12-16 DIAGNOSIS — E669 Obesity, unspecified: Secondary | ICD-10-CM | POA: Diagnosis not present

## 2017-12-16 DIAGNOSIS — I119 Hypertensive heart disease without heart failure: Secondary | ICD-10-CM

## 2017-12-16 DIAGNOSIS — E118 Type 2 diabetes mellitus with unspecified complications: Secondary | ICD-10-CM

## 2017-12-16 DIAGNOSIS — E782 Mixed hyperlipidemia: Secondary | ICD-10-CM | POA: Diagnosis not present

## 2017-12-16 NOTE — Patient Instructions (Signed)

## 2017-12-17 DIAGNOSIS — M150 Primary generalized (osteo)arthritis: Secondary | ICD-10-CM | POA: Diagnosis not present

## 2017-12-17 DIAGNOSIS — M0600 Rheumatoid arthritis without rheumatoid factor, unspecified site: Secondary | ICD-10-CM | POA: Diagnosis not present

## 2017-12-17 DIAGNOSIS — Z6836 Body mass index (BMI) 36.0-36.9, adult: Secondary | ICD-10-CM | POA: Diagnosis not present

## 2017-12-17 DIAGNOSIS — E669 Obesity, unspecified: Secondary | ICD-10-CM | POA: Diagnosis not present

## 2017-12-17 DIAGNOSIS — M1009 Idiopathic gout, multiple sites: Secondary | ICD-10-CM | POA: Diagnosis not present

## 2017-12-21 ENCOUNTER — Emergency Department (HOSPITAL_COMMUNITY)
Admission: EM | Admit: 2017-12-21 | Discharge: 2017-12-21 | Disposition: A | Discharge status: Home / Self Care | Payer: Medicare Other | Attending: Emergency Medicine | Admitting: Emergency Medicine

## 2017-12-21 ENCOUNTER — Emergency Department (HOSPITAL_COMMUNITY): Payer: Medicare Other

## 2017-12-21 ENCOUNTER — Other Ambulatory Visit: Payer: Self-pay

## 2017-12-21 DIAGNOSIS — I110 Hypertensive heart disease with heart failure: Secondary | ICD-10-CM | POA: Diagnosis not present

## 2017-12-21 DIAGNOSIS — I5032 Chronic diastolic (congestive) heart failure: Secondary | ICD-10-CM | POA: Diagnosis not present

## 2017-12-21 DIAGNOSIS — Z7901 Long term (current) use of anticoagulants: Secondary | ICD-10-CM | POA: Diagnosis not present

## 2017-12-21 DIAGNOSIS — M25531 Pain in right wrist: Secondary | ICD-10-CM | POA: Diagnosis not present

## 2017-12-21 DIAGNOSIS — Z96652 Presence of left artificial knee joint: Secondary | ICD-10-CM | POA: Diagnosis not present

## 2017-12-21 DIAGNOSIS — E119 Type 2 diabetes mellitus without complications: Secondary | ICD-10-CM | POA: Diagnosis not present

## 2017-12-21 DIAGNOSIS — Z8673 Personal history of transient ischemic attack (TIA), and cerebral infarction without residual deficits: Secondary | ICD-10-CM | POA: Diagnosis not present

## 2017-12-21 MED ORDER — OXYCODONE-ACETAMINOPHEN 5-325 MG PO TABS: 1.0000 | ORAL_TABLET | Freq: Four times a day (QID) | ORAL | 0 refills | Status: DC | PRN

## 2017-12-21 MED ORDER — PREDNISONE 20 MG PO TABS: ORAL_TABLET | ORAL | 0 refills | Status: DC

## 2017-12-21 MED ORDER — OXYCODONE-ACETAMINOPHEN 5-325 MG PO TABS
1.0000 | ORAL_TABLET | Freq: Once | ORAL | Status: AC
  Administered 2017-12-21: 1 via ORAL
  Filled 2017-12-21: qty 1

## 2017-12-21 NOTE — ED Provider Notes (Signed)
[facility] EMERGENCY DEPARTMENT Provider Note   CSN: [id number] Arrival date & time: 12/21/17  7867     History   Chief Complaint Chief Complaint  Patient presents with  . Wrist Pain    HPI Eddie Zimmerman is a 72 y.o. male.  Patient with history of stroke, atrial fibrillation on Xarelto, diabetes, gouty arthritis --presents with worsening right wrist pain over the past 2 days.  No new injury.  Patient sustained injury after a fall 4 months ago.  Patient followed up with his orthopedic, orthopedic hand surgeon, and rheumatologist for ongoing pain.  He was told that it was gout.  He reports having a recent sonogram of his wrist showing joint swelling.  Patient was started on a new medication of which he cannot remember the name.  He denies any fevers or chills.  No nausea or vomiting.  No erythema.  Pain is made worse with palpation and movement.  He has a history of gout in the other joints.  No history of aspiration of the wrist.  He has been taking tramadol at home which has not helped.  The onset of this condition was acute. The course is constant.      Past Medical History:  Diagnosis Date  . Acute ischemic left MCA stroke ([place]) 08/21/2014  . Aphasia   . Arthritis    hands, wrist & Knee   . Chronic diastolic heart failure ([place]) 06/08/2014  . Cryptogenic stroke ([place]) 06/29/2014  . CVA (cerebral vascular accident) ([place]) 06/14/2014  . Depression   . Diabetes ([place]) 08/21/2014  . Diabetes mellitus without complication ([place])   . Dysrhythmia    ? Atrial fibrillation episode" Loop Recorder" implanted left chest- [place] follows  . Essential hypertension, benign 04/07/2013  . Gout   . Hearing impaired person, bilateral    hearing aids used  . History of kidney stones LAST MAY 2015  . HLD (hyperlipidemia) 08/21/2014  . Hx of laparoscopic gastric banding    remains in place  . Hypertension   . Hypertensive heart disease 03/17/2016  . Hypertrophic obstructive  cardiomyopathy ([place]) 04/07/2013  . Obstructive sleep apnea 04/07/2013  . PAF (paroxysmal atrial fibrillation) ([place]) 06/14/2015  . Sleep apnea    cpap use, SETTING OF 13  . Slurred speech 06/08/2014  . Stroke [facility]) 06-07-2014    Patient Active Problem List   Diagnosis Date Noted  . Hypertensive heart disease 03/17/2016  . PAF (paroxysmal atrial fibrillation) ([place]) 06/14/2015  . Acute ischemic left MCA stroke ([place]) 08/21/2014  . Morbid obesity ([place]) 08/21/2014  . HLD (hyperlipidemia) 08/21/2014  . Diabetes ([place]) 08/21/2014  . Cryptogenic stroke ([place]) 06/29/2014  . CVA (cerebral vascular accident) ([place]) 06/14/2014  . Aphasia   . Chronic diastolic heart failure ([place]) 06/08/2014  . Obesity (BMI 30-39.9) 06/08/2014  . Diabetes mellitus without complication ([place]) 67/20/9470  . Hypertension 06/08/2014  . Slurred speech 06/08/2014  . Primary osteoarthritis of right knee 06/07/2014  . Arthritis of knee 06/07/2014  . Preoperative cardiovascular examination 04/10/2014  . Hypertrophic obstructive cardiomyopathy ([place]) 04/07/2013  . Mixed hyperlipidemia 04/07/2013  . Essential hypertension, benign 04/07/2013  . Obstructive sleep apnea 04/07/2013    Past Surgical History:  Procedure Laterality Date  . ARTHROSCOPIC REPAIR PCL Bilateral    right- x1, left- x3   . COLONOSCOPY WITH PROPOFOL N/A 10/24/2014   Procedure: COLONOSCOPY WITH PROPOFOL;  Surgeon: Garlan Fair, MD;  [facility];  Service: Endoscopy;  Laterality: N/A;  . CYSTOSCOPY  WITH RETROGRADE PYELOGRAM, URETEROSCOPY AND STENT PLACEMENT Right 01/31/2016   Procedure: CYSTOSCOPY WITH RETROGRADE PYELOGRAM, URETEROSCOPY AND STENT PLACEMENT;  Surgeon: [name], MD;  [facility];  Service: Urology;  Laterality: Right;  . CYSTOSCOPY WITH URETHRAL DILATATION N/A 01/31/2016   Procedure: CYSTOSCOPY WITH URETHRAL BALLOON DILATATION;  Surgeon: [name], MD;  [facility];  Service: Urology;  Laterality: N/A;  .  CYSTOSCOPY/URETEROSCOPY/HOLMIUM LASER/STENT PLACEMENT Bilateral 04/14/2016   Procedure: CYSTOSCOPY/URETEROSCOPY   RETROGRADE PYELOGRAM;  Surgeon: [name], MD;  [facility];  Service: Urology;  Laterality: Bilateral;  . HERNIA REPAIR     abdominal  . HOLMIUM LASER APPLICATION Right 85/07/6268   Procedure: HOLMIUM LASER APPLICATION;  Surgeon: [name], MD;  [facility];  Service: Urology;  Laterality: Right;  . JOINT REPLACEMENT Left   . KNEE SURGERY Left   . LAPAROSCOPIC GASTRIC BANDING    . LOOP RECORDER IMPLANT N/A 07/12/2014   Procedure: LOOP RECORDER IMPLANT;  Surgeon: Evans Lance, MD;  Location: [facility] CATH LAB;  Service: Cardiovascular;  Laterality: N/A;  . LOOP RECORDER REMOVAL N/A 08/05/2016   Procedure: Loop Recorder Removal;  Surgeon: Evans Lance, MD;  Location: [facility] CV LAB;  Service: Cardiovascular;  Laterality: N/A;  . TARSAL TUNNEL RELEASE Bilateral 2005  . TEE WITHOUT CARDIOVERSION N/A 06/14/2014   Procedure: TRANSESOPHAGEAL ECHOCARDIOGRAM (TEE);  Surgeon: Sueanne Margarita, MD;  Location: [facility] ENDOSCOPY;  Service: Cardiovascular;  Laterality: N/A;  . TONSILLECTOMY    . TOTAL KNEE ARTHROPLASTY Right 06/07/2014   Procedure: TOTAL KNEE ARTHROPLASTY;  Surgeon: Kerin Salen, MD;  Location: [facility];  Service: Orthopedics;  Laterality: Right;        Home Medications    Prior to Admission medications   Medication Sig Start Date End Date Taking? Authorizing Provider  allopurinol (ZYLOPRIM) 300 MG tablet Take 300 mg by mouth daily with lunch.     [provider]  amoxicillin (AMOXIL) 500 MG capsule Take 2,000 mg by mouth See admin instructions. Take 4 capsules (2000 mg) by mouth one hour prior to dental procedure - last procedure 10/01/16 01/01/16   [provider]  B Complex-C (SUPER B COMPLEX PO) Take 1 tablet by mouth daily with lunch.    [provider]  Coenzyme Q10 300 MG CAPS Take 300 mg by mouth daily with lunch.    [provider]  escitalopram (LEXAPRO) 20 MG tablet Take 10 mg by mouth daily with lunch.     [provider]  hydrochlorothiazide (MICROZIDE) 12.5 MG capsule Take 12.5 mg by mouth daily with lunch.    [provider]  lisinopril (PRINIVIL,ZESTRIL) 40 MG tablet TAKE ONE-HALF (1/2) TABLET TWICE A DAY (INCREASE IN DOSE ON NEXT REFILL) 04/27/17   Jettie Booze, MD  meloxicam (MOBIC) 15 MG tablet Take 15 mg by mouth as needed. 02/25/17   [provider]  metFORMIN (GLUCOPHAGE) 500 MG tablet Take 1 tablet by mouth 2 (two) times daily. 01/24/17   [provider]  Multiple Vitamin (MULTIVITAMIN WITH MINERALS) TABS tablet Take 1 tablet by mouth daily with lunch. Men's One a Day    [provider]  Polyvinyl Alcohol-Povidone (CLEAR EYES NATURAL TEARS OP) Place 1 drop into both eyes 2 (two) times daily as needed (dry eyes).    [provider]  pregabalin (LYRICA) 75 MG capsule Take 75 mg by mouth daily with lunch.    [provider]  PRESCRIPTION MEDICATION Inhale into the lungs at  bedtime. CPAP    [provider]  rosuvastatin (CRESTOR) 5 MG tablet TAKE 1 TABLET DAILY 04/13/17   Jettie Booze, MD  XARELTO 20 MG TABS tablet TAKE 1 TABLET DAILY WITH SUPPER 12/02/17   Jettie Booze, MD    Family History Family History  Problem Relation Age of Onset  . AAA (abdominal aortic aneurysm) Mother   . Hypertension Mother   . Alzheimer's disease Father   . Cirrhosis Brother        of the liver  . AAA (abdominal aortic aneurysm) Brother   . Hypertension Brother   . Heart attack Neg Hx     Social History Social History   Tobacco Use  . Smoking status: Never Smoker  . Smokeless tobacco: Never Used  Substance Use Topics  . Alcohol use: Yes    Alcohol/week: 0.0 standard drinks    Comment: rare 1 PER month  . Drug use: No     Allergies   Patient has no known allergies.   Review of Systems Review of Systems    Constitutional: Negative for activity change, chills and fever.  Gastrointestinal: Negative for nausea and vomiting.  Musculoskeletal: Positive for arthralgias and joint swelling. Negative for back pain, gait problem and neck pain.  Skin: Negative for wound.  Neurological: Negative for weakness and numbness.     Physical Exam Updated Vital Signs BP 122/72 (BP Location: Right Arm)   Pulse 70   Temp 98 F (36.7 C) (Oral)   Resp 20   SpO2 93%   Physical Exam  Constitutional: He appears well-developed and well-nourished.  HENT:  Head: Normocephalic and atraumatic.  Eyes: Conjunctivae are normal.  Neck: Normal range of motion. Neck supple.  Cardiovascular: Normal pulses. Exam reveals no decreased pulses.  Pulses:      Radial pulses are 2+ on the right side, and 2+ on the left side.  Musculoskeletal: He exhibits tenderness. He exhibits no edema.       Right elbow: Normal.      Right wrist: He exhibits decreased range of motion (mild, he is able to flex and extend), tenderness (ulnar aspect) and bony tenderness. He exhibits no swelling.       Right forearm: Normal. He exhibits no tenderness, no bony tenderness and no swelling.       Right hand: Normal.  Neurological: He is alert. No sensory deficit.  Motor, sensation, and vascular distal to the injury is fully intact.   Skin: Skin is warm and dry.  Psychiatric: He has a normal mood and affect.  Nursing note and vitals reviewed.    ED Treatments / Results  Labs (all labs ordered are listed, but only abnormal results are displayed) Labs Reviewed - No data to display  EKG None  Radiology Dg Wrist Complete Right  Result Date: 12/21/2017 CLINICAL DATA:  Worsening right wrist pain since a fall in April. History of gout EXAM: RIGHT WRIST - COMPLETE 3+ VIEW COMPARISON:  None. FINDINGS: Extensive calcification about the wrist, more nodular and extensive than seen with senile chondrocalcinosis. There is also articular and  extra-articular calcification about the fifth MCP joint. No erosion or malalignment. There is osteopenia. First CMC osteoarthritic narrowing and spurring. Nonspecific soft tissue swelling. IMPRESSION: 1. Tophaceous type calcification at the wrist and fifth MCP joint. 2. First CMC osteoarthritis. Electronically Signed   By: Monte Fantasia M.D.   On: 12/21/2017 09:33    Procedures Procedures (including critical care time)  Medications Ordered in ED Medications  oxyCODONE-acetaminophen (PERCOCET/ROXICET) 5-325 MG per tablet 1 tablet (1 tablet Oral Given 12/21/17 0910)     Initial Impression / Assessment and Plan / ED Course  I have reviewed the triage vital signs and the nursing notes.  Pertinent labs & imaging results that were available during my care of the patient were reviewed by me and considered in my medical decision making (see chart for details).     Patient seen and examined. Work-up initiated. Medications ordered.   Vital signs reviewed and are as follows: BP 122/72 (BP Location: Right Arm)   Pulse 70   Temp 98 F (36.7 C) (Oral)   Resp 20   SpO2 93%   Patient discussed with and seen by Dr. Sherry Ruffing.  10:37 AM patient and wife updated on x-ray results.  Patient will be given short course of pain medication and taper course of prednisone.  Discussed need to closely monitor his blood sugars and discontinue if greater than 300.  He needs to follow-up with his doctors for further work-up.  Encouraged urgent return with worsening pain, redness, swelling, inability to move his wrist.  Patient counseled on use of narcotic pain medications. Counseled not to combine these medications with others containing tylenol. Urged not to drink alcohol, drive, or perform any other activities that requires focus while taking these medications. The patient verbalizes understanding and agrees with the plan.   Final Clinical Impressions(s) / ED Diagnoses   Final diagnoses:  Right wrist pain    Patient with fluctuating right wrist pain since about April.  X-ray today with chronic arthritic changes likely related to gout.  Pain today could be gouty arthritis.  No systemic symptoms to suggest septic arthritis.  Patient has reasonable range of motion given pain.  Do not feel that aspiration is indicated, especially given non-compelling symptoms with history of diabetes and anticoagulation.  Patient to follow-up with his specialist.  ED Discharge Orders         Ordered    predniSONE (DELTASONE) 20 MG tablet     12/21/17 1030    oxyCODONE-acetaminophen (PERCOCET/ROXICET) 5-325 MG tablet  Every 6 hours PRN     12/21/17 1030           Carlisle Cater, PA-C 12/21/17 1039    Tegeler, Gwenyth Allegra, MD 12/22/17 2031

## 2017-12-21 NOTE — ED Triage Notes (Signed)
Worsening rgt wrist pain since injuring during a fall in April - has been seen by PCP's; was told he had gout in rgt wrist hence the increased pain

## 2017-12-21 NOTE — Discharge Instructions (Signed)
Please read and follow all provided instructions.  Your diagnoses today include:  1. Right wrist pain     Tests performed today include:  An x-ray of the affected area - does NOT show any broken bones  Vital signs. See below for your results today.   Medications prescribed:   Prednisone - steroid medicine   It is best to take this medication in the morning to prevent sleeping problems. If you are diabetic, monitor your blood sugar closely and stop taking Prednisone if blood sugar is over 300. Take with food to prevent stomach upset.    Percocet (oxycodone/acetaminophen) - narcotic pain medication  DO NOT drive or perform any activities that require you to be awake and alert because this medicine can make you drowsy. BE VERY CAREFUL not to take multiple medicines containing Tylenol (also called acetaminophen). Doing so can lead to an overdose which can damage your liver and cause liver failure and possibly death.  Take any prescribed medications only as directed.  Home care instructions:   Follow any educational materials contained in this packet  Follow R.I.C.E. Protocol:  R - rest your injury   I  - use ice on injury without applying directly to skin  C - compress injury with bandage or splint  E - elevate the injury as much as possible  Follow-up instructions: Please follow-up with your orthopedic physician (bone specialist) if you continue to have significant pain in 1 week.   Return instructions:   Please return if your fingers are numb or tingling, appear gray or blue, or you have severe pain (also elevate the arm and loosen splint or wrap if you were given one)  Please return to the Emergency Department if you experience worsening symptoms.   Please return if you have any other emergent concerns.  Additional Information:  Your vital signs today were: BP 122/72 (BP Location: Right Arm)    Pulse 70    Temp 98 F (36.7 C) (Oral)    Resp 20    SpO2 93%  If  your blood pressure (BP) was elevated above 135/85 this visit, please have this repeated by your doctor within one month. --------------

## 2018-01-18 DIAGNOSIS — M778 Other enthesopathies, not elsewhere classified: Secondary | ICD-10-CM | POA: Diagnosis not present

## 2018-01-21 ENCOUNTER — Telehealth: Payer: Self-pay | Admitting: Interventional Cardiology

## 2018-01-21 NOTE — Telephone Encounter (Signed)
New Message:      [facility] Pre-operative Risk Assessment    Request for surgical clearance:  1. What type of surgery is being performed? Tooth extraction  2. When is this surgery scheduled? 01/25/2018  3. What type of clearance is required (medical clearance vs. Pharmacy clearance to hold med vs. Both)? Both   4. Are there any medications that need to be held prior to surgery and how long? XARELTO 20 MG TABS tablet 1 week  5. Practice name and name of physician performing surgery? Dr. Hardie Shackleton [facility]   6. What is your office phone number [phone]   7.   What is your office fax number [phone]  8.   Anesthesia type (None, local, MAC, general) ? IV Sedation    Eddie Zimmerman 01/21/2018, 3:09 PM  _________________________________________________________________   (provider comments below)

## 2018-01-22 NOTE — Telephone Encounter (Signed)
Pt hs been made aware to not hold his Xarelto for his upcoming single extraction. Pt thanked me for the call.

## 2018-01-22 NOTE — Telephone Encounter (Signed)
Continue Xarelto for single dental extraction.

## 2018-01-22 NOTE — Telephone Encounter (Signed)
Follow Up:      Magda Paganini called and said the office was closing for the day. She wanted you to be sure and call the pt today to give him directions on his Xarelto. Pt's procedure is scheduled for Monday(01-25-18).

## 2018-01-22 NOTE — Telephone Encounter (Signed)
Please notify pt and I well send to dentist.

## 2018-01-22 NOTE — Telephone Encounter (Signed)
Please address xarelto. Thanks.

## 2018-02-01 ENCOUNTER — Encounter (HOSPITAL_COMMUNITY): Payer: Self-pay

## 2018-02-10 DIAGNOSIS — J018 Other acute sinusitis: Secondary | ICD-10-CM | POA: Diagnosis not present

## 2018-02-18 DIAGNOSIS — M778 Other enthesopathies, not elsewhere classified: Secondary | ICD-10-CM | POA: Diagnosis not present

## 2018-03-05 DIAGNOSIS — H0589 Other disorders of orbit: Secondary | ICD-10-CM | POA: Diagnosis not present

## 2018-03-05 DIAGNOSIS — E1136 Type 2 diabetes mellitus with diabetic cataract: Secondary | ICD-10-CM | POA: Diagnosis not present

## 2018-03-05 DIAGNOSIS — D1779 Benign lipomatous neoplasm of other sites: Secondary | ICD-10-CM | POA: Diagnosis not present

## 2018-03-05 DIAGNOSIS — H0259 Other disorders affecting eyelid function: Secondary | ICD-10-CM | POA: Diagnosis not present

## 2018-03-09 ENCOUNTER — Telehealth: Payer: Self-pay | Admitting: *Deleted

## 2018-03-09 NOTE — Telephone Encounter (Signed)
   [facility] Pre-operative Risk Assessment    Request for surgical clearance:  1. What type of surgery is being performed? ORBITOTOMY FOR ORBITAL LIPOMA   2. When is this surgery scheduled? 06/07/18   3. Are there any medications that need to be held prior to surgery and how long? XARELTO 3 DAYS PRIOR   4. Practice name and name of physician performing surgery? [facility] [place]  5. What is your office phone and fax number? PH# [phone] # [phone]  5. Anesthesia type (None, local, MAC, general) ? CALLED AND LVM NEEDING TYPE OF ANESTHESIA    Eddie Zimmerman 03/09/2018, 10:06 AM  _________________________________________________________________   (provider comments below)

## 2018-03-09 NOTE — Telephone Encounter (Signed)
Pt takes Xarelto for afib with CHADS2VAS score of 6 (age, CHF, HTN, DM, CVA). Renal function normal when checked last year. Request is for pt to hold Xarelto for 3 days prior to procedure, however he is at elevated cardiac risk due to history of stroke. Will defer to MD regarding appropriate length of time off Xarelto.

## 2018-03-09 NOTE — Telephone Encounter (Signed)
Dr. Magda Kiel office called back with type of anesthesia: She states it will be "monitor anestheia care"

## 2018-03-10 DIAGNOSIS — J329 Chronic sinusitis, unspecified: Secondary | ICD-10-CM | POA: Diagnosis not present

## 2018-03-10 NOTE — Telephone Encounter (Signed)
OK to hold Xarelto 3 days prior to surgery.  No Lovenox bridge needed.

## 2018-03-15 ENCOUNTER — Other Ambulatory Visit: Payer: Self-pay | Admitting: Otolaryngology

## 2018-03-15 DIAGNOSIS — J329 Chronic sinusitis, unspecified: Secondary | ICD-10-CM

## 2018-03-16 ENCOUNTER — Ambulatory Visit
Admission: RE | Admit: 2018-03-16 | Discharge: 2018-03-16 | Disposition: A | Discharge status: Home / Self Care | Payer: Medicare Other | Source: Ambulatory Visit | Attending: Otolaryngology | Admitting: Otolaryngology

## 2018-03-16 DIAGNOSIS — J320 Chronic maxillary sinusitis: Secondary | ICD-10-CM | POA: Diagnosis not present

## 2018-03-16 DIAGNOSIS — J329 Chronic sinusitis, unspecified: Secondary | ICD-10-CM

## 2018-03-18 DIAGNOSIS — M25531 Pain in right wrist: Secondary | ICD-10-CM | POA: Diagnosis not present

## 2018-03-18 DIAGNOSIS — M150 Primary generalized (osteo)arthritis: Secondary | ICD-10-CM | POA: Diagnosis not present

## 2018-03-18 DIAGNOSIS — E669 Obesity, unspecified: Secondary | ICD-10-CM | POA: Diagnosis not present

## 2018-03-18 DIAGNOSIS — M0600 Rheumatoid arthritis without rheumatoid factor, unspecified site: Secondary | ICD-10-CM | POA: Diagnosis not present

## 2018-03-18 DIAGNOSIS — Z6836 Body mass index (BMI) 36.0-36.9, adult: Secondary | ICD-10-CM | POA: Diagnosis not present

## 2018-03-18 DIAGNOSIS — M1009 Idiopathic gout, multiple sites: Secondary | ICD-10-CM | POA: Diagnosis not present

## 2018-04-01 NOTE — Telephone Encounter (Signed)
Follow-up   Per Janett Billow have not received information back about clearance. Need information on how long to hold xarelto. Please fax to [phone].

## 2018-04-02 NOTE — Telephone Encounter (Signed)
Looks like surgery not scheduled until 06/07/18. I called pt to discuss his current status and any new sx. No answer. VM left on cell phone. Home phone has been disconnected.

## 2018-04-06 DIAGNOSIS — J329 Chronic sinusitis, unspecified: Secondary | ICD-10-CM | POA: Diagnosis not present

## 2018-04-08 ENCOUNTER — Other Ambulatory Visit: Payer: Self-pay | Admitting: *Deleted

## 2018-04-08 MED ORDER — ROSUVASTATIN CALCIUM 5 MG PO TABS: 5.0000 mg | ORAL_TABLET | Freq: Every day | ORAL | 1 refills | Status: DC

## 2018-04-12 NOTE — Telephone Encounter (Signed)
LM for pt to call back.

## 2018-04-14 ENCOUNTER — Telehealth: Payer: Self-pay | Admitting: Interventional Cardiology

## 2018-04-14 NOTE — Telephone Encounter (Signed)
Pt takes Xarelto for afib with CHADS2VASc score of 6 (age, CHF, HTN, DM, CVA). Renal function normal when checked last year. Recommend holding Xarelto for only 1 day prior to procedure due to hx of CVA.

## 2018-04-14 NOTE — Telephone Encounter (Signed)
   Primary Cardiologist: Larae Grooms, MD  Chart reviewed as part of pre-operative protocol coverage. Patient was contacted 04/14/2018 in reference to pre-operative risk assessment for pending surgery as outlined below. I actually spoke with him today regarding an additional clearance we received for a separate procedure in 05/2018.  SHUBHAM THACKSTON was last seen 11/2017 by Dr. Irish Lack and doing well. I spoke with the patient today and he continues to exercise 3x week and has no anginal symptoms or functional limitation from cardiac standpoint. Therefore, based on ACC/AHA guidelines, the patient would be at acceptable risk for the planned procedure without further cardiovascular testing.   Will route to pharmD for input on Xarelto then will need to bundle final rec to surgeon's office.  Charlie Pitter, PA-C 04/14/2018, 2:51 PM

## 2018-04-14 NOTE — Telephone Encounter (Signed)
   Primary Cardiologist: Larae Grooms, MD  Chart reviewed as part of pre-operative protocol coverage. As below, given past medical history and time since last visit, based on ACC/AHA guidelines, SETH HIGGINBOTHAM would be at acceptable risk for the planned procedure without further cardiovascular testing.   PharmD reviewed chart for how long to hold Xarelto for this particular procedure. Per pharmacist, "Pt takes Xarelto for afib with CHADS2VASc score of 6 (age, CHF, HTN, DM, CVA). Renal function normal when checked last year. Recommend holding Xarelto for only 1 day prior to procedure due to hx of CVA."  I will route this recommendation to the requesting party via [place] fax function and remove from pre-op pool.  Please call with questions.  Charlie Pitter, PA-C 04/14/2018, 5:04 PM

## 2018-04-14 NOTE — Telephone Encounter (Signed)
   Primary Cardiologist: Larae Grooms, MD  Chart reviewed as part of pre-operative protocol coverage. Patient was contacted 04/14/2018 in reference to pre-operative risk assessment for pending surgery as outlined below.  Eddie Zimmerman was last seen 11/2017 by Dr. Irish Lack and doing well. I spoke with the patient today and he continues to exercise 3x week and has no anginal symptoms or functional limitation from cardiac standpoint. Therefore, based on ACC/AHA guidelines, the patient would be at acceptable risk for the planned procedure without further cardiovascular testing.   Regarding anticoagulation, Dr. Irish Lack reviewed chart and states, "OK to hold Xarelto 3 days prior to surgery.  No Lovenox bridge needed. "  I will route this recommendation to the requesting party via Epic fax function and remove from pre-op pool.  Please call with questions.  Charlie Pitter, PA-C 04/14/2018, 2:49 PM

## 2018-04-14 NOTE — Telephone Encounter (Signed)
° ° °  ° °  [facility] Pre-operative Risk Assessment    Request for surgical clearance:  1. What type of surgery is being performed? Right sinus endo w/ removal of tissue  2. When is this surgery scheduled? 04/30/18  3. What type of clearance is required (medical clearance vs. Pharmacy clearance to hold med vs. Both)? both  4. Are there any medications that need to be held prior to surgery and how long? Xarelto  5. Practice name and name of physician performing surgery? Dr Lucia Gaskins  6. What is your office phone number [phone]   7.   What is your office fax number [phone]  8.   Anesthesia type (None, local, MAC, general) ?   [place] 04/14/2018, 1:02 PM  _________________________________________________________________   (provider comments below)

## 2018-04-16 ENCOUNTER — Ambulatory Visit: Payer: Self-pay | Admitting: Otolaryngology

## 2018-04-16 NOTE — H&P (Signed)
PREOPERATIVE H&P  Chief Complaint: chronic right sided sinusitis  HPI: Eddie Zimmerman is a 72 y.o. male who presents for evaluation of chronic right maxillary sinusitis following a dental extraction 3 months ago. He has been on several rounds of antibiotics and continues to have chronic drainage from the right maxillary sinus and right middle meatus. CT scan demonstrated opacification of the right maxillary sinus and right anterior ethmoid region He's taken to the operating room at this time for FESS involving the right ethmoid and right maxillary sinus. He has been cleared by cardiology.  Past Medical History:  Diagnosis Date  . Acute ischemic left MCA stroke ([place]) 08/21/2014  . Aphasia   . Arthritis    hands, wrist & Knee   . Chronic diastolic heart failure ([place]) 06/08/2014  . Cryptogenic stroke ([place]) 06/29/2014  . CVA (cerebral vascular accident) ([place]) 06/14/2014  . Depression   . Diabetes ([place]) 08/21/2014  . Diabetes mellitus without complication ([place])   . Dysrhythmia    ? Atrial fibrillation episode" Loop Recorder" implanted left chest- [place] follows  . Essential hypertension, benign 04/07/2013  . Gout   . Hearing impaired person, bilateral    hearing aids used  . History of kidney stones LAST MAY 2015  . HLD (hyperlipidemia) 08/21/2014  . Hx of laparoscopic gastric banding    remains in place  . Hypertension   . Hypertensive heart disease 03/17/2016  . Hypertrophic obstructive cardiomyopathy ([place]) 04/07/2013  . Obstructive sleep apnea 04/07/2013  . PAF (paroxysmal atrial fibrillation) ([place]) 06/14/2015  . Sleep apnea    cpap use, SETTING OF 13  . Slurred speech 06/08/2014  . Stroke [facility]) 06-07-2014   Past Surgical History:  Procedure Laterality Date  . ARTHROSCOPIC REPAIR PCL Bilateral    right- x1, left- x3   . COLONOSCOPY WITH PROPOFOL N/A 10/24/2014   Procedure: COLONOSCOPY WITH PROPOFOL;  Surgeon: Garlan Fair, MD;  Location: [facility] ENDOSCOPY;  Service: Endoscopy;   Laterality: N/A;  . CYSTOSCOPY WITH RETROGRADE PYELOGRAM, URETEROSCOPY AND STENT PLACEMENT Right 01/31/2016   Procedure: CYSTOSCOPY WITH RETROGRADE PYELOGRAM, URETEROSCOPY AND STENT PLACEMENT;  Surgeon: [name], MD;  Location: [facility] ORS;  Service: Urology;  Laterality: Right;  . CYSTOSCOPY WITH URETHRAL DILATATION N/A 01/31/2016   Procedure: CYSTOSCOPY WITH URETHRAL BALLOON DILATATION;  Surgeon: [name], MD;  Location: [facility] ORS;  Service: Urology;  Laterality: N/A;  . CYSTOSCOPY/URETEROSCOPY/HOLMIUM LASER/STENT PLACEMENT Bilateral 04/14/2016   Procedure: CYSTOSCOPY/URETEROSCOPY   RETROGRADE PYELOGRAM;  Surgeon: [name], MD;  Location: [facility];  Service: Urology;  Laterality: Bilateral;  . HERNIA REPAIR     abdominal  . HOLMIUM LASER APPLICATION Right 00/11/6759   Procedure: HOLMIUM LASER APPLICATION;  Surgeon: [name], MD;  Location: [facility];  Service: Urology;  Laterality: Right;  . JOINT REPLACEMENT Left   . KNEE SURGERY Left   . LAPAROSCOPIC GASTRIC BANDING    . LOOP RECORDER IMPLANT N/A 07/12/2014   Procedure: LOOP RECORDER IMPLANT;  Surgeon: Evans Lance, MD;  Location: [facility] CATH LAB;  Service: Cardiovascular;  Laterality: N/A;  . LOOP RECORDER REMOVAL N/A 08/05/2016   Procedure: Loop Recorder Removal;  Surgeon: Evans Lance, MD;  Location: [facility] CV LAB;  Service: Cardiovascular;  Laterality: N/A;  . TARSAL TUNNEL RELEASE Bilateral 2005  . TEE WITHOUT CARDIOVERSION N/A 06/14/2014   Procedure: TRANSESOPHAGEAL ECHOCARDIOGRAM (TEE);  Surgeon: Sueanne Margarita, MD;  Location: [facility] ENDOSCOPY;  Service: Cardiovascular;  Laterality: N/A;  . TONSILLECTOMY    . TOTAL KNEE  ARTHROPLASTY Right 06/07/2014   Procedure: TOTAL KNEE ARTHROPLASTY;  Surgeon: Kerin Salen, MD;  Location: [place];  Service: Orthopedics;  Laterality: Right;   Social History   Socioeconomic History  . Marital status: Married    Spouse name: [name]  . Number of children: 3  . Years of education: Master's   . Highest education level: Not on file  Occupational History  . Occupation: [employer]  . Financial resource strain: Not on file  . Food insecurity:    Worry: Not on file    Inability: Not on file  . Transportation needs:    Medical: Not on file    Non-medical: Not on file  Tobacco Use  . Smoking status: Never Smoker  . Smokeless tobacco: Never Used  Substance and Sexual Activity  . Alcohol use: Yes    Alcohol/week: 0.0 standard drinks    Comment: rare 1 PER month  . Drug use: No  . Sexual activity: Not on file  Lifestyle  . Physical activity:    Days per week: Not on file    Minutes per session: Not on file  . Stress: Not on file  Relationships  . Social connections:    Talks on phone: Not on file    Gets together: Not on file    Attends religious service: Not on file    Active member of club or organization: Not on file    Attends meetings of clubs or organizations: Not on file    Relationship status: Not on file  Other Topics Concern  . Not on file  Social History Narrative   Lives at home with wife.   Left handed.    Caffeine use: Drinks 1 glass coffee, tea, soda per day. Varies.   Family History  Problem Relation Age of Onset  . AAA (abdominal aortic aneurysm) Mother   . Hypertension Mother   . Alzheimer's disease Father   . Cirrhosis Brother        of the liver  . AAA (abdominal aortic aneurysm) Brother   . Hypertension Brother   . Heart attack Neg Hx    No Known Allergies Prior to Admission medications   Medication Sig Start Date End Date Taking? Authorizing Provider  allopurinol (ZYLOPRIM) 100 MG tablet Take 100 mg by mouth every morning.   Yes [provider]  allopurinol (ZYLOPRIM) 300 MG tablet Take 300 mg by mouth every morning.    Yes [provider]  amoxicillin (AMOXIL) 500 MG capsule Take 2,000 mg by mouth See admin instructions. Take 4 capsules (2000 mg) by mouth one hour prior to dental procedure 01/01/16  Yes  [provider]  Coenzyme Q10 (COQ-10) 200 MG CAPS Take 200 mg by mouth every morning.   Yes [provider]  Cyanocobalamin (VITAMIN B-12 PO) Take 1 tablet by mouth every morning.   Yes [provider]  escitalopram (LEXAPRO) 20 MG tablet Take 20 mg by mouth every morning.    Yes [provider]  glyBURIDE (DIABETA) 5 MG tablet Take 5 mg by mouth every morning.   Yes [provider]  hydrochlorothiazide (MICROZIDE) 12.5 MG capsule Take 12.5 mg by mouth every morning.    Yes [provider]  hydroxychloroquine (PLAQUENIL) 200 MG tablet Take 200 mg by mouth 2 (two) times daily. 12/17/17  Yes [provider]  lisinopril (PRINIVIL,ZESTRIL) 40 MG tablet TAKE ONE-HALF (1/2) TABLET TWICE A DAY (INCREASE IN DOSE ON NEXT REFILL) Patient taking differently: Take  40 mg by mouth every morning. TAKE ONE-HALF (1/2) TABLET TWICE A DAY (INCREASE IN DOSE ON NEXT REFILL) 04/27/17  Yes Jettie Booze, MD  meloxicam (MOBIC) 15 MG tablet Take 15 mg by mouth daily as needed for pain.  02/25/17  Yes [provider]  metFORMIN (GLUCOPHAGE) 1000 MG tablet Take 1,000 mg by mouth 2 (two) times daily.   Yes [provider]  Multiple Vitamin (MULTIVITAMIN WITH MINERALS) TABS tablet Take 1 tablet by mouth every morning. Men's One a Day    Yes [provider]  Polyvinyl Alcohol-Povidone (CLEAR EYES NATURAL TEARS OP) Place 1 drop into both eyes 2 (two) times daily as needed (dry eyes).   Yes [provider]  pregabalin (LYRICA) 75 MG capsule Take 75 mg by mouth 2 (two) times daily.    Yes [provider]  rosuvastatin (CRESTOR) 5 MG tablet Take 1 tablet (5 mg total) by mouth daily. Patient taking differently: Take 5 mg by mouth every morning.  04/08/18  Yes Jettie Booze, MD  XARELTO 20 MG TABS tablet TAKE 1 TABLET DAILY WITH SUPPER Patient taking differently: Take 20 mg by mouth daily with supper.  12/02/17  Yes  Jettie Booze, MD  oxyCODONE-acetaminophen (PERCOCET/ROXICET) 5-325 MG tablet Take 1-2 tablets by mouth every 6 (six) hours as needed for severe pain. Patient not taking: Reported on 04/15/2018 12/21/17   Carlisle Cater, PA-C  predniSONE (DELTASONE) 20 MG tablet 3 Tabs PO Days 1-3, then 2 tabs PO Days 4-6, then 1 tab PO Day 7-9, then Half Tab PO Day 10-12 Patient not taking: Reported on 04/15/2018 12/21/17   Carlisle Cater, PA-C  PRESCRIPTION MEDICATION Inhale into the lungs at bedtime. CPAP    [provider]     Positive ROS: chronic colored drainage from the right nostril  All other systems have been reviewed and were otherwise negative with the exception of those mentioned in the HPI and as above.  Physical Exam: There were no vitals filed for this visit.  General: Alert, no acute distress Oral: Normal oral mucosa and tonsils Nasal: he has mucopurulent discharge from the right middle meatus Neck: No palpable adenopathy or thyroid nodules Ear: Ear canal is clear with normal appearing TMs Cardiovascular: Regular rate and rhythm, no murmur.  Respiratory: Clear to auscultation Neurologic: Alert and oriented x 3   Assessment/Plan: CHRONIC MAXILLARY [place] for Procedure(s): ANTERIOR ETHMOIDECTOMY RIGHT SIDE RIGHT MAXILLARY ANTROSTOMY WITH REMOVAL OF TISSUE FUNCTIONAL ENDOSCOPIC SINUS SURGERY   [name], MD 04/16/2018 5:16 PM

## 2018-04-19 DIAGNOSIS — M1009 Idiopathic gout, multiple sites: Secondary | ICD-10-CM | POA: Diagnosis not present

## 2018-04-19 NOTE — Pre-Procedure Instructions (Addendum)
Eddie Zimmerman  04/19/2018      [id number] - [place], [place]. [place] [address] Phone: [phone] Fax: [place], [address] AT [place] [address] Phone: [phone] Fax: [phone]    Your procedure is scheduled on January 3rd.  Report to [facility] Admitting at 5:30 A.M.  Call this number if you have problems the morning of surgery:  [phone]   Remember:  Do not eat or drink after midnight.     Take these medicines the morning of surgery with A SIP OF WATER   Escitalopram (Lexapro)  Eye drops - if needed  Follow your surgeon's instructions on when to stop Asprin & Xarelto.  If no instructions were given by your surgeon then you will need to call the office to get those instructions.   7 days prior to surgery STOP taking any Aspirin (unless otherwise instructed by your surgeon), Aleve, Naproxen, Ibuprofen, Motrin, Advil, Goody's, BC's, all herbal medications, fish oil, and all vitamins.   WHAT DO I DO ABOUT MY DIABETES MEDICATION?   [name] Do not take oral diabetes medicines (pills) the morning of surgery: Metformin & Glyburide (Diabeta)      How to Manage Your Diabetes Before and After Surgery  Why is it important to control my blood sugar before and after surgery? . Improving blood sugar levels before and after surgery helps healing and can limit problems. . A way of improving blood sugar control is eating a healthy diet by: o  Eating less sugar and carbohydrates o  Increasing activity/exercise o  Talking with your doctor about reaching your blood sugar goals . High blood sugars (greater than 180 mg/dL) can raise your risk of infections and slow your recovery, so you will need to focus on controlling your diabetes during the weeks before surgery. . Make sure that  the doctor who takes care of your diabetes knows about your planned surgery including the date and location.  How do I manage my blood sugar before surgery? . Check your blood sugar at least 4 times a day, starting 2 days before surgery, to make sure that the level is not too high or low. o Check your blood sugar the morning of your surgery when you wake up and every 2 hours until you get to the Short Stay unit. . If your blood sugar is less than 70 mg/dL, you will need to treat for low blood sugar: o Do not take insulin. o Treat a low blood sugar (less than 70 mg/dL) with  cup of clear juice (cranberry or apple), 4 glucose tablets, OR glucose gel. o Recheck blood sugar in 15 minutes after treatment (to make sure it is greater than 70 mg/dL). If your blood sugar is not greater than 70 mg/dL on recheck, call [phone] for further instructions. . Report your blood sugar to the short stay nurse when you get to Short Stay.  . If you are admitted to the hospital after surgery: o Your blood sugar will be checked by the staff and you will probably be given insulin after surgery (instead of oral diabetes medicines) to make sure you have good blood sugar levels. o The goal for blood sugar control after surgery is 80-180 mg/dL.     Do not wear jewelry.  Do not wear lotions, powders, colognes, or  deodorant.  Do not shave 48 hours prior to surgery.  Men may shave face and neck.  Do not bring valuables to the hospital.  [facility] is not responsible for any belongings or valuables.   [place]- Preparing For Surgery  Before surgery, you can play an important role. Because skin is not sterile, your skin needs to be as free of germs as possible. You can reduce the number of germs on your skin by washing with CHG (chlorahexidine gluconate) Soap before surgery.  CHG is an antiseptic cleaner which kills germs and bonds with the skin to continue killing germs even after washing.    Oral Hygiene is  also important to reduce your risk of infection.  Remember - BRUSH YOUR TEETH THE MORNING OF SURGERY WITH YOUR REGULAR TOOTHPASTE  Please do not use if you have an allergy to CHG or antibacterial soaps. If your skin becomes reddened/irritated stop using the CHG.  Do not shave (including legs and underarms) for at least 48 hours prior to first CHG shower. It is OK to shave your face.  Please follow these instructions carefully.   1. Shower the NIGHT BEFORE SURGERY and the MORNING OF SURGERY with CHG.   2. If you chose to wash your hair, wash your hair first as usual with your normal shampoo.  3. After you shampoo, rinse your hair and body thoroughly to remove the shampoo.  4. Use CHG as you would any other liquid soap. You can apply CHG directly to the skin and wash gently with a scrungie or a clean washcloth.   5. Apply the CHG Soap to your body ONLY FROM THE NECK DOWN.  Do not use on open wounds or open sores. Avoid contact with your eyes, ears, mouth and genitals (private parts). Wash Face and genitals (private parts)  with your normal soap.  6. Wash thoroughly, paying special attention to the area where your surgery will be performed.  7. Thoroughly rinse your body with warm water from the neck down.  8. DO NOT shower/wash with your normal soap after using and rinsing off the CHG Soap.  9. Pat yourself dry with a CLEAN TOWEL.  10. Wear CLEAN PAJAMAS to bed the night before surgery, wear comfortable clothes the morning of surgery  11. Place CLEAN SHEETS on your bed the night of your first shower and DO NOT SLEEP WITH PETS.   Day of Surgery:  Do not apply any deodorants/lotions.  Please wear clean clothes to the hospital/surgery center.   Remember to brush your teeth WITH YOUR REGULAR TOOTHPASTE.   Contacts, dentures or bridgework may not be worn into surgery.  Leave your suitcase in the car.  After surgery it may be brought to your room.  For patients admitted to the  hospital, discharge time will be determined by your treatment team.  Patients discharged the day of surgery will not be allowed to drive home.

## 2018-04-20 ENCOUNTER — Encounter (HOSPITAL_COMMUNITY): Payer: Self-pay

## 2018-04-20 ENCOUNTER — Other Ambulatory Visit: Payer: Self-pay

## 2018-04-20 ENCOUNTER — Encounter (HOSPITAL_COMMUNITY)
Admission: RE | Admit: 2018-04-20 | Discharge: 2018-04-20 | Disposition: A | Discharge status: Home / Self Care | Payer: Medicare Other | Source: Ambulatory Visit | Attending: Otolaryngology | Admitting: Otolaryngology

## 2018-04-20 DIAGNOSIS — E119 Type 2 diabetes mellitus without complications: Secondary | ICD-10-CM | POA: Diagnosis not present

## 2018-04-20 DIAGNOSIS — Z01818 Encounter for other preprocedural examination: Secondary | ICD-10-CM | POA: Diagnosis not present

## 2018-04-20 DIAGNOSIS — R9431 Abnormal electrocardiogram [ECG] [EKG]: Secondary | ICD-10-CM | POA: Insufficient documentation

## 2018-04-20 DIAGNOSIS — I10 Essential (primary) hypertension: Secondary | ICD-10-CM | POA: Diagnosis not present

## 2018-04-20 LAB — BASIC METABOLIC PANEL
Anion gap: 11 (ref 5–15)
BUN: 15 mg/dL (ref 8–23)
CO2: 23 mmol/L (ref 22–32)
Calcium: 9.5 mg/dL (ref 8.9–10.3)
Chloride: 105 mmol/L (ref 98–111)
Creatinine, Ser: 1.15 mg/dL (ref 0.61–1.24)
GFR calc Af Amer: >60 mL/min (ref >60)
GFR calc non Af Amer: >60 mL/min (ref >60)
GLUCOSE: 242 mg/dL — AB (ref 70–99)
Potassium: 4.1 mmol/L (ref 3.5–5.1)
Sodium: 139 mmol/L (ref 135–145)

## 2018-04-20 LAB — PROTIME-INR
INR: 1.4
Prothrombin Time: 17 seconds — ABNORMAL HIGH (ref 11.4–15.2)

## 2018-04-20 LAB — GLUCOSE, CAPILLARY: Glucose-Capillary: 193 mg/dL — ABNORMAL HIGH (ref 70–99)

## 2018-04-20 LAB — CBC
HCT: 43.9 % (ref 39.0–52.0)
HEMOGLOBIN: 13.8 g/dL (ref 13.0–17.0)
MCH: 29.9 pg (ref 26.0–34.0)
MCHC: 31.4 g/dL (ref 30.0–36.0)
MCV: 95.2 fL (ref 80.0–100.0)
Platelets: 211 10*3/uL (ref 150–400)
RBC: 4.61 MIL/uL (ref 4.22–5.81)
RDW: 12.9 % (ref 11.5–15.5)
WBC: 6.2 10*3/uL (ref 4.0–10.5)
nRBC: 0 % (ref 0.0–0.2)

## 2018-04-20 NOTE — Progress Notes (Signed)
PCP: [name]  Cardiologist: Dr. Irish Lack  DM: Type 2, does not check fasting sugars  SA: yes, wears CPAP  Per pt, told by Dr. Lucia Gaskins to stop Xarelto 2 days prior to surgery.  Pt denies SOB, cough, fever, chest pain  Pt stated understanding of instructions given for DOS.

## 2018-04-21 LAB — HEMOGLOBIN A1C
Hgb A1c MFr Bld: 6.9 % — ABNORMAL HIGH (ref 4.8–5.6)
MEAN PLASMA GLUCOSE: 151 mg/dL

## 2018-04-22 NOTE — Progress Notes (Signed)
Anesthesia Chart Review:  Case:  [id number] Date/Time:  04/30/18 0715   Procedures:      ANTERIOR ETHMOIDECTOMY RIGHT SIDE (Right )     RIGHT MAXILLARY ANTROSTOMY WITH REMOVAL OF TISSUE (Right )     FUNCTIONAL ENDOSCOPIC SINUS SURGERY (Right )   Anesthesia type:  General   Pre-op diagnosis:  CHRONIC MAXILLARY SINUSITIS,CHRONIC ETHMOIDAL SINUSITIS   Location:  [facility] ROOM 09 / [facility]   Surgeon:  Rozetta Nunnery, MD      DISCUSSION: 72 yo male never smoker. Pertinent hx includes DMII, HTN, Gout, CVA (2016), HTN, Paroxysmal Afib, OSA on CPAP, HOCM.  Pt follows with Dr. Irish Lack for hx of Afib. Cardiac clearance 04/14/18 by Melina Copa PA-C: "Patient was contacted 04/14/2018 in reference to pre-operative risk assessment for pending surgery as outlined below. I actually spoke with him today regarding an additional clearance we received for a separate procedure in 05/2018.  KIEFER OPHEIM was last seen 11/2017 by Dr. Irish Lack and doing well. I spoke with the patient today and he continues to exercise 3x week and has no anginal symptoms or functional limitation from cardiac standpoint. Therefore, based on ACC/AHA guidelines, the patient would be at acceptable risk for the planned procedure without further cardiovascular testing."  PharmD reviewed chart for how long to hold Xarelto for this particular procedure. Per pharmacist, "Pt takes Xarelto for afib with CHADS2VASc score of 6 (age, CHF, HTN, DM, CVA). Renal function normal when checked last year. Recommend holding Xarelto for only 1 day prior to procedure due to hx of CVA."  TEE 2016 showed Left ventricle cavity size was normal. Wall thickness was normal. Systolic function was normal. The estimated ejection fraction was in the range of 55% to 60%. Wall motion was normal; there were no regional wall motion abnormalities.  Anticipate he can proceed as planned barring acute status change.   VS: BP 135/77   Pulse 77   Temp 36.7 C   Resp 20   Ht  5\' 10"  (1.778 m)   Wt 112.5 kg   SpO2 100%   BMI 35.58 kg/m   PROVIDERS: [name], MD is PCP  Larae Grooms, MD is Cardiologist  LABS: Labs reviewed: Acceptable for surgery. (all labs ordered are listed, but only abnormal results are displayed)  Labs Reviewed  GLUCOSE, CAPILLARY - Abnormal; Notable for the following components:      Result Value   Glucose-Capillary 193 (*)    All other components within normal limits  BASIC METABOLIC PANEL - Abnormal; Notable for the following components:   Glucose, Bld 242 (*)    All other components within normal limits  HEMOGLOBIN A1C - Abnormal; Notable for the following components:   Hgb A1c MFr Bld 6.9 (*)    All other components within normal limits  PROTIME-INR - Abnormal; Notable for the following components:   Prothrombin Time 17.0 (*)    All other components within normal limits  CBC    EKG: 04/20/18: Sinus rhythm with 1st degree A-V block. Rate 63. Low voltage QRS. Nonspecific T wave abnormality. No significant change.  CV: TEE 06/14/2014: Study Conclusions  - Left ventricle: The cavity size was normal. Wall thickness was normal. Systolic function was normal. The estimated ejection fraction was in the range of 55% to 60%. Wall motion was normal; there were no regional wall motion abnormalities. - Aortic valve: Mild thickening and calcification, consistent with sclerosis. No evidence of vegetation. There was mild to moderate regurgitation directed eccentrically  in the LVOT and towards the mitral anterior leaflet. - Mitral valve: No evidence of vegetation. There was mild regurgitation. - Left atrium: No evidence of thrombus in the atrial cavity or appendage. No evidence of thrombus in the appendage. - Right atrium: No evidence of thrombus in the atrial cavity or appendage. - Atrial septum: No defect or patent foramen ovale was identified. Echo contrast study showed no right-to-left atrial  level shunt, following an increase in RA pressure induced by provocative maneuvers. - Tricuspid valve: No evidence of vegetation. - Pulmonic valve: No evidence of vegetation. There was trivial regurgitation.  Impressions:  - No cardiac source of emboli was indentified.  Carotid US 06/09/2014: Summary:  - Technically difficult due to bifurcation at the jaw line and excessive snoring. - Bilateral 1% to 39% ICA stenosis. Vertebral artey flow is antegrade.  Past Medical History:  Diagnosis Date  . Acute ischemic left MCA stroke ([place]) 08/21/2014  . Aphasia   . Arthritis    hands, wrist & Knee   . Chronic diastolic heart failure ([place]) 06/08/2014  . Cryptogenic stroke ([place]) 06/29/2014  . CVA (cerebral vascular accident) ([place]) 06/14/2014  . Depression   . Diabetes ([place]) 08/21/2014  . Diabetes mellitus without complication ([place])   . Dysrhythmia    ? Atrial fibrillation episode" Loop Recorder" implanted left chest- [place] follows  . Essential hypertension, benign 04/07/2013  . Gout   . Hearing impaired person, bilateral    hearing aids used  . History of kidney stones LAST MAY 2015  . HLD (hyperlipidemia) 08/21/2014  . Hx of laparoscopic gastric banding    remains in place  . Hypertension   . Hypertensive heart disease 03/17/2016  . Hypertrophic obstructive cardiomyopathy ([place]) 04/07/2013  . Obstructive sleep apnea 04/07/2013  . PAF (paroxysmal atrial fibrillation) ([place]) 06/14/2015  . Sleep apnea    cpap use, SETTING OF 13  . Slurred speech 06/08/2014  . Stroke [facility]) 06-07-2014    Past Surgical History:  Procedure Laterality Date  . ARTHROSCOPIC REPAIR PCL Bilateral    right- x1, left- x3   . COLONOSCOPY WITH PROPOFOL N/A 10/24/2014   Procedure: COLONOSCOPY WITH PROPOFOL;  Surgeon: Garlan Fair, MD;  Location: [facility] END[facility]COPY;  Service: Endoscopy;  Laterality: N/A;  . CYSTOSCOPY WITH RETROGRADE PYELOGRAM, URETEROSCOPY AND STENT PLACEMENT Right 01/31/2016   Procedure:  CYSTOSCOPY WITH RETROGRADE PYELOGRAM, URETEROSCOPY AND STENT PLACEMENT;  Surgeon: [name], MD;  Location: [facility] ORS;  Service: Urology;  Laterality: Right;  . CYSTOSCOPY WITH URETHRAL DILATATION N/A 01/31/2016   Procedure: CYSTOSCOPY WITH URETHRAL BALLOON DILATATION;  Surgeon: [name], MD;  Location: [facility];  Service: Urology;  Laterality: N/A;  . CYSTOSCOPY/URETEROSCOPY/HOLMIUM LASER/STENT PLACEMENT Bilateral 04/14/2016   Procedure: CYSTOSCOPY/URETEROSCOPY   RETROGRADE PYELOGRAM;  Surgeon: [name], MD;  Location: [facility];  Service: Urology;  Laterality: Bilateral;  . HERNIA REPAIR     abdominal  . HOLMIUM LASER APPLICATION Right 99/05/4266   Procedure: HOLMIUM LASER APPLICATION;  Surgeon: [name], MD;  Location: [facility];  Service: Urology;  Laterality: Right;  . JOINT REPLACEMENT Left   . KNEE SURGERY Left   . LAPAROSCOPIC GASTRIC BANDING    . LOOP RECORDER IMPLANT N/A 07/12/2014   Procedure: LOOP RECORDER IMPLANT;  Surgeon: Evans Lance, MD;  Location: [facility] CATH LAB;  Service: Cardiovascular;  Laterality: N/A;  . LOOP RECORDER REMOVAL N/A 08/05/2016   Procedure: Loop Recorder Removal;  Surgeon: Evans Lance, MD;  Location: [facility] CV LAB;  Service: Cardiovascular;  Laterality: N/A;  . TARSAL TUNNEL RELEASE Bilateral 2005  . TEE WITHOUT CARDIOVERSION N/A 06/14/2014   Procedure: TRANSESOPHAGEAL ECHOCARDIOGRAM (TEE);  Surgeon: Sueanne Margarita, MD;  Location: [facility] ENDOSCOPY;  Service: Cardiovascular;  Laterality: N/A;  . TONSILLECTOMY    . TOTAL KNEE ARTHROPLASTY Right 06/07/2014   Procedure: TOTAL KNEE ARTHROPLASTY;  Surgeon: Kerin Salen, MD;  Location: [facility];  Service: Orthopedics;  Laterality: Right;    MEDICATIONS: . allopurinol (ZYLOPRIM) 100 MG tablet  . allopurinol (ZYLOPRIM) 300 MG tablet  . amoxicillin (AMOXIL) 500 MG capsule  . Coenzyme Q10 (COQ-10) 200 MG CAPS  . Cyanocobalamin (VITAMIN B-12 PO)  . escitalopram (LEXAPRO) 20 MG tablet  . glyBURIDE (DIABETA) 5  MG tablet  . hydrochlorothiazide (MICROZIDE) 12.5 MG capsule  . hydroxychloroquine (PLAQUENIL) 200 MG tablet  . lisinopril (PRINIVIL,ZESTRIL) 40 MG tablet  . meloxicam (MOBIC) 15 MG tablet  . metFORMIN (GLUCOPHAGE) 1000 MG tablet  . Multiple Vitamin (MULTIVITAMIN WITH MINERALS) TABS tablet  . oxyCODONE-acetaminophen (PERCOCET/ROXICET) 5-325 MG tablet  . Polyvinyl Alcohol-Povidone (CLEAR EYES NATURAL TEARS OP)  . predniSONE (DELTASONE) 20 MG tablet  . pregabalin (LYRICA) 75 MG capsule  . PRESCRIPTION MEDICATION  . rosuvastatin (CRESTOR) 5 MG tablet  . XARELTO 20 MG TABS tablet   No current facility-administered medications for this encounter.      Wynonia Musty [facility] Short Stay Center/Anesthesiology Phone [phone] 04/22/2018 12:57 PM

## 2018-04-22 NOTE — Anesthesia Preprocedure Evaluation (Addendum)
Anesthesia Evaluation  Patient identified by MRN, date of birth, ID band Patient awake    Reviewed: Allergy & Precautions, H&P , NPO status   Airway Mallampati: II   Neck ROM: full    Dental   Pulmonary sleep apnea ,    breath sounds clear to auscultation       Cardiovascular hypertension, + dysrhythmias Atrial Fibrillation  Rhythm:regular Rate:Normal     Neuro/Psych PSYCHIATRIC DISORDERS Depression CVA    GI/Hepatic   Endo/Other  diabetes, Type 2obese  Renal/GU stones     Musculoskeletal  (+) Arthritis ,   Abdominal   Peds  Hematology   Anesthesia Other Findings   Reproductive/Obstetrics                            Anesthesia Physical Anesthesia Plan  ASA: III  Anesthesia Plan: General   Post-op Pain Management:    Induction: Intravenous  PONV Risk Score and Plan: 2 and Ondansetron, Dexamethasone and Treatment may vary due to age or medical condition  Airway Management Planned: Oral ETT  Additional Equipment:   Intra-op Plan:   Post-operative Plan: Extubation in OR  Informed Consent: I have reviewed the patients History and Physical, chart, labs and discussed the procedure including the risks, benefits and alternatives for the proposed anesthesia with the patient or authorized representative who has indicated his/her understanding and acceptance.     Plan Discussed with: CRNA, Anesthesiologist and Surgeon  Anesthesia Plan Comments: (See PAT note 04/20/2018 by Karoline Caldwell, PA-C )       Anesthesia Quick Evaluation

## 2018-04-29 DIAGNOSIS — J329 Chronic sinusitis, unspecified: Secondary | ICD-10-CM | POA: Diagnosis not present

## 2018-04-30 ENCOUNTER — Ambulatory Visit (HOSPITAL_COMMUNITY): Payer: Medicare Other | Admitting: Vascular Surgery

## 2018-04-30 ENCOUNTER — Ambulatory Visit (HOSPITAL_COMMUNITY): Payer: Medicare Other

## 2018-04-30 ENCOUNTER — Ambulatory Visit (HOSPITAL_COMMUNITY)
Admission: RE | Admit: 2018-04-30 | Discharge: 2018-04-30 | Disposition: A | Discharge status: Home / Self Care | Payer: Medicare Other | Attending: Otolaryngology | Admitting: Otolaryngology

## 2018-04-30 ENCOUNTER — Encounter (HOSPITAL_COMMUNITY)
Admission: RE | Disposition: A | Discharge status: Home / Self Care | Payer: Self-pay | Source: Home / Self Care | Attending: Otolaryngology

## 2018-04-30 DIAGNOSIS — Z96651 Presence of right artificial knee joint: Secondary | ICD-10-CM | POA: Insufficient documentation

## 2018-04-30 DIAGNOSIS — J322 Chronic ethmoidal sinusitis: Secondary | ICD-10-CM | POA: Diagnosis not present

## 2018-04-30 DIAGNOSIS — E119 Type 2 diabetes mellitus without complications: Secondary | ICD-10-CM

## 2018-04-30 DIAGNOSIS — I119 Hypertensive heart disease without heart failure: Secondary | ICD-10-CM

## 2018-04-30 DIAGNOSIS — Z01810 Encounter for preprocedural cardiovascular examination: Secondary | ICD-10-CM

## 2018-04-30 DIAGNOSIS — M1710 Unilateral primary osteoarthritis, unspecified knee: Secondary | ICD-10-CM

## 2018-04-30 DIAGNOSIS — F329 Major depressive disorder, single episode, unspecified: Secondary | ICD-10-CM | POA: Diagnosis not present

## 2018-04-30 DIAGNOSIS — E782 Mixed hyperlipidemia: Secondary | ICD-10-CM

## 2018-04-30 DIAGNOSIS — I421 Obstructive hypertrophic cardiomyopathy: Secondary | ICD-10-CM | POA: Diagnosis not present

## 2018-04-30 DIAGNOSIS — Z7901 Long term (current) use of anticoagulants: Secondary | ICD-10-CM | POA: Insufficient documentation

## 2018-04-30 DIAGNOSIS — Z8673 Personal history of transient ischemic attack (TIA), and cerebral infarction without residual deficits: Secondary | ICD-10-CM | POA: Diagnosis not present

## 2018-04-30 DIAGNOSIS — I10 Essential (primary) hypertension: Secondary | ICD-10-CM | POA: Diagnosis not present

## 2018-04-30 DIAGNOSIS — Z7984 Long term (current) use of oral hypoglycemic drugs: Secondary | ICD-10-CM | POA: Diagnosis not present

## 2018-04-30 DIAGNOSIS — M109 Gout, unspecified: Secondary | ICD-10-CM | POA: Diagnosis not present

## 2018-04-30 DIAGNOSIS — Z791 Long term (current) use of non-steroidal anti-inflammatories (NSAID): Secondary | ICD-10-CM | POA: Diagnosis not present

## 2018-04-30 DIAGNOSIS — Z9884 Bariatric surgery status: Secondary | ICD-10-CM | POA: Diagnosis not present

## 2018-04-30 DIAGNOSIS — I110 Hypertensive heart disease with heart failure: Secondary | ICD-10-CM | POA: Diagnosis not present

## 2018-04-30 DIAGNOSIS — M1711 Unilateral primary osteoarthritis, right knee: Secondary | ICD-10-CM

## 2018-04-30 DIAGNOSIS — E785 Hyperlipidemia, unspecified: Secondary | ICD-10-CM | POA: Insufficient documentation

## 2018-04-30 DIAGNOSIS — Z79899 Other long term (current) drug therapy: Secondary | ICD-10-CM | POA: Insufficient documentation

## 2018-04-30 DIAGNOSIS — I5032 Chronic diastolic (congestive) heart failure: Secondary | ICD-10-CM | POA: Diagnosis not present

## 2018-04-30 DIAGNOSIS — G4733 Obstructive sleep apnea (adult) (pediatric): Secondary | ICD-10-CM

## 2018-04-30 DIAGNOSIS — J320 Chronic maxillary sinusitis: Secondary | ICD-10-CM | POA: Diagnosis not present

## 2018-04-30 DIAGNOSIS — G4730 Sleep apnea, unspecified: Secondary | ICD-10-CM | POA: Insufficient documentation

## 2018-04-30 DIAGNOSIS — E669 Obesity, unspecified: Secondary | ICD-10-CM

## 2018-04-30 HISTORY — PX: ETHMOIDECTOMY: SHX5197

## 2018-04-30 HISTORY — PX: MAXILLARY ANTROSTOMY: SHX2003

## 2018-04-30 HISTORY — PX: NASAL SINUS SURGERY: SHX719

## 2018-04-30 LAB — GLUCOSE, CAPILLARY
Glucose-Capillary: 145 mg/dL — ABNORMAL HIGH (ref 70–99)
Glucose-Capillary: 142 mg/dL — ABNORMAL HIGH (ref 70–99)

## 2018-04-30 LAB — PROTIME-INR
INR: 1.11
Prothrombin Time: 14.2 seconds (ref 11.4–15.2)

## 2018-04-30 SURGERY — ETHMOIDECTOMY
Anesthesia: General | Site: Nose | Laterality: Right

## 2018-04-30 MED ORDER — DEXAMETHASONE SODIUM PHOSPHATE 10 MG/ML IJ SOLN
INTRAMUSCULAR | Status: DC | PRN
  Administered 2018-04-30: 4 mg via INTRAVENOUS

## 2018-04-30 MED ORDER — LIDOCAINE-EPINEPHRINE 1 %-1:100000 IJ SOLN
INTRAMUSCULAR | Status: AC
  Filled 2018-04-30: qty 1

## 2018-04-30 MED ORDER — OXYMETAZOLINE HCL 0.05 % NA SOLN
NASAL | Status: AC
  Filled 2018-04-30: qty 15

## 2018-04-30 MED ORDER — ROCURONIUM BROMIDE 50 MG/5ML IV SOSY
PREFILLED_SYRINGE | INTRAVENOUS | Status: AC
  Filled 2018-04-30: qty 5

## 2018-04-30 MED ORDER — LIDOCAINE-EPINEPHRINE 1 %-1:100000 IJ SOLN
INTRAMUSCULAR | Status: DC | PRN
  Administered 2018-04-30: 9 mL

## 2018-04-30 MED ORDER — PROPOFOL 10 MG/ML IV BOLUS
INTRAVENOUS | Status: AC
  Filled 2018-04-30: qty 20

## 2018-04-30 MED ORDER — SUGAMMADEX SODIUM 200 MG/2ML IV SOLN
INTRAVENOUS | Status: DC | PRN
  Administered 2018-04-30: 250 mg via INTRAVENOUS

## 2018-04-30 MED ORDER — EPHEDRINE 5 MG/ML INJ
INTRAVENOUS | Status: AC
  Filled 2018-04-30: qty 10

## 2018-04-30 MED ORDER — PROPOFOL 10 MG/ML IV BOLUS
INTRAVENOUS | Status: DC | PRN
  Administered 2018-04-30: 140 mg via INTRAVENOUS

## 2018-04-30 MED ORDER — LACTATED RINGERS IV SOLN
INTRAVENOUS | Status: DC | PRN
  Administered 2018-04-30: 07:00:00 via INTRAVENOUS

## 2018-04-30 MED ORDER — SODIUM CHLORIDE 0.9 % IV SOLN
INTRAVENOUS | Status: AC
  Filled 2018-04-30: qty 500000

## 2018-04-30 MED ORDER — SODIUM CHLORIDE 0.9 % IV SOLN
INTRAVENOUS | Status: DC | PRN
  Administered 2018-04-30: 500 mL

## 2018-04-30 MED ORDER — PHENYLEPHRINE 40 MCG/ML (10ML) SYRINGE FOR IV PUSH (FOR BLOOD PRESSURE SUPPORT)
PREFILLED_SYRINGE | INTRAVENOUS | Status: DC | PRN
  Administered 2018-04-30: 80 ug via INTRAVENOUS

## 2018-04-30 MED ORDER — DEXAMETHASONE SODIUM PHOSPHATE 10 MG/ML IJ SOLN
INTRAMUSCULAR | Status: AC
  Filled 2018-04-30: qty 1

## 2018-04-30 MED ORDER — FENTANYL CITRATE (PF) 250 MCG/5ML IJ SOLN
INTRAMUSCULAR | Status: AC
  Filled 2018-04-30: qty 5

## 2018-04-30 MED ORDER — EPHEDRINE SULFATE 50 MG/ML IJ SOLN
INTRAMUSCULAR | Status: DC | PRN
  Administered 2018-04-30: 10 mg via INTRAVENOUS
  Administered 2018-04-30: 5 mg via INTRAVENOUS
  Administered 2018-04-30: 10 mg via INTRAVENOUS
  Administered 2018-04-30: 5 mg via INTRAVENOUS

## 2018-04-30 MED ORDER — CHLORHEXIDINE GLUCONATE CLOTH 2 % EX PADS: 6.0000 | MEDICATED_PAD | Freq: Once | CUTANEOUS | Status: DC

## 2018-04-30 MED ORDER — ONDANSETRON HCL 4 MG/2ML IJ SOLN
INTRAMUSCULAR | Status: AC
  Filled 2018-04-30: qty 2

## 2018-04-30 MED ORDER — MIDAZOLAM HCL 2 MG/2ML IJ SOLN
INTRAMUSCULAR | Status: AC
  Filled 2018-04-30: qty 2

## 2018-04-30 MED ORDER — CEFAZOLIN SODIUM-DEXTROSE 2-4 GM/100ML-% IV SOLN
2.0000 g | INTRAVENOUS | Status: AC
  Administered 2018-04-30: 2 g via INTRAVENOUS

## 2018-04-30 MED ORDER — OXYMETAZOLINE HCL 0.05 % NA SOLN
NASAL | Status: DC | PRN
  Administered 2018-04-30: 1

## 2018-04-30 MED ORDER — CEFAZOLIN SODIUM-DEXTROSE 2-4 GM/100ML-% IV SOLN
INTRAVENOUS | Status: AC
  Filled 2018-04-30: qty 100

## 2018-04-30 MED ORDER — BACITRACIN ZINC 500 UNIT/GM EX OINT
TOPICAL_OINTMENT | CUTANEOUS | Status: AC
  Filled 2018-04-30: qty 28.35

## 2018-04-30 MED ORDER — PHENYLEPHRINE 40 MCG/ML (10ML) SYRINGE FOR IV PUSH (FOR BLOOD PRESSURE SUPPORT)
PREFILLED_SYRINGE | INTRAVENOUS | Status: AC
  Filled 2018-04-30: qty 10

## 2018-04-30 MED ORDER — LIDOCAINE 2% (20 MG/ML) 5 ML SYRINGE
INTRAMUSCULAR | Status: DC | PRN
  Administered 2018-04-30: 60 mg via INTRAVENOUS

## 2018-04-30 MED ORDER — ONDANSETRON HCL 4 MG/2ML IJ SOLN
INTRAMUSCULAR | Status: DC | PRN
  Administered 2018-04-30: 4 mg via INTRAVENOUS

## 2018-04-30 MED ORDER — CEPHALEXIN 500 MG PO CAPS: 500.0000 mg | ORAL_CAPSULE | Freq: Two times a day (BID) | ORAL | 0 refills | Status: DC

## 2018-04-30 MED ORDER — ROCURONIUM BROMIDE 10 MG/ML (PF) SYRINGE
PREFILLED_SYRINGE | INTRAVENOUS | Status: DC | PRN
  Administered 2018-04-30: 50 mg via INTRAVENOUS

## 2018-04-30 MED ORDER — SODIUM CHLORIDE 0.9 % IR SOLN
Status: DC | PRN
  Administered 2018-04-30: 1000 mL

## 2018-04-30 MED ORDER — BACITRACIN ZINC 500 UNIT/GM EX OINT
TOPICAL_OINTMENT | CUTANEOUS | Status: DC | PRN
  Administered 2018-04-30: 1 via TOPICAL

## 2018-04-30 MED ORDER — HYDROCODONE-ACETAMINOPHEN 5-325 MG PO TABS: 1.0000 | ORAL_TABLET | Freq: Four times a day (QID) | ORAL | 0 refills | Status: DC | PRN

## 2018-04-30 MED ORDER — LIDOCAINE 2% (20 MG/ML) 5 ML SYRINGE
INTRAMUSCULAR | Status: AC
  Filled 2018-04-30: qty 5

## 2018-04-30 MED ORDER — MIDAZOLAM HCL 2 MG/2ML IJ SOLN
INTRAMUSCULAR | Status: DC | PRN
  Administered 2018-04-30: 1 mg via INTRAVENOUS

## 2018-04-30 MED ORDER — FENTANYL CITRATE (PF) 250 MCG/5ML IJ SOLN
INTRAMUSCULAR | Status: DC | PRN
  Administered 2018-04-30: 125 ug via INTRAVENOUS

## 2018-04-30 SURGICAL SUPPLY — 55 items
ATTRACTOMAT 16X20 MAGNETIC DRP (DRAPES) IMPLANT
BLADE 10 SAFETY STRL DISP (BLADE) ×3 IMPLANT
BLADE RAD40 ROTATE 4M 4 5PK (BLADE) IMPLANT
BLADE RAD40 ROTATE 4M 4MM 5PK (BLADE)
BLADE RAD60 ROTATE M4 4 5PK (BLADE) IMPLANT
BLADE RAD60 ROTATE M4 4MM 5PK (BLADE)
BLADE SURG 15 STRL LF DISP TIS (BLADE) IMPLANT
BLADE SURG 15 STRL SS (BLADE)
BLADE TRICUT ROTATE M4 4 5PK (BLADE) IMPLANT
BLADE TRICUT ROTATE M4 4MM 5PK (BLADE)
CANISTER SUCT 3000ML PPV (MISCELLANEOUS) ×3 IMPLANT
CLEANER TIP ELECTROSURG 2X2 (MISCELLANEOUS) IMPLANT
COAGULATOR SUCT 8FR VV (MISCELLANEOUS) IMPLANT
COVER WAND RF STERILE (DRAPES) ×3 IMPLANT
DRAPE HALF SHEET 40X57 (DRAPES) IMPLANT
DRESSING NASAL KENNEDY 3.5X.9 (MISCELLANEOUS) IMPLANT
DRESSING TELFA 8X10 (GAUZE/BANDAGES/DRESSINGS) IMPLANT
DRSG NASAL KENNEDY 3.5X.9 (MISCELLANEOUS)
DRSG NASOPORE 8CM (GAUZE/BANDAGES/DRESSINGS) ×2 IMPLANT
DRSG TELFA 3X8 NADH (GAUZE/BANDAGES/DRESSINGS) IMPLANT
ELECT COATED BLADE 2.86 ST (ELECTRODE) IMPLANT
ELECT REM PT RETURN 9FT ADLT (ELECTROSURGICAL)
ELECTRODE REM PT RTRN 9FT ADLT (ELECTROSURGICAL) IMPLANT
FILTER ARTHROSCOPY CONVERTOR (FILTER) IMPLANT
GAUZE SPONGE 4X4 12PLY STRL (GAUZE/BANDAGES/DRESSINGS) ×2 IMPLANT
GLOVE SS BIOGEL STRL SZ 7.5 (GLOVE) ×2 IMPLANT
GLOVE SUPERSENSE BIOGEL SZ 7.5 (GLOVE) ×4
GOWN STRL REUS W/ TWL LRG LVL3 (GOWN DISPOSABLE) ×2 IMPLANT
GOWN STRL REUS W/ TWL XL LVL3 (GOWN DISPOSABLE) ×1 IMPLANT
GOWN STRL REUS W/TWL LRG LVL3 (GOWN DISPOSABLE) ×6
GOWN STRL REUS W/TWL XL LVL3 (GOWN DISPOSABLE) ×3
KIT BASIN OR (CUSTOM PROCEDURE TRAY) ×3 IMPLANT
KIT TURNOVER KIT B (KITS) ×3 IMPLANT
MARKER SPHERE PSV REFLC NDI (MISCELLANEOUS) ×15 IMPLANT
NDL HYPO 25GX1X1/2 BEV (NEEDLE) IMPLANT
NDL PRECISIONGLIDE 27X1.5 (NEEDLE) ×1 IMPLANT
NDL SPNL 25GX3.5 QUINCKE BL (NEEDLE) IMPLANT
NEEDLE HYPO 25GX1X1/2 BEV (NEEDLE) IMPLANT
NEEDLE PRECISIONGLIDE 27X1.5 (NEEDLE) ×3 IMPLANT
NEEDLE SPNL 25GX3.5 QUINCKE BL (NEEDLE) IMPLANT
NS IRRIG 1000ML POUR BTL (IV SOLUTION) ×3 IMPLANT
PAD ARMBOARD 7.5X6 YLW CONV (MISCELLANEOUS) ×6 IMPLANT
PAD DRESSING TELFA 3X8 NADH (GAUZE/BANDAGES/DRESSINGS) IMPLANT
PATTIES SURGICAL .5 X3 (DISPOSABLE) ×3 IMPLANT
PENCIL BUTTON HOLSTER BLD 10FT (ELECTRODE) IMPLANT
PENCIL FOOT CONTROL (ELECTRODE) IMPLANT
SOLUTION ANTI FOG 6CC (MISCELLANEOUS) ×3 IMPLANT
SPECIMEN JAR SMALL (MISCELLANEOUS) ×6 IMPLANT
SPLINT NASAL DOYLE BI-VL (GAUZE/BANDAGES/DRESSINGS) IMPLANT
SYR BULB 3OZ (MISCELLANEOUS) ×2 IMPLANT
TOWEL OR 17X24 6PK STRL BLUE (TOWEL DISPOSABLE) ×3 IMPLANT
TRAY ENT MC OR (CUSTOM PROCEDURE TRAY) ×3 IMPLANT
TUBE CONNECTING 12'X1/4 (SUCTIONS) ×1
TUBE CONNECTING 12X1/4 (SUCTIONS) ×2 IMPLANT
WATER STERILE IRR 1000ML POUR (IV SOLUTION) ×3 IMPLANT

## 2018-04-30 NOTE — Brief Op Note (Signed)
04/30/2018  8:55 AM  PATIENT:  Eddie Zimmerman  73 y.o. male  PRE-OPERATIVE DIAGNOSIS:  CHRONIC MAXILLARY [place] ETHMOIDAL SINUSITIS  POST-OPERATIVE DIAGNOSIS:  CHRONIC MAXILLARY [place] ETHMOID SINUSITUS  PROCEDURE:  Procedure(s): ANTERIOR ETHMOIDECTOMY RIGHT SIDE (Right) RIGHT MAXILLARY ANTROSTOMY WITH REMOVAL OF TISSUE (Right) FUNCTIONAL ENDOSCOPIC SINUS SURGERY (Right)  SURGEON:  Surgeon(s) and Role:    Rozetta Nunnery, MD - Primary  PHYSICIAN ASSISTANT:   ASSISTANTS: none   ANESTHESIA:   general  EBL:  30 cc   BLOOD ADMINISTERED:none  DRAINS: none   LOCAL MEDICATIONS USED:  XYLOCAINE with EPI 10 cc  SPECIMEN:  No Specimen  DISPOSITION OF SPECIMEN:  N/A  COUNTS:  YES  TOURNIQUET:  * No tourniquets in log *  DICTATION: .Other Dictation: Dictation Number [id number]  PLAN OF CARE: Discharge to home after PACU  PATIENT DISPOSITION:  PACU - hemodynamically stable.   Delay start of Pharmacological VTE agent (>24hrs) due to surgical blood loss or risk of bleeding: yes

## 2018-04-30 NOTE — Transfer of Care (Signed)
Immediate Anesthesia Transfer of Care Note  Patient: Eddie Zimmerman  Procedure(s) Performed: ANTERIOR ETHMOIDECTOMY RIGHT SIDE (Right Nose) RIGHT MAXILLARY ANTROSTOMY WITH REMOVAL OF TISSUE (Right Nose) FUNCTIONAL ENDOSCOPIC SINUS SURGERY (Right Nose)  Patient Location: PACU  Anesthesia Type:General  Level of Consciousness: awake and alert   Airway & Oxygen Therapy: Patient Spontanous Breathing and Patient connected to face mask oxygen  Post-op Assessment: Report given to RN and Post -op Vital signs reviewed and stable  Post vital signs: Reviewed and stable  Last Vitals:  Vitals Value Taken Time  BP 139/76 04/30/2018  9:06 AM  Temp    Pulse 72 04/30/2018  9:08 AM  Resp 17 04/30/2018  9:08 AM  SpO2 99 % 04/30/2018  9:08 AM  Vitals shown include unvalidated device data.  Last Pain:  Vitals:   04/30/18 0559  TempSrc: Oral         Complications: No apparent anesthesia complications

## 2018-04-30 NOTE — Op Note (Signed)
NAME: Eddie Zimmerman, Eddie Zimmerman MEDICAL RECORD [id number] ACCOUNT [id number] DATE OF BIRTH:23-Feb-1946 FACILITY: [facility] LOCATION: [place], [place]  OPERATIVE REPORT  DATE OF PROCEDURE:  04/30/2018  PREOPERATIVE DIAGNOSIS:  Chronic right maxillary and ethmoid sinus disease.  POSTOPERATIVE DIAGNOSIS:  Chronic right maxillary and ethmoid sinus disease.  OPERATION PERFORMED:  Functional endoscopic sinus surgery with right maxillary ostia enlargement with removal of tissue and irrigation, right anterior ethmoidectomy.  SURGEON:  [name], MD  ANESTHESIA:  General endotracheal.  ESTIMATED BLOOD LOSS:  Minimal.  COMPLICATIONS:  None.  BRIEF CLINICAL NOTE:  The patient is a 73 year old gentleman who had a previous dental extraction performed 4 months ago and has had a chronic right-sided mucopurulent discharge  since that time.  He has been on several rounds of antibiotics including  clindamycin, Augmentin and Ceftin with persistent mucopurulent discharge.  He underwent a CT scan a month ago that demonstrated opacification of the right maxillary sinus as well as anterior right ethmoid region.  He is taken to the operating room at  this time for functional endoscopic sinus surgery with right maxillary ostial enlargement with irrigation of the maxillary sinus along with right anterior ethmoidectomy.  Of note, he has a septal deviation to the left, but really has no significant  trouble breathing and has no abnormal sinus disease on the left side.  DESCRIPTION OF PROCEDURE:  After adequate endotracheal anesthesia, the patient received 2 grams Ancef IV preoperatively.  Nose was prepped with Betadine solution and draped in sterile towels.  On exam, the patient had septal deviation to the left with a  septal spur on the left side.  On the right side, he had very edematous right middle meatus with edematous middle turbinate with a small amount of mucopurulent discharge.   The right side was injected with Xylocaine with epinephrine for hemostasis.  Afrin  packs were placed in the right side.  Using the endoscopes, the uncinate process was sharply incised and was removed.  Anterior ethmoid area was opened up with a microdebrider.  The maxillary sinus was identified with a curved suction and was enlarged  with a straight Thru-Cut forceps as well as backbiting forceps to approximately 1 cm size.  There is mucopurulent discharge within the maxillary sinus.  The right maxillary sinus was irrigated with approximately 100 mL of saline and final irrigation was  performed with bug juice.  This completed the procedure.  Of note, the posterior ethmoid cells which were partially opened were fairly clear.  Anterior ethmoid cells were very edematous.  Following anterior ethmoidectomy and maxillary ostial enlargement,  the nose was packed with Nasopore soaked in bacitracin ointment.  There was no significant bleeding noted.  The patient was awoken from anesthesia and transferred to recovery room postoperatively doing well.  DISPOSITION:  The patient will be discharged home later this morning on Keflex 500 mg b.i.d. for 10 days.  He can resume his CPAP for sleep apnea.  TN/NUANCE  D:04/30/2018 T:04/30/2018 JOB:[id number]

## 2018-04-30 NOTE — Anesthesia Procedure Notes (Signed)
Procedure Name: Intubation Date/Time: 04/30/2018 7:48 AM Performed by: Bryson Corona, CRNA Pre-anesthesia Checklist: Patient identified, Emergency Drugs available, Suction available and Patient being monitored Patient Re-evaluated:Patient Re-evaluated prior to induction Oxygen Delivery Method: Circle System Utilized Preoxygenation: Pre-oxygenation with 100% oxygen Induction Type: IV induction Ventilation: Mask ventilation without difficulty Laryngoscope Size: Mac and 4 Grade View: Grade I Tube type: Oral Tube size: 6.5 mm Number of attempts: 1 Airway Equipment and Method: Stylet and Oral airway Placement Confirmation: ETT inserted through vocal cords under direct vision,  positive ETCO2 and breath sounds checked- equal and bilateral Secured at: 22 cm Tube secured with: Tape Dental Injury: Teeth and Oropharynx as per pre-operative assessment

## 2018-04-30 NOTE — Discharge Instructions (Addendum)
Tylenol, ibuprofen or hydrocodone 5 mg 1-2 every 6 hrs jprn pain Keflex 500 mg bid for the next 10 days Use saline nasal rinse once or twice per day to reduce crusting Call office for follow up appt in 1 week    [phone]

## 2018-04-30 NOTE — Anesthesia Postprocedure Evaluation (Signed)
Anesthesia Post Note  Patient: Eddie Zimmerman  Procedure(s) Performed: ANTERIOR ETHMOIDECTOMY RIGHT SIDE (Right Nose) RIGHT MAXILLARY ANTROSTOMY WITH REMOVAL OF TISSUE (Right Nose) FUNCTIONAL ENDOSCOPIC SINUS SURGERY (Right Nose)     Patient location during evaluation: PACU Anesthesia Type: General Level of consciousness: awake and alert Pain management: pain level controlled Vital Signs Assessment: post-procedure vital signs reviewed and stable Respiratory status: spontaneous breathing, nonlabored ventilation, respiratory function stable and patient connected to nasal cannula oxygen Cardiovascular status: blood pressure returned to baseline and stable Postop Assessment: no apparent nausea or vomiting Anesthetic complications: no    Last Vitals:  Vitals:   04/30/18 0936 04/30/18 0944  BP: 133/79 128/74  Pulse: 71 66  Resp: (!) 25 20  Temp: (!) 36.2 C   SpO2: 96% 94%    Last Pain:  Vitals:   04/30/18 0944  TempSrc:   PainSc: 0-No pain                 Sharisse Rantz S

## 2018-04-30 NOTE — Interval H&P Note (Signed)
History and Physical Interval Note:  04/30/2018 7:36 AM  Eddie Zimmerman  has presented today for surgery, with the diagnosis of CHRONIC MAXILLARY [place]  The various methods of treatment have been discussed with the patient and family. After consideration of risks, benefits and other options for treatment, the patient has consented to  Procedure(s): ANTERIOR ETHMOIDECTOMY RIGHT SIDE (Right) RIGHT MAXILLARY ANTROSTOMY WITH REMOVAL OF TISSUE (Right) FUNCTIONAL ENDOSCOPIC SINUS SURGERY (Right) as a surgical intervention .  The patient's history has been reviewed, patient examined, no change in status, stable for surgery.  I have reviewed the patient's chart and labs.  Questions were answered to the patient's satisfaction.     [name]

## 2018-05-01 ENCOUNTER — Encounter (HOSPITAL_COMMUNITY): Payer: Self-pay | Admitting: Otolaryngology

## 2018-05-12 DIAGNOSIS — F330 Major depressive disorder, recurrent, mild: Secondary | ICD-10-CM | POA: Diagnosis not present

## 2018-05-12 DIAGNOSIS — I10 Essential (primary) hypertension: Secondary | ICD-10-CM | POA: Diagnosis not present

## 2018-05-12 DIAGNOSIS — M109 Gout, unspecified: Secondary | ICD-10-CM | POA: Diagnosis not present

## 2018-05-12 DIAGNOSIS — E1140 Type 2 diabetes mellitus with diabetic neuropathy, unspecified: Secondary | ICD-10-CM | POA: Diagnosis not present

## 2018-05-12 DIAGNOSIS — G4730 Sleep apnea, unspecified: Secondary | ICD-10-CM | POA: Diagnosis not present

## 2018-05-12 DIAGNOSIS — E7800 Pure hypercholesterolemia, unspecified: Secondary | ICD-10-CM | POA: Diagnosis not present

## 2018-05-20 DIAGNOSIS — E790 Hyperuricemia without signs of inflammatory arthritis and tophaceous disease: Secondary | ICD-10-CM | POA: Diagnosis not present

## 2018-05-31 ENCOUNTER — Other Ambulatory Visit: Payer: Self-pay | Admitting: Interventional Cardiology

## 2018-06-07 DIAGNOSIS — I480 Paroxysmal atrial fibrillation: Secondary | ICD-10-CM | POA: Diagnosis not present

## 2018-06-07 DIAGNOSIS — Z7901 Long term (current) use of anticoagulants: Secondary | ICD-10-CM | POA: Diagnosis not present

## 2018-06-07 DIAGNOSIS — E785 Hyperlipidemia, unspecified: Secondary | ICD-10-CM | POA: Diagnosis not present

## 2018-06-07 DIAGNOSIS — D170 Benign lipomatous neoplasm of skin and subcutaneous tissue of head, face and neck: Secondary | ICD-10-CM | POA: Diagnosis not present

## 2018-06-07 DIAGNOSIS — D1779 Benign lipomatous neoplasm of other sites: Secondary | ICD-10-CM | POA: Diagnosis not present

## 2018-06-07 DIAGNOSIS — Z9989 Dependence on other enabling machines and devices: Secondary | ICD-10-CM | POA: Diagnosis not present

## 2018-06-07 DIAGNOSIS — Z791 Long term (current) use of non-steroidal anti-inflammatories (NSAID): Secondary | ICD-10-CM | POA: Diagnosis not present

## 2018-06-07 DIAGNOSIS — Z87442 Personal history of urinary calculi: Secondary | ICD-10-CM | POA: Diagnosis not present

## 2018-06-07 DIAGNOSIS — I10 Essential (primary) hypertension: Secondary | ICD-10-CM | POA: Diagnosis not present

## 2018-06-07 DIAGNOSIS — M1990 Unspecified osteoarthritis, unspecified site: Secondary | ICD-10-CM | POA: Diagnosis not present

## 2018-06-07 DIAGNOSIS — D3161 Benign neoplasm of unspecified site of right orbit: Secondary | ICD-10-CM | POA: Diagnosis not present

## 2018-06-07 DIAGNOSIS — E119 Type 2 diabetes mellitus without complications: Secondary | ICD-10-CM | POA: Diagnosis not present

## 2018-06-07 DIAGNOSIS — G4733 Obstructive sleep apnea (adult) (pediatric): Secondary | ICD-10-CM | POA: Diagnosis not present

## 2018-06-07 DIAGNOSIS — M109 Gout, unspecified: Secondary | ICD-10-CM | POA: Diagnosis not present

## 2018-06-07 DIAGNOSIS — Z79899 Other long term (current) drug therapy: Secondary | ICD-10-CM | POA: Diagnosis not present

## 2018-06-07 DIAGNOSIS — H0589 Other disorders of orbit: Secondary | ICD-10-CM | POA: Diagnosis not present

## 2018-06-07 DIAGNOSIS — Z8673 Personal history of transient ischemic attack (TIA), and cerebral infarction without residual deficits: Secondary | ICD-10-CM | POA: Diagnosis not present

## 2018-06-17 DIAGNOSIS — Z48810 Encounter for surgical aftercare following surgery on the sense organs: Secondary | ICD-10-CM | POA: Diagnosis not present

## 2018-06-17 DIAGNOSIS — D1779 Benign lipomatous neoplasm of other sites: Secondary | ICD-10-CM | POA: Diagnosis not present

## 2018-06-20 ENCOUNTER — Other Ambulatory Visit: Payer: Self-pay | Admitting: Interventional Cardiology

## 2018-06-21 DIAGNOSIS — M0600 Rheumatoid arthritis without rheumatoid factor, unspecified site: Secondary | ICD-10-CM | POA: Diagnosis not present

## 2018-06-21 DIAGNOSIS — M25531 Pain in right wrist: Secondary | ICD-10-CM | POA: Diagnosis not present

## 2018-06-21 DIAGNOSIS — Z79899 Other long term (current) drug therapy: Secondary | ICD-10-CM | POA: Diagnosis not present

## 2018-06-21 DIAGNOSIS — M150 Primary generalized (osteo)arthritis: Secondary | ICD-10-CM | POA: Diagnosis not present

## 2018-06-21 DIAGNOSIS — E669 Obesity, unspecified: Secondary | ICD-10-CM | POA: Diagnosis not present

## 2018-06-21 DIAGNOSIS — M1009 Idiopathic gout, multiple sites: Secondary | ICD-10-CM | POA: Diagnosis not present

## 2018-06-21 DIAGNOSIS — Z6835 Body mass index (BMI) 35.0-35.9, adult: Secondary | ICD-10-CM | POA: Diagnosis not present

## 2018-07-07 DIAGNOSIS — N200 Calculus of kidney: Secondary | ICD-10-CM | POA: Diagnosis not present

## 2018-08-16 DIAGNOSIS — J0181 Other acute recurrent sinusitis: Secondary | ICD-10-CM | POA: Diagnosis not present

## 2018-08-27 DIAGNOSIS — X32XXXA Exposure to sunlight, initial encounter: Secondary | ICD-10-CM | POA: Diagnosis not present

## 2018-08-27 DIAGNOSIS — L570 Actinic keratosis: Secondary | ICD-10-CM | POA: Diagnosis not present

## 2018-08-27 DIAGNOSIS — D225 Melanocytic nevi of trunk: Secondary | ICD-10-CM | POA: Diagnosis not present

## 2018-08-27 DIAGNOSIS — Z1283 Encounter for screening for malignant neoplasm of skin: Secondary | ICD-10-CM | POA: Diagnosis not present

## 2018-08-29 ENCOUNTER — Other Ambulatory Visit: Payer: Self-pay | Admitting: Interventional Cardiology

## 2018-09-17 DIAGNOSIS — N200 Calculus of kidney: Secondary | ICD-10-CM | POA: Diagnosis not present

## 2018-09-24 DIAGNOSIS — J018 Other acute sinusitis: Secondary | ICD-10-CM | POA: Diagnosis not present

## 2018-10-07 DIAGNOSIS — M1009 Idiopathic gout, multiple sites: Secondary | ICD-10-CM | POA: Diagnosis not present

## 2018-10-07 DIAGNOSIS — M150 Primary generalized (osteo)arthritis: Secondary | ICD-10-CM | POA: Diagnosis not present

## 2018-10-07 DIAGNOSIS — Z79899 Other long term (current) drug therapy: Secondary | ICD-10-CM | POA: Diagnosis not present

## 2018-10-07 DIAGNOSIS — M0600 Rheumatoid arthritis without rheumatoid factor, unspecified site: Secondary | ICD-10-CM | POA: Diagnosis not present

## 2018-11-04 DIAGNOSIS — M19071 Primary osteoarthritis, right ankle and foot: Secondary | ICD-10-CM | POA: Diagnosis not present

## 2018-11-04 DIAGNOSIS — M76821 Posterior tibial tendinitis, right leg: Secondary | ICD-10-CM | POA: Diagnosis not present

## 2018-11-11 DIAGNOSIS — E1140 Type 2 diabetes mellitus with diabetic neuropathy, unspecified: Secondary | ICD-10-CM | POA: Diagnosis not present

## 2018-11-11 DIAGNOSIS — F329 Major depressive disorder, single episode, unspecified: Secondary | ICD-10-CM | POA: Diagnosis not present

## 2018-11-11 DIAGNOSIS — E7800 Pure hypercholesterolemia, unspecified: Secondary | ICD-10-CM | POA: Diagnosis not present

## 2018-11-11 DIAGNOSIS — G4730 Sleep apnea, unspecified: Secondary | ICD-10-CM | POA: Diagnosis not present

## 2018-11-11 DIAGNOSIS — I480 Paroxysmal atrial fibrillation: Secondary | ICD-10-CM | POA: Diagnosis not present

## 2018-11-11 DIAGNOSIS — I10 Essential (primary) hypertension: Secondary | ICD-10-CM | POA: Diagnosis not present

## 2018-11-11 DIAGNOSIS — Z1389 Encounter for screening for other disorder: Secondary | ICD-10-CM | POA: Diagnosis not present

## 2018-11-11 DIAGNOSIS — I639 Cerebral infarction, unspecified: Secondary | ICD-10-CM | POA: Diagnosis not present

## 2018-11-11 DIAGNOSIS — M109 Gout, unspecified: Secondary | ICD-10-CM | POA: Diagnosis not present

## 2018-11-11 DIAGNOSIS — Z0000 Encounter for general adult medical examination without abnormal findings: Secondary | ICD-10-CM | POA: Diagnosis not present

## 2018-11-15 DIAGNOSIS — M76821 Posterior tibial tendinitis, right leg: Secondary | ICD-10-CM | POA: Diagnosis not present

## 2018-11-15 DIAGNOSIS — M71571 Other bursitis, not elsewhere classified, right ankle and foot: Secondary | ICD-10-CM | POA: Diagnosis not present

## 2018-11-26 ENCOUNTER — Other Ambulatory Visit: Payer: Self-pay | Admitting: Interventional Cardiology

## 2018-12-07 DIAGNOSIS — H25813 Combined forms of age-related cataract, bilateral: Secondary | ICD-10-CM | POA: Diagnosis not present

## 2018-12-07 DIAGNOSIS — H524 Presbyopia: Secondary | ICD-10-CM | POA: Diagnosis not present

## 2018-12-07 DIAGNOSIS — H52223 Regular astigmatism, bilateral: Secondary | ICD-10-CM | POA: Diagnosis not present

## 2018-12-07 DIAGNOSIS — E119 Type 2 diabetes mellitus without complications: Secondary | ICD-10-CM | POA: Diagnosis not present

## 2018-12-07 DIAGNOSIS — H11441 Conjunctival cysts, right eye: Secondary | ICD-10-CM | POA: Diagnosis not present

## 2018-12-07 DIAGNOSIS — H5213 Myopia, bilateral: Secondary | ICD-10-CM | POA: Diagnosis not present

## 2018-12-07 DIAGNOSIS — H0520 Unspecified exophthalmos: Secondary | ICD-10-CM | POA: Diagnosis not present

## 2018-12-09 DIAGNOSIS — Z9889 Other specified postprocedural states: Secondary | ICD-10-CM | POA: Diagnosis not present

## 2018-12-09 DIAGNOSIS — D3161 Benign neoplasm of unspecified site of right orbit: Secondary | ICD-10-CM | POA: Diagnosis not present

## 2018-12-24 ENCOUNTER — Encounter: Payer: Self-pay | Admitting: Interventional Cardiology

## 2018-12-27 NOTE — Progress Notes (Signed)
Cardiology Office Note   Date:  12/28/2018   ID:  Eddie Zimmerman, DOB [birth year], MRN [id number]  PCP:  [name], MD    No chief complaint on file.  PAF  Wt Readings from Last 3 Encounters:  12/28/18 236 lb 12.8 oz (107.4 kg)  04/20/18 248 lb (112.5 kg)  12/16/17 251 lb (113.9 kg)       History of Present Illness: Eddie Zimmerman is a 73 y.o. male  with a h/o hyperlipidemia. He had a h/o obesity s/p lap band surgery and has lost about 60+ lbs.   He hashas OSA.  He had a cryptogenic stroke with ILR placed 3/16. Brief episode of asymptomatic Afib was noted in 8/16 and pt was referred the Afib clinic to discuss anticoagulation. He was started on xarelto due to the strokes.  His LINQ monitorwas removed in 2018.  In the past,I noted: "He is walking some, more the elliptical 25-30 minutes a few times a week. He does some weights as well, so he gets over 150 minutes a week."  Since the last visit, he had a nasal surgery to help with some drainage.  He had a staph infection in his nose.  He has been on multiple antibiotics but the infection did not clear.  He wanted to see a different ENT.   Denies : Chest pain. Dizziness. Leg edema. Nitroglycerin use. Orthopnea. Palpitations. Paroxysmal nocturnal dyspnea. Shortness of breath. Syncope.   He walks now for 50-60 minutes. He is back to the gym as well.  Past Medical History:  Diagnosis Date  . Acute ischemic left MCA stroke ([place]) 08/21/2014  . Aphasia   . Arthritis    hands, wrist & Knee   . Chronic diastolic heart failure ([place]) 06/08/2014  . Cryptogenic stroke ([place]) 06/29/2014  . CVA (cerebral vascular accident) ([place]) 06/14/2014  . Depression   . Diabetes ([place]) 08/21/2014  . Diabetes mellitus without complication ([place])   . Dysrhythmia    ? Atrial fibrillation episode" Loop Recorder" implanted left chest- Carilyn [place] follows  . Essential hypertension, benign 04/07/2013  . Gout   . Hearing impaired person,  bilateral    hearing aids used  . History of kidney stones LAST MAY 2015  . HLD (hyperlipidemia) 08/21/2014  . Hx of laparoscopic gastric banding    remains in place  . Hypertension   . Hypertensive heart disease 03/17/2016  . Hypertrophic obstructive cardiomyopathy ([place]) 04/07/2013  . Obstructive sleep apnea 04/07/2013  . PAF (paroxysmal atrial fibrillation) ([place]) 06/14/2015  . Sleep apnea    cpap use, SETTING OF 13  . Slurred speech 06/08/2014  . Stroke [facility]) 06-07-2014    Past Surgical History:  Procedure Laterality Date  . ARTHROSCOPIC REPAIR PCL Bilateral    right- x1, left- x3   . COLONOSCOPY WITH PROPOFOL N/A 10/24/2014   Procedure: COLONOSCOPY WITH PROPOFOL;  Surgeon: Garlan Fair, MD;  Location: [facility] ENDOSCOPY;  Service: Endoscopy;  Laterality: N/A;  . CYSTOSCOPY WITH RETROGRADE PYELOGRAM, URETEROSCOPY AND STENT PLACEMENT Right 01/31/2016   Procedure: CYSTOSCOPY WITH RETROGRADE PYELOGRAM, URETEROSCOPY AND STENT PLACEMENT;  Surgeon: [name], MD;  Location: [facility] ORS;  Service: Urology;  Laterality: Right;  . CYSTOSCOPY WITH URETHRAL DILATATION N/A 01/31/2016   Procedure: CYSTOSCOPY WITH URETHRAL BALLOON DILATATION;  Surgeon: [name], MD;  Location: [facility] ORS;  Service: Urology;  Laterality: N/A;  . CYSTOSCOPY/URETEROSCOPY/HOLMIUM LASER/STENT PLACEMENT Bilateral 04/14/2016   Procedure: CYSTOSCOPY/URETEROSCOPY   RETROGRADE PYELOGRAM;  Surgeon: [name], MD;  Location:  [facility] ORS;  Service: Urology;  Laterality: Bilateral;  . ETHMOIDECTOMY Right 04/30/2018   Procedure: ANTERIOR ETHMOIDECTOMY RIGHT SIDE;  Surgeon: Rozetta Nunnery, MD;  Location: [place];  Service: ENT;  Laterality: Right;  . HERNIA REPAIR     abdominal  . HOLMIUM LASER APPLICATION Right [id number]   Procedure: HOLMIUM LASER APPLICATION;  Surgeon: [name], MD;  Location: [facility] ORS;  Service: Urology;  Laterality: Right;  . JOINT REPLACEMENT Left   . KNEE SURGERY Left   . LAPAROSCOPIC GASTRIC BANDING     . LOOP RECORDER IMPLANT N/A 07/12/2014   Procedure: LOOP RECORDER IMPLANT;  Surgeon: Evans Lance, MD;  Location: [facility] CATH LAB;  Service: Cardiovascular;  Laterality: N/A;  . LOOP RECORDER REMOVAL N/A 08/05/2016   Procedure: Loop Recorder Removal;  Surgeon: Evans Lance, MD;  Location: [place] CV LAB;  Service: Cardiovascular;  Laterality: N/A;  . MAXILLARY ANTROSTOMY Right 04/30/2018   Procedure: RIGHT MAXILLARY ANTROSTOMY WITH REMOVAL OF TISSUE;  Surgeon: Rozetta Nunnery, MD;  Location: [place];  Service: ENT;  Laterality: Right;  . NASAL SINUS SURGERY Right 04/30/2018   Procedure: FUNCTIONAL ENDOSCOPIC SINUS SURGERY;  Surgeon: Rozetta Nunnery, MD;  Location[facility];  Service: ENT;  Laterality: Right;  . TARSAL TUNNEL RELEASE Bilateral 2005  . TEE WITHOUT CARDIOVERSION N/A 06/14/2014   Procedure: TRANSESOPHAGEAL ECHOCARDIOGRAM (TEE);  Surgeon: Sueanne Margarita, MD;  Location: [facility] ENDOSCOPY;  Service: Cardiovascular;  Laterality: N/A;  . TONSILLECTOMY    . TOTAL KNEE ARTHROPLASTY Right 06/07/2014   Procedure: TOTAL KNEE ARTHROPLASTY;  Surgeon: Kerin Salen, MD;  Location: [place];  Service: Orthopedics;  Laterality: Right;     Current Outpatient Medications  Medication Sig Dispense Refill  . allopurinol (ZYLOPRIM) 100 MG tablet Take 100 mg by mouth every morning.    [name] allopurinol (ZYLOPRIM) 300 MG tablet Take 300 mg by mouth every morning.     [name] amoxicillin (AMOXIL) 500 MG capsule Take 2,000 mg by mouth See admin instructions. Take 4 capsules (2000 mg) by mouth one hour prior to dental procedure    . Coenzyme Q10 (COQ-10) 200 MG CAPS Take 200 mg by mouth every morning.    . Cyanocobalamin (VITAMIN B-12 PO) Take 1 tablet by mouth every morning.    . escitalopram (LEXAPRO) 20 MG tablet Take 20 mg by mouth every morning.     . hydrochlorothiazide (MICROZIDE) 12.5 MG capsule Take 12.5 mg by mouth every morning.     . hydroxychloroquine (PLAQUENIL) 200 MG tablet Take 200 mg by mouth 2  (two) times daily.    [name] lisinopril (PRINIVIL,ZESTRIL) 40 MG tablet TAKE ONE-HALF (1/2) TABLET TWICE A DAY (INCREASE IN DOSE ON NEXT REFILL) 90 tablet 1  . metFORMIN (GLUCOPHAGE) 1000 MG tablet Take 1,000 mg by mouth 2 (two) times daily.    . Multiple Vitamin (MULTIVITAMIN WITH MINERALS) TABS tablet Take 1 tablet by mouth every morning. Men's One a Day     . Polyvinyl Alcohol-Povidone (CLEAR EYES NATURAL TEARS OP) Place 1 drop into both eyes 2 (two) times daily as needed (dry eyes).    . pregabalin (LYRICA) 75 MG capsule Take 75 mg by mouth 2 (two) times daily.     [name] PRESCRIPTION MEDICATION Inhale into the lungs at bedtime. CPAP    . rosuvastatin (CRESTOR) 5 MG tablet Take 1 tablet (5 mg total) by mouth daily. Please make yearly appt with Dr. Irish Lack for August for future refills. 1st attempt 30 tablet 0  .  XARELTO 20 MG TABS tablet TAKE 1 TABLET DAILY WITH SUPPER 90 tablet 3   No current facility-administered medications for this visit.     Allergies:   Patient has no known allergies.    Social History:  The patient  reports that he has never smoked. He has never used smokeless tobacco. He reports current alcohol use. He reports that he does not use drugs.   Family History:  The patient's family history includes AAA (abdominal aortic aneurysm) in his brother and mother; Alzheimer's disease in his father; Cirrhosis in his brother; Hypertension in his brother and mother.    ROS:  Please see the history of present illness.   Otherwise, review of systems are positive for nasal drainage.   All other systems are reviewed and negative.    PHYSICAL EXAM: VS:  BP 110/74   Pulse 62   Ht 5\' 10"  (1.778 m)   Wt 236 lb 12.8 oz (107.4 kg)   SpO2 95%   BMI 33.98 kg/m  , BMI Body mass index is 33.98 kg/m. GEN: Well nourished, well developed, in no acute distress  HEENT: normal  Neck: no JVD, carotid bruits, or masses Cardiac: RRR; no murmurs, rubs, or gallops,no edema  Respiratory:  clear to  auscultation bilaterally, normal work of breathing GI: soft, nontender, nondistended, + BS MS: no deformity or atrophy  Skin: warm and dry, no rash Neuro:  Strength and sensation are intact Psych: euthymic mood, full affect   EKG:   The ekg ordered today demonstrates NSR, nonspecific ST changes, no change from prior   Recent Labs: 04/20/2018: BUN 15; Creatinine, Ser 1.15; Hemoglobin 13.8; Platelets 211; Potassium 4.1; Sodium 139   Lipid Panel    Component Value Date/Time   CHOL 161 04/02/2016 0917   TRIG 179 (H) 04/02/2016 0917   HDL 36 (L) 04/02/2016 0917   CHOLHDL 4.5 04/02/2016 0917   VLDL 36 (H) 04/02/2016 0917   LDLCALC 89 04/02/2016 0917     Other studies Reviewed: Additional studies/ records that were reviewed today with results demonstrating: labs reviewed. 2016 echo: Left ventricle: The cavity size was normal. Wall thickness was  normal. Systolic function was normal. The estimated ejection  fraction was in the range of 55% to 60%. Wall motion was normal;  there were no regional wall motion abnormalities.   ASSESSMENT AND PLAN:  1. PAF: in NSR.  No bleeding with Xarelto.  Hbg 14 in 10/2018. 2. Hypertensive heart disease: The current medical regimen is effective;  continue present plan and medications. 3. Hyperlipidemia: LDL 70 in 10/2018. 4. DM: A1C 7.0. Continue metformin.  Blood sugars in the 120s typically.  5. Obesity: Lost 12 lbs in the last year. S/p weight loss surgery.  6. Negative stress test in 2010.     Current medicines are reviewed at length with the patient today.  The patient concerns regarding his medicines were addressed.  The following changes have been made:  No change  Labs/ tests ordered today include:  No orders of the defined types were placed in this encounter.   Recommend 150 minutes/week of aerobic exercise Low fat, low carb, high fiber diet recommended  Disposition:   FU in 1 year   Signed, Larae Grooms, MD   12/28/2018 4:21 PM    [facility] [address], [place], [place]  [address] Phone: [phone]; Fax: [phone]

## 2018-12-27 NOTE — Telephone Encounter (Signed)
Called and apologized to the patient for the many schedule changes due to COVID. Offered patient appt with Dr. Irish Lack in the office tomorrow at 4:00 PM and he accepted.

## 2018-12-28 ENCOUNTER — Ambulatory Visit (INDEPENDENT_AMBULATORY_CARE_PROVIDER_SITE_OTHER): Payer: Medicare Other | Admitting: Interventional Cardiology

## 2018-12-28 ENCOUNTER — Encounter: Payer: Self-pay | Admitting: Interventional Cardiology

## 2018-12-28 ENCOUNTER — Other Ambulatory Visit: Payer: Self-pay

## 2018-12-28 VITALS — BP 110/74 | HR 62 | Ht 70.0 in | Wt 236.8 lb

## 2018-12-28 DIAGNOSIS — E782 Mixed hyperlipidemia: Secondary | ICD-10-CM | POA: Diagnosis not present

## 2018-12-28 DIAGNOSIS — I480 Paroxysmal atrial fibrillation: Secondary | ICD-10-CM | POA: Diagnosis not present

## 2018-12-28 DIAGNOSIS — E669 Obesity, unspecified: Secondary | ICD-10-CM | POA: Diagnosis not present

## 2018-12-28 DIAGNOSIS — J3489 Other specified disorders of nose and nasal sinuses: Secondary | ICD-10-CM

## 2018-12-28 DIAGNOSIS — I119 Hypertensive heart disease without heart failure: Secondary | ICD-10-CM | POA: Diagnosis not present

## 2018-12-28 NOTE — Patient Instructions (Signed)
Medication Instructions:  Your physician recommends that you continue on your current medications as directed. Please refer to the Current Medication list given to you today.  If you need a refill on your cardiac medications before your next appointment, please call your pharmacy.   Lab work: None Ordered  If you have labs (blood work) drawn today and your tests are completely normal, you will receive your results only by: [name] MyChart Message (if you have MyChart) OR . A paper copy in the mail If you have any lab test that is abnormal or we need to change your treatment, we will call you to review the results.  Testing/Procedures: None ordered  Follow-Up:  You have been referred to ENT (Dr. Benjamine Mola)  At [facility], you and your health needs are our priority.  As part of our continuing mission to provide you with exceptional heart care, we have created designated Provider Care Teams.  These Care Teams include your primary Cardiologist (physician) and Advanced Practice Providers (APPs -  Physician Assistants and Nurse Practitioners) who all work together to provide you with the care you need, when you need it. . You will need a follow up appointment in 1 year.  Please call our office 2 months in advance to schedule this appointment.  You may see Casandra Doffing, MD or one of the following Advanced Practice Providers on your designated Care Team:   . Lyda Jester, PA-C . Dayna Dunn, PA-C . Ermalinda Barrios, PA-C  Any Other Special Instructions Will Be Listed Below (If Applicable).

## 2018-12-30 ENCOUNTER — Ambulatory Visit: Payer: Medicare Other | Admitting: Cardiology

## 2019-01-04 DIAGNOSIS — H1132 Conjunctival hemorrhage, left eye: Secondary | ICD-10-CM | POA: Diagnosis not present

## 2019-01-05 ENCOUNTER — Other Ambulatory Visit: Payer: Self-pay | Admitting: Interventional Cardiology

## 2019-01-14 ENCOUNTER — Ambulatory Visit: Payer: Medicare Other | Admitting: Interventional Cardiology

## 2019-02-25 DIAGNOSIS — J322 Chronic ethmoidal sinusitis: Secondary | ICD-10-CM | POA: Diagnosis not present

## 2019-02-25 DIAGNOSIS — J320 Chronic maxillary sinusitis: Secondary | ICD-10-CM | POA: Diagnosis not present

## 2019-03-30 DIAGNOSIS — J342 Deviated nasal septum: Secondary | ICD-10-CM | POA: Diagnosis not present

## 2019-03-30 DIAGNOSIS — J310 Chronic rhinitis: Secondary | ICD-10-CM | POA: Diagnosis not present

## 2019-05-16 DIAGNOSIS — E7800 Pure hypercholesterolemia, unspecified: Secondary | ICD-10-CM | POA: Diagnosis not present

## 2019-05-16 DIAGNOSIS — I10 Essential (primary) hypertension: Secondary | ICD-10-CM | POA: Diagnosis not present

## 2019-05-16 DIAGNOSIS — E1140 Type 2 diabetes mellitus with diabetic neuropathy, unspecified: Secondary | ICD-10-CM | POA: Diagnosis not present

## 2019-05-16 DIAGNOSIS — I639 Cerebral infarction, unspecified: Secondary | ICD-10-CM | POA: Diagnosis not present

## 2019-05-16 DIAGNOSIS — F329 Major depressive disorder, single episode, unspecified: Secondary | ICD-10-CM | POA: Diagnosis not present

## 2019-05-16 DIAGNOSIS — I480 Paroxysmal atrial fibrillation: Secondary | ICD-10-CM | POA: Diagnosis not present

## 2019-06-01 DIAGNOSIS — J320 Chronic maxillary sinusitis: Secondary | ICD-10-CM | POA: Diagnosis not present

## 2019-06-01 DIAGNOSIS — J322 Chronic ethmoidal sinusitis: Secondary | ICD-10-CM | POA: Diagnosis not present

## 2019-06-20 DIAGNOSIS — M0600 Rheumatoid arthritis without rheumatoid factor, unspecified site: Secondary | ICD-10-CM | POA: Diagnosis not present

## 2019-06-20 DIAGNOSIS — M1009 Idiopathic gout, multiple sites: Secondary | ICD-10-CM | POA: Diagnosis not present

## 2019-08-24 ENCOUNTER — Other Ambulatory Visit: Payer: Self-pay | Admitting: Interventional Cardiology

## 2019-08-24 NOTE — Telephone Encounter (Signed)
Xarelto 20mg  refill request received. Pt is 74 years old, weight-107.4kg, Crea-1.16 on 06/20/2019 via [facility] from [place], last seen by Dr. Irish Lack on 12/28/2018, Diagnosis-Afib, CrCl-88.33ml/min; Dose is appropriate based on dosing criteria. Will send in refill to requested pharmacy.

## 2019-09-06 DIAGNOSIS — M5441 Lumbago with sciatica, right side: Secondary | ICD-10-CM | POA: Diagnosis not present

## 2019-09-06 DIAGNOSIS — M5440 Lumbago with sciatica, unspecified side: Secondary | ICD-10-CM | POA: Diagnosis not present

## 2019-09-06 DIAGNOSIS — Z79899 Other long term (current) drug therapy: Secondary | ICD-10-CM | POA: Diagnosis not present

## 2019-09-30 DIAGNOSIS — J322 Chronic ethmoidal sinusitis: Secondary | ICD-10-CM | POA: Diagnosis not present

## 2019-09-30 DIAGNOSIS — J320 Chronic maxillary sinusitis: Secondary | ICD-10-CM | POA: Diagnosis not present

## 2019-10-06 DIAGNOSIS — E669 Obesity, unspecified: Secondary | ICD-10-CM | POA: Diagnosis not present

## 2019-10-06 DIAGNOSIS — M0600 Rheumatoid arthritis without rheumatoid factor, unspecified site: Secondary | ICD-10-CM | POA: Diagnosis not present

## 2019-10-06 DIAGNOSIS — M150 Primary generalized (osteo)arthritis: Secondary | ICD-10-CM | POA: Diagnosis not present

## 2019-10-06 DIAGNOSIS — Z6833 Body mass index (BMI) 33.0-33.9, adult: Secondary | ICD-10-CM | POA: Diagnosis not present

## 2019-10-06 DIAGNOSIS — M1009 Idiopathic gout, multiple sites: Secondary | ICD-10-CM | POA: Diagnosis not present

## 2019-10-06 DIAGNOSIS — Z79899 Other long term (current) drug therapy: Secondary | ICD-10-CM | POA: Diagnosis not present

## 2019-10-07 ENCOUNTER — Other Ambulatory Visit: Payer: Self-pay | Admitting: Interventional Cardiology

## 2019-10-09 ENCOUNTER — Encounter (HOSPITAL_COMMUNITY): Payer: Self-pay | Admitting: Emergency Medicine

## 2019-10-09 ENCOUNTER — Other Ambulatory Visit: Payer: Self-pay | Admitting: Interventional Cardiology

## 2019-10-09 ENCOUNTER — Emergency Department (HOSPITAL_BASED_OUTPATIENT_CLINIC_OR_DEPARTMENT_OTHER): Payer: Medicare Other

## 2019-10-09 ENCOUNTER — Other Ambulatory Visit: Payer: Self-pay

## 2019-10-09 ENCOUNTER — Emergency Department (HOSPITAL_COMMUNITY)
Admission: EM | Admit: 2019-10-09 | Discharge: 2019-10-09 | Disposition: A | Discharge status: Home / Self Care | Payer: Medicare Other | Attending: Emergency Medicine | Admitting: Emergency Medicine

## 2019-10-09 DIAGNOSIS — Z792 Long term (current) use of antibiotics: Secondary | ICD-10-CM | POA: Diagnosis not present

## 2019-10-09 DIAGNOSIS — S8012XA Contusion of left lower leg, initial encounter: Secondary | ICD-10-CM | POA: Insufficient documentation

## 2019-10-09 DIAGNOSIS — E119 Type 2 diabetes mellitus without complications: Secondary | ICD-10-CM | POA: Insufficient documentation

## 2019-10-09 DIAGNOSIS — M79605 Pain in left leg: Secondary | ICD-10-CM

## 2019-10-09 DIAGNOSIS — Z79899 Other long term (current) drug therapy: Secondary | ICD-10-CM | POA: Diagnosis not present

## 2019-10-09 DIAGNOSIS — I5032 Chronic diastolic (congestive) heart failure: Secondary | ICD-10-CM | POA: Diagnosis not present

## 2019-10-09 DIAGNOSIS — Z7984 Long term (current) use of oral hypoglycemic drugs: Secondary | ICD-10-CM | POA: Diagnosis not present

## 2019-10-09 DIAGNOSIS — I110 Hypertensive heart disease with heart failure: Secondary | ICD-10-CM | POA: Diagnosis not present

## 2019-10-09 DIAGNOSIS — Y929 Unspecified place or not applicable: Secondary | ICD-10-CM | POA: Diagnosis not present

## 2019-10-09 DIAGNOSIS — I480 Paroxysmal atrial fibrillation: Secondary | ICD-10-CM | POA: Diagnosis not present

## 2019-10-09 DIAGNOSIS — R2242 Localized swelling, mass and lump, left lower limb: Secondary | ICD-10-CM | POA: Diagnosis not present

## 2019-10-09 DIAGNOSIS — Z96651 Presence of right artificial knee joint: Secondary | ICD-10-CM | POA: Insufficient documentation

## 2019-10-09 DIAGNOSIS — Z7901 Long term (current) use of anticoagulants: Secondary | ICD-10-CM | POA: Diagnosis not present

## 2019-10-09 DIAGNOSIS — I421 Obstructive hypertrophic cardiomyopathy: Secondary | ICD-10-CM | POA: Diagnosis not present

## 2019-10-09 DIAGNOSIS — Y999 Unspecified external cause status: Secondary | ICD-10-CM | POA: Insufficient documentation

## 2019-10-09 DIAGNOSIS — Y939 Activity, unspecified: Secondary | ICD-10-CM | POA: Insufficient documentation

## 2019-10-09 DIAGNOSIS — X58XXXA Exposure to other specified factors, initial encounter: Secondary | ICD-10-CM | POA: Insufficient documentation

## 2019-10-09 DIAGNOSIS — M79662 Pain in left lower leg: Secondary | ICD-10-CM | POA: Diagnosis present

## 2019-10-09 NOTE — Progress Notes (Signed)
Venous duplex       has been completed. Preliminary results can be found under CV proc through chart review. Naydelin Ziegler, BS, RDMS, RVT   

## 2019-10-09 NOTE — Discharge Instructions (Addendum)
You have been seen today for leg pain. Please read and follow all provided instructions. Return to the emergency room for worsening condition or new concerning symptoms.    Your ultrasound today does not show a blood clot in your leg.  The knot we feel appears to be in your soft tissue.   1. Medications:  Recommend tylenol for pain as needed.  Continue usual home medications Take medications as prescribed. Please review all of the medicines and only take them if you do not have an allergy to them.   2. Treatment: rest, drink plenty of fluids  3. Follow Up:  Please follow up with primary care provider by scheduling an appointment as soon as possible for a visit to further have the knot in your leg evaluated.    ?

## 2019-10-09 NOTE — ED Triage Notes (Signed)
Pt c/o left calf pain, bruising to leg and knot since last MOnday. Denies any falls or injuries. Madilyn Hook.

## 2019-10-09 NOTE — ED Provider Notes (Signed)
[facility] DEPT Provider Note   CSN: [id number] Arrival date & time: 10/09/19  1045     History Chief Complaint  Patient presents with  . Leg Pain    Eddie Zimmerman is a 74 y.o. male with past medical history significant for acute ischemic left MCA stroke, aphasia, type 2 diabetes, essential hypertension, hypertrophic obstructive cardiomyopathy, paroxysmal A. Fib on xarelto.  HPI Patient is presenting to emergency department today with chief complaint of left calf pain x6 days.  Patient denies any injury to his leg.  He noticed that his calf was swollen and discolored when he woke up day of symptom onset.  He is describing the pain as an aching sensation.  He feels a knot with deep palpation of the calf.  He states the pain is worse with ambulation.  He is able to walk and bear weight on left lower extremity.  He has not taken any pain medications prior to arrival.  He denies missing any doses of his Xarelto.  He denies any history of PE or DVT.  Patient is very active, he works out daily. Also denies fever, chills, cough, hemoptysis, chest pain, shortness of breath, palpitations, syncope, numbness, weakness or decrease sensation in the left leg.  Patient denies any recent travel or long period of immobilization, recent surgery, family or personal history of bleeding or clotting disorders.   Past Medical History:  Diagnosis Date  . Acute ischemic left MCA stroke ([place]) 08/21/2014  . Aphasia   . Arthritis    hands, wrist & Knee   . Chronic diastolic heart failure ([place]) 06/08/2014  . Cryptogenic stroke ([place]) 06/29/2014  . CVA (cerebral vascular accident) ([place]) 06/14/2014  . Depression   . Diabetes ([place]) 08/21/2014  . Diabetes mellitus without complication ([place])   . Dysrhythmia    ? Atrial fibrillation episode" Loop Recorder" implanted left chest- [place] follows  . Essential hypertension, benign 04/07/2013  . Gout   . Hearing impaired person, bilateral     hearing aids used  . History of kidney stones LAST MAY 2015  . HLD (hyperlipidemia) 08/21/2014  . Hx of laparoscopic gastric banding    remains in place  . Hypertension   . Hypertensive heart disease 03/17/2016  . Hypertrophic obstructive cardiomyopathy ([place]) 04/07/2013  . Obstructive sleep apnea 04/07/2013  . PAF (paroxysmal atrial fibrillation) ([place]) 06/14/2015  . Sleep apnea    cpap use, SETTING OF 13  . Slurred speech 06/08/2014  . Stroke [facility]) 06-07-2014    Patient Active Problem List   Diagnosis Date Noted  . Hypertensive heart disease 03/17/2016  . PAF (paroxysmal atrial fibrillation) ([place]) 06/14/2015  . Acute ischemic left MCA stroke ([place]) 08/21/2014  . Morbid obesity ([place]) 08/21/2014  . HLD (hyperlipidemia) 08/21/2014  . Diabetes ([place]) 08/21/2014  . Cryptogenic stroke ([place]) 06/29/2014  . CVA (cerebral vascular accident) ([place]) 06/14/2014  . Aphasia   . Chronic diastolic heart failure ([place]) 06/08/2014  . Obesity (BMI 30-39.9) 06/08/2014  . Diabetes mellitus without complication ([place]) 67/54/4920  . Hypertension 06/08/2014  . Slurred speech 06/08/2014  . Primary osteoarthritis of right knee 06/07/2014  . Arthritis of knee 06/07/2014  . Preoperative cardiovascular examination 04/10/2014  . Hypertrophic obstructive cardiomyopathy ([place]) 04/07/2013  . Mixed hyperlipidemia 04/07/2013  . Essential hypertension, benign 04/07/2013  . Obstructive sleep apnea 04/07/2013    Past Surgical History:  Procedure Laterality Date  . ARTHROSCOPIC REPAIR PCL Bilateral    right- x1, left- x3   . COLONOSCOPY  WITH PROPOFOL N/A 10/24/2014   Procedure: COLONOSCOPY WITH PROPOFOL;  Surgeon: Garlan Fair, MD;  Location: [facility];  Service: Endoscopy;  Laterality: N/A;  . CYSTOSCOPY WITH RETROGRADE PYELOGRAM, URETEROSCOPY AND STENT PLACEMENT Right 01/31/2016   Procedure: CYSTOSCOPY WITH RETROGRADE PYELOGRAM, URETEROSCOPY AND STENT PLACEMENT;  Surgeon: [name], MD;  Location: [facility] ORS;   Service: Urology;  Laterality: Right;  . CYSTOSCOPY WITH URETHRAL DILATATION N/A 01/31/2016   Procedure: CYSTOSCOPY WITH URETHRAL BALLOON DILATATION;  Surgeon: [name], MD;  Location: [facility];  Service: Urology;  Laterality: N/A;  . CYSTOSCOPY/URETEROSCOPY/HOLMIUM LASER/STENT PLACEMENT Bilateral 04/14/2016   Procedure: CYSTOSCOPY/URETEROSCOPY   RETROGRADE PYELOGRAM;  Surgeon: [name], MD;  Location: [facility];  Service: Urology;  Laterality: Bilateral;  . ETHMOIDECTOMY Right 04/30/2018   Procedure: ANTERIOR ETHMOIDECTOMY RIGHT SIDE;  Surgeon: Rozetta Nunnery, MD;  Location: [facility];  Service: ENT;  Laterality: Right;  . HERNIA REPAIR     abdominal  . HOLMIUM LASER APPLICATION Right 29/12/2424   Procedure: HOLMIUM LASER APPLICATION;  Surgeon: [name], MD;  Location: [facility];  Service: Urology;  Laterality: Right;  . JOINT REPLACEMENT Left   . KNEE SURGERY Left   . LAPAROSCOPIC GASTRIC BANDING    . LOOP RECORDER IMPLANT N/A 07/12/2014   Procedure: LOOP RECORDER IMPLANT;  Surgeon: Evans Lance, MD;  [facility] C[facility] LAB;  Service: Cardiovascular;  Laterality: N/A;  . LOOP RECORDER REMOVAL N/A 08/05/2016   Procedure: Loop Recorder Removal;  Surgeon: Evans Lance, MD;  Location: [facility] CV LAB;  Service: Cardiovascular;  Laterality: N/A;  . MAXILLARY ANTROSTOMY Right 04/30/2018   Procedure: RIGHT MAXILLARY ANTROSTOMY WITH REMOVAL OF TISSUE;  Surgeon: Rozetta Nunnery, MD;  Location: [facility];  Service: ENT;  Laterality: Right;  . NASAL SINUS SURGERY Right 04/30/2018   Procedure: FUNCTIONAL ENDOSCOPIC SINUS SURGERY;  Surgeon: Rozetta Nunnery, MD;  Location: [facility] OR;  Service: ENT;  Laterality: Right;  . TARSAL TUNNEL RELEASE Bilateral 2005  . TEE WITHOUT CARDIOVERSION N/A 06/14/2014   Procedure: TRANSESOPHAGEAL ECHOCARDIOGRAM (TEE);  Surgeon: Sueanne Margarita, MD;  Location: [facility] ENDOSCOPY;  Service: Cardiovascular;  Laterality: N/A;  . TONSILLECTOMY    . TOTAL KNEE ARTHROPLASTY  Right 06/07/2014   Procedure: TOTAL KNEE ARTHROPLASTY;  Surgeon: Kerin Salen, MD;  Location: [facility];  Service: Orthopedics;  Laterality: Right;       Family History  Problem Relation Age of Onset  . AAA (abdominal aortic aneurysm) Mother   . Hypertension Mother   . Alzheimer's disease Father   . Cirrhosis Brother        of the liver  . AAA (abdominal aortic aneurysm) Brother   . Hypertension Brother   . Heart attack Neg Hx     Social History   Tobacco Use  . Smoking status: Never Smoker  . Smokeless tobacco: Never Used  Vaping Use  . Vaping Use: Never used  Substance Use Topics  . Alcohol use: Yes    Alcohol/week: 0.0 standard drinks    Comment: rare 1 PER month  . Drug use: No    Home Medications Prior to Admission medications   Medication Sig Start Date End Date Taking? Authorizing Provider  allopurinol (ZYLOPRIM) 100 MG tablet Take 100 mg by mouth every morning.    [provider]  allopurinol (ZYLOPRIM) 300 MG tablet Take 300 mg by mouth every morning.     [provider]  amoxicillin (AMOXIL) 500 MG capsule Take 2,000 mg by mouth  See admin instructions. Take 4 capsules (2000 mg) by mouth one hour prior to dental procedure 01/01/16   [provider]  Coenzyme Q10 (COQ-10) 200 MG CAPS Take 200 mg by mouth every morning.    [provider]  Cyanocobalamin (VITAMIN B-12 PO) Take 1 tablet by mouth every morning.    [provider]  escitalopram (LEXAPRO) 20 MG tablet Take 20 mg by mouth every morning.     [provider]  hydrochlorothiazide (MICROZIDE) 12.5 MG capsule Take 12.5 mg by mouth every morning.     [provider]  hydroxychloroquine (PLAQUENIL) 200 MG tablet Take 200 mg by mouth 2 (two) times daily. 12/17/17   [provider]  lisinopril (ZESTRIL) 40 MG tablet TAKE ONE-HALF (1/2) TABLET TWICE A DAY 01/06/19   Jettie Booze, MD  metFORMIN (GLUCOPHAGE) 1000 MG tablet Take 1,000 mg by mouth  2 (two) times daily.    [provider]  Multiple Vitamin (MULTIVITAMIN WITH MINERALS) TABS tablet Take 1 tablet by mouth every morning. Men's One a Day     [provider]  Polyvinyl Alcohol-Povidone (CLEAR EYES NATURAL TEARS OP) Place 1 drop into both eyes 2 (two) times daily as needed (dry eyes).    [provider]  pregabalin (LYRICA) 75 MG capsule Take 75 mg by mouth 2 (two) times daily.     [provider]  PRESCRIPTION MEDICATION Inhale into the lungs at bedtime. CPAP    [provider]  rosuvastatin (CRESTOR) 5 MG tablet Take 1 tablet (5 mg total) by mouth daily at 6 PM. 01/06/19   Jettie Booze, MD  XARELTO 20 MG TABS tablet TAKE 1 TABLET DAILY WITH SUPPER 08/24/19   Jettie Booze, MD    Allergies    Patient has no known allergies.  Review of Systems   Review of Systems All other systems are reviewed and are negative for acute change except as noted in the HPI.  Physical Exam Updated Vital Signs BP (!) 141/64 (BP Location: Left Arm)   Pulse 64   Temp 98.4 F (36.9 C) (Oral)   Resp 18   SpO2 96%   Physical Exam Vitals and nursing note reviewed.  Constitutional:      General: He is not in acute distress.    Appearance: He is not ill-appearing.  HENT:     Head: Normocephalic and atraumatic.     Right Ear: Tympanic membrane and external ear normal.     Left Ear: Tympanic membrane and external ear normal.     Nose: Nose normal.     Mouth/Throat:     Mouth: Mucous membranes are moist.     Pharynx: Oropharynx is clear.  Eyes:     General: No scleral icterus.       Right eye: No discharge.        Left eye: No discharge.     Extraocular Movements: Extraocular movements intact.     Conjunctiva/sclera: Conjunctivae normal.     Pupils: Pupils are equal, round, and reactive to light.  Neck:     Vascular: No JVD.  Cardiovascular:     Rate and Rhythm: Normal rate. Rhythm irregular.     Pulses: Normal pulses.           Radial pulses are 2+ on the right side and 2+ on the left side.       Dorsalis pedis pulses are 2+ on the right side and 2+ on the left side.  Heart sounds: Normal heart sounds.  Pulmonary:     Comments: Lungs clear to auscultation in all fields. Symmetric chest rise. No wheezing, rales, or rhonchi. Abdominal:     Comments: Abdomen is soft, non-distended, and non-tender in all quadrants. No rigidity, no guarding. No peritoneal signs.  Musculoskeletal:        General: Normal range of motion.     Cervical back: Normal range of motion.     Comments: + Homans sign on the left lower extremity with palpable cord felt on deep palpation. + mild edema of left calf. Bruising noted to lateral aspect of left shin  Left lower extremity is neurovascularly intact. Compartments are soft  Skin:    General: Skin is warm and dry.     Capillary Refill: Capillary refill takes less than 2 seconds.  Neurological:     Mental Status: He is oriented to person, place, and time.     GCS: GCS eye subscore is 4. GCS verbal subscore is 5. GCS motor subscore is 6.     Comments: Fluent speech, no facial droop.  Ambulates with normal gait  Psychiatric:        Behavior: Behavior normal.     ED Results / Procedures / Treatments   Labs (all labs ordered are listed, but only abnormal results are displayed) Labs Reviewed - No data to display  EKG None  Radiology VAS [place] LOWER EXTREMITY VENOUS (DVT) (ONLY [facility] & [facility])  Result Date: 10/09/2019  Lower Venous DVTStudy Indications: Bruising and palpable knot to posterior calf.  Performing Technologist: June Leap RDMS, RVT  Examination Guidelines: A complete evaluation includes B-mode imaging, spectral Doppler, color Doppler, and power Doppler as needed of all accessible portions of each vessel. Bilateral testing is considered an integral part of a complete examination. Limited examinations for reoccurring indications may be performed as noted. The reflux portion of the  exam is performed with the patient in reverse Trendelenburg.  +---------+---------------+---------+-----------+----------+--------------+ RIGHT    CompressibilityPhasicitySpontaneityPropertiesThrombus Aging +---------+---------------+---------+-----------+----------+--------------+ CFV      Full           Yes      Yes                                 +---------+---------------+---------+-----------+----------+--------------+ SFJ      Full                                                        +---------+---------------+---------+-----------+----------+--------------+ FV Prox  Full                                                        +---------+---------------+---------+-----------+----------+--------------+ FV Mid   Full                                                        +---------+---------------+---------+-----------+----------+--------------+ FV DistalFull                                                        +---------+---------------+---------+-----------+----------+--------------+  PFV      Full                                                        +---------+---------------+---------+-----------+----------+--------------+ POP      Full           Yes      Yes                                 +---------+---------------+---------+-----------+----------+--------------+ PTV      Full                                                        +---------+---------------+---------+-----------+----------+--------------+ PERO     Full                                                        +---------+---------------+---------+-----------+----------+--------------+     Summary: RIGHT: - There is no evidence of deep vein thrombosis in the lower extremity. - There is no evidence of superficial venous thrombosis.  - No cystic structure found in the popliteal fossa. - Hypoechoic, lobulated area noted in the posterior calf, appears to be intramuscular.  Etiology unknown.   *See table(s) above for measurements and observations.    Preliminary     Procedures Procedures (including critical care time)  Medications Ordered in ED Medications - No data to display  ED Course  I have reviewed the triage vital signs and the nursing notes.  Pertinent labs & imaging results that were available during my care of the patient were reviewed by me and considered in my medical decision making (see chart for details).  Vitals:   10/09/19 1054 10/09/19 1249 10/09/19 1257  BP: (!) 141/64 112/76   Pulse: 64  61  Resp: 18  16  Temp: 98.4 F (36.9 C)    TempSrc: Oral    SpO2: 96%  94%      Clinical Course as of Oct 08 1499  Sun Oct 09, 2019  1346 Delay in [place] as they are currently at [facility]. Patient updated   [KA]    Clinical Course User Index [KA] Cardin Nitschke, Harley Hallmark, PA-C   MDM Rules/Calculators/A&P                          History provided by patient with additional history obtained from chart review.    Patient seen and examined. Patient presents awake, alert, hemodynamically stable, afebrile, non toxic.  No tachycardia or hypoxia.  On exam patient has positive Bevelyn Buckles' sign on the left.  There is small knot felt with deep palpation of left calf. DP pulse is 2+ bilaterally. Left lower extremity is neurovascularly intact.  Left lower extremity US is negative for DVT. The technician comments the knot felt on palpation appears to be in soft tissue vs IM. Will discharge patient home with recommendation to follow up with PCP for  further evaluation. Patient it agreeable with plan of care.  The patient appears reasonably screened and/or stabilized for discharge and I doubt any other medical condition or other [facility] requiring further screening, evaluation, or treatment in the ED at this time prior to discharge. The patient is safe for discharge with strict return precautions discussed. The patient was discussed with and seen by Dr. Kathrynn Humble who agrees  with the treatment plan.   Portions of this note were generated with [employer]. Dictation errors may occur despite best attempts at proofreading.   Final Clinical Impression(s) / ED Diagnoses Final diagnoses:  Left leg pain    Rx / DC Orders ED Discharge Orders    None       Cherre Robins, PA-C 10/09/19 1501    Varney Biles, MD 10/09/19 1608

## 2019-10-11 DIAGNOSIS — M79662 Pain in left lower leg: Secondary | ICD-10-CM | POA: Diagnosis not present

## 2019-10-20 DIAGNOSIS — M79662 Pain in left lower leg: Secondary | ICD-10-CM | POA: Diagnosis not present

## 2019-12-02 DIAGNOSIS — Z1283 Encounter for screening for malignant neoplasm of skin: Secondary | ICD-10-CM | POA: Diagnosis not present

## 2019-12-02 DIAGNOSIS — B078 Other viral warts: Secondary | ICD-10-CM | POA: Diagnosis not present

## 2019-12-02 DIAGNOSIS — L821 Other seborrheic keratosis: Secondary | ICD-10-CM | POA: Diagnosis not present

## 2019-12-09 DIAGNOSIS — H11441 Conjunctival cysts, right eye: Secondary | ICD-10-CM | POA: Diagnosis not present

## 2019-12-09 DIAGNOSIS — H25813 Combined forms of age-related cataract, bilateral: Secondary | ICD-10-CM | POA: Diagnosis not present

## 2019-12-09 DIAGNOSIS — H0520 Unspecified exophthalmos: Secondary | ICD-10-CM | POA: Diagnosis not present

## 2019-12-09 DIAGNOSIS — E119 Type 2 diabetes mellitus without complications: Secondary | ICD-10-CM | POA: Diagnosis not present

## 2019-12-14 DIAGNOSIS — Z0000 Encounter for general adult medical examination without abnormal findings: Secondary | ICD-10-CM | POA: Diagnosis not present

## 2019-12-14 DIAGNOSIS — E663 Overweight: Secondary | ICD-10-CM | POA: Diagnosis not present

## 2019-12-14 DIAGNOSIS — M109 Gout, unspecified: Secondary | ICD-10-CM | POA: Diagnosis not present

## 2019-12-14 DIAGNOSIS — Z7984 Long term (current) use of oral hypoglycemic drugs: Secondary | ICD-10-CM | POA: Diagnosis not present

## 2019-12-14 DIAGNOSIS — E7800 Pure hypercholesterolemia, unspecified: Secondary | ICD-10-CM | POA: Diagnosis not present

## 2019-12-14 DIAGNOSIS — E1140 Type 2 diabetes mellitus with diabetic neuropathy, unspecified: Secondary | ICD-10-CM | POA: Diagnosis not present

## 2019-12-14 DIAGNOSIS — I10 Essential (primary) hypertension: Secondary | ICD-10-CM | POA: Diagnosis not present

## 2019-12-14 DIAGNOSIS — I480 Paroxysmal atrial fibrillation: Secondary | ICD-10-CM | POA: Diagnosis not present

## 2019-12-15 DIAGNOSIS — J324 Chronic pansinusitis: Secondary | ICD-10-CM | POA: Diagnosis not present

## 2019-12-15 DIAGNOSIS — J342 Deviated nasal septum: Secondary | ICD-10-CM | POA: Diagnosis not present

## 2019-12-18 ENCOUNTER — Other Ambulatory Visit: Payer: Self-pay | Admitting: Interventional Cardiology

## 2019-12-29 NOTE — Progress Notes (Signed)
Cardiology Office Note   Date:  12/30/2019   ID:  Eddie Zimmerman, DOB 10-13-1945, MRN [id number]  PCP:  [name], MD    No chief complaint on file.  PAF  Wt Readings from Last 3 Encounters:  12/30/19 236 lb (107 kg)  12/28/18 236 lb 12.8 oz (107.4 kg)  04/20/18 248 lb (112.5 kg)       History of Present Illness: Eddie Zimmerman is a 74 y.o. male  with a h/o hyperlipidemia. He had a h/o obesity s/p lap band surgery and has lost about 60+ lbs.   Negative stress test in 2010.   He hashas OSA.  He had a cryptogenic stroke with ILR placed 3/16. Brief episode of asymptomatic Afib was noted in 8/16 and pt was referred the Afib clinic to discuss anticoagulation. He was started on xarelto due to the strokes.  His LINQ monitorwas removed in 2018.  In the past,I noted: "He is walking some, more the elliptical 25-30 minutes a few times a week. He does some weights as well, so he gets over 150 minutes a week."  In 2020, he had a nasal surgery to help with some drainage.  He had a staph infection in his nose.  He has been on multiple antibiotics but the infection did not clear.   In late 2020, he was able to resume regular walking for 50 to 60 minutes and started going back to the gym.  Felt better after seeing Dr. Benjamine Mola.    Denies : Chest pain. Dizziness. Leg edema. Nitroglycerin use. Orthopnea. Palpitations. Paroxysmal nocturnal dyspnea. Shortness of breath. Syncope.   He continues to walk and go to the gym.  Feels well.   He tolerated COVID vaccines.     Past Medical History:  Diagnosis Date   Acute ischemic left MCA stroke ([place]) 08/21/2014   Aphasia    Arthritis    hands, wrist & Knee    Chronic diastolic heart failure ([place]) 06/08/2014   Cryptogenic stroke ([place]) 06/29/2014   CVA (cerebral vascular accident) ([place]) 06/14/2014   Depression    Diabetes ([place]) 08/21/2014   Diabetes mellitus without complication ([place])    Dysrhythmia    ? Atrial  fibrillation episode" Loop Recorder" implanted left chest- [place] follows   Essential hypertension, benign 04/07/2013   Gout    Hearing impaired person, bilateral    hearing aids used   History of kidney stones LAST MAY 2015   HLD (hyperlipidemia) 08/21/2014   Hx of laparoscopic gastric banding    remains in place   Hypertension    Hypertensive heart disease 03/17/2016   Hypertrophic obstructive cardiomyopathy ([place]) 04/07/2013   Obstructive sleep apnea 04/07/2013   PAF (paroxysmal atrial fibrillation) ([place]) 06/14/2015   Sleep apnea    cpap use, SETTING OF 13   Slurred speech 06/08/2014   Stroke ([place]) 06-07-2014    Past Surgical History:  Procedure Laterality Date   ARTHROSCOPIC REPAIR PCL Bilateral    right- x1, left- x3    COLONOSCOPY WITH PROPOFOL N/A 10/24/2014   Procedure: COLONOSCOPY WITH PROPOFOL;  Surgeon: Garlan Fair, MD;  Location: [facility];  Service: Endoscopy;  Laterality: N/A;   CYSTOSCOPY WITH RETROGRADE PYELOGRAM, URETEROSCOPY AND STENT PLACEMENT Right 01/31/2016   Procedure: CYSTOSCOPY WITH RETROGRADE PYELOGRAM, URETEROSCOPY AND STENT PLACEMENT;  Surgeon: [name], MD;  Location: [facility];  Service: Urology;  Laterality: Right;   CYSTOSCOPY WITH URETHRAL DILATATION N/A 01/31/2016   Procedure: CYSTOSCOPY WITH URETHRAL BALLOON DILATATION;  Surgeon: [name], MD;  [facility]: [facility];  Service: Urology;  Laterality: N/A;   CYSTOSCOPY/URETEROSCOPY/HOLMIUM LASER/STENT PLACEMENT Bilateral 04/14/2016   Procedure: CYSTOSCOPY/URETEROSCOPY   RETROGRADE PYELOGRAM;  Surgeon: [name], MD;  [facility]: [facility];  Service: Urology;  Laterality: Bilateral;   ETHMOIDECTOMY Right 04/30/2018   Procedure: ANTERIOR ETHMOIDECTOMY RIGHT SIDE;  Surgeon: Rozetta Nunnery, MD;  Location: [facility];  Service: ENT;  Laterality: Right;   HERNIA REPAIR     abdominal   HOLMIUM LASER APPLICATION Right 01/01/453   Procedure: HOLMIUM LASER APPLICATION;  Surgeon:  [name], MD;  [facility]: [facility];  Service: Urology;  Laterality: Right;   JOINT REPLACEMENT Left    KNEE SURGERY Left    LAPAROSCOPIC GASTRIC BANDING     LOOP RECORDER IMPLANT N/A 07/12/2014   Procedure: LOOP RECORDER IMPLANT;  Surgeon: Evans Lance, MD;  Location: [facility];  Service: Cardiovascular;  Laterality: N/A;   LOOP RECORDER REMOVAL N/A 08/05/2016   Procedure: Loop Recorder Removal;  Surgeon: Evans Lance, MD;  Location: [facility];  Service: Cardiovascular;  Laterality: N/A;   MAXILLARY ANTROSTOMY Right 04/30/2018   Procedure: RIGHT MAXILLARY ANTROSTOMY WITH REMOVAL OF TISSUE;  Surgeon: Rozetta Nunnery, MD;  Location: [facility] OR;  Service: ENT;  Laterality: Right;   NASAL SINUS SURGERY Right 04/30/2018   Procedure: FUNCTIONAL ENDOSCOPIC SINUS SURGERY;  Surgeon: Rozetta Nunnery, MD;  Location: [facility];  Service: ENT;  Laterality: Right;   TARSAL TUNNEL RELEASE Bilateral 2005   TEE WITHOUT CARDIOVERSION N/A 06/14/2014   Procedure: TRANSESOPHAGEAL ECHOCARDIOGRAM (TEE);  Surgeon: Sueanne Margarita, MD;  Location: [facility];  Service: Cardiovascular;  Laterality: N/A;   TONSILLECTOMY     TOTAL KNEE ARTHROPLASTY Right 06/07/2014   Procedure: TOTAL KNEE ARTHROPLASTY;  Surgeon: Kerin Salen, MD;  Location: [facility];  Service: Orthopedics;  Laterality: Right;     Current Outpatient Medications  Medication Sig Dispense Refill   allopurinol (ZYLOPRIM) 100 MG tablet Take 100 mg by mouth every morning.     allopurinol (ZYLOPRIM) 300 MG tablet Take 300 mg by mouth every morning.      amoxicillin (AMOXIL) 500 MG capsule Take 2,000 mg by mouth See admin instructions. Take 4 capsules (2000 mg) by mouth one hour prior to dental procedure     Coenzyme Q10 (COQ-10) 200 MG CAPS Take 200 mg by mouth every morning.     Cyanocobalamin (VITAMIN B-12 PO) Take 1 tablet by mouth every morning.     escitalopram (LEXAPRO) 20 MG tablet Take 20 mg by mouth every morning.       hydrochlorothiazide (MICROZIDE) 12.5 MG capsule Take 12.5 mg by mouth every morning.      hydroxychloroquine (PLAQUENIL) 200 MG tablet Take 200 mg by mouth 2 (two) times daily.     lisinopril (ZESTRIL) 40 MG tablet TAKE ONE-HALF (1/2) TABLET TWICE A DAY. Please keep upcoming appt in September with Dr. Irish Lack before anymore refills. Thank you 90 tablet 0   metFORMIN (GLUCOPHAGE) 1000 MG tablet Take 1,000 mg by mouth 2 (two) times daily.     Multiple Vitamin (MULTIVITAMIN WITH MINERALS) TABS tablet Take 1 tablet by mouth every morning. Men's One a Day      Polyvinyl Alcohol-Povidone (CLEAR EYES NATURAL TEARS OP) Place 1 drop into both eyes 2 (two) times daily as needed (dry eyes).     pregabalin (LYRICA) 75 MG capsule Take 75 mg by mouth 2 (two) times daily.  PRESCRIPTION MEDICATION Inhale into the lungs at bedtime. CPAP     rosuvastatin (CRESTOR) 5 MG tablet Take 1 tablet (5 mg total) by mouth daily at 6 PM. 90 tablet 3   XARELTO 20 MG TABS tablet TAKE 1 TABLET DAILY WITH SUPPER 90 tablet 1   No current facility-administered medications for this visit.    Allergies:   Patient has no known allergies.    Social History:  The patient  reports that he has never smoked. He has never used smokeless tobacco. He reports current alcohol use. He reports that he does not use drugs.   Family History:  The patient's family history includes AAA (abdominal aortic aneurysm) in his brother and mother; Alzheimer's disease in his father; Cirrhosis in his brother; Hypertension in his brother and mother.    ROS:  Please see the history of present illness.   Otherwise, review of systems are positive for improved nasal drainage.   All other systems are reviewed and negative.    PHYSICAL EXAM: VS:  BP 106/70    Pulse 62    Ht 5\' 10"  (1.778 m)    Wt 236 lb (107 kg)    SpO2 97%    BMI 33.86 kg/m  , BMI Body mass index is 33.86 kg/m. GEN: Well nourished, well developed, in no acute distress  HEENT:  normal  Neck: no JVD, carotid bruits, or masses Cardiac: RRR; no murmurs, rubs, or gallops,no edema  Respiratory:  clear to auscultation bilaterally, normal work of breathing GI: soft, nontender, nondistended, + BS MS: no deformity or atrophy  Skin: warm and dry, no rash Neuro:  Strength and sensation are intact Psych: euthymic mood, full affect   EKG:   The ekg ordered today demonstrates NSR, T wave changes, unchanged from prior   Recent Labs: No results found for requested labs within last 8760 hours.   Lipid Panel    Component Value Date/Time   CHOL 161 04/02/2016 0917   TRIG 179 (H) 04/02/2016 0917   HDL 36 (L) 04/02/2016 0917   CHOLHDL 4.5 04/02/2016 0917   VLDL 36 (H) 04/02/2016 0917   LDLCALC 89 04/02/2016 0917     Other studies Reviewed: Additional studies/ records that were reviewed today with results demonstrating: labs reviewed.   ASSESSMENT AND PLAN:  1. PAF: Xarelto for stroke prevention.  In NSR.  No bleeding problems.  2. Hypertensive heart disease: Low-salt diet. Continue with exercise.   3. Hyperlipidemia: TG elevated.  With better glucose control, TG will improve.  LDL 61, TG 212. 4. Diabetes mellitus: Continue Metformin and trend towards whole food, plant-based diet.  A1C 7.5.   5. Obesity: He continues to try to lose weight and maintain it.  S/p weight loss surgery. 6. Given RF , we discussed calcium scoring CT scan. He is agreeable to pay out of pocket for calcium scoring CT scan.   Current medicines are reviewed at length with the patient today.  The patient concerns regarding his medicines were addressed.  The following changes have been made:  No change  Labs/ tests ordered today include:  No orders of the defined types were placed in this encounter.   Recommend 150 minutes/week of aerobic exercise Low fat, low carb, high fiber diet recommended  Disposition:   FU in 1 year   Signed, Larae Grooms, MD  12/30/2019 10:35 AM    [facility] [address], [place], [place]  [address] Phone: [phone]; Fax: [phone]

## 2019-12-30 ENCOUNTER — Encounter: Payer: Self-pay | Admitting: Interventional Cardiology

## 2019-12-30 ENCOUNTER — Other Ambulatory Visit: Payer: Self-pay

## 2019-12-30 ENCOUNTER — Ambulatory Visit (INDEPENDENT_AMBULATORY_CARE_PROVIDER_SITE_OTHER): Payer: Medicare Other | Admitting: Interventional Cardiology

## 2019-12-30 VITALS — BP 106/70 | HR 62 | Ht 70.0 in | Wt 236.0 lb

## 2019-12-30 DIAGNOSIS — E782 Mixed hyperlipidemia: Secondary | ICD-10-CM

## 2019-12-30 DIAGNOSIS — I119 Hypertensive heart disease without heart failure: Secondary | ICD-10-CM | POA: Diagnosis not present

## 2019-12-30 DIAGNOSIS — E669 Obesity, unspecified: Secondary | ICD-10-CM | POA: Diagnosis not present

## 2019-12-30 DIAGNOSIS — I480 Paroxysmal atrial fibrillation: Secondary | ICD-10-CM

## 2019-12-30 NOTE — Patient Instructions (Signed)
Medication Instructions:  Your physician recommends that you continue on your current medications as directed. Please refer to the Current Medication list given to you today.  *If you need a refill on your cardiac medications before your next appointment, please call your pharmacy*   Lab Work: None  If you have labs (blood work) drawn today and your tests are completely normal, you will receive your results only by: [name] MyChart Message (if you have MyChart) OR . A paper copy in the mail If you have any lab test that is abnormal or we need to change your treatment, we will call you to review the results.   Testing/Procedures: Your physician recommends that you have a Calcium Score CT.   Follow-Up: At [facility], you and your health needs are our priority.  As part of our continuing mission to provide you with exceptional heart care, we have created designated Provider Care Teams.  These Care Teams include your primary Cardiologist (physician) and Advanced Practice Providers (APPs -  Physician Assistants and Nurse Practitioners) who all work together to provide you with the care you need, when you need it.  We recommend signing up for the patient portal called "MyChart".  Sign up information is provided on this After Visit Summary.  MyChart is used to connect with patients for Virtual Visits (Telemedicine).  Patients are able to view lab/test results, encounter notes, upcoming appointments, etc.  Non-urgent messages can be sent to your provider as well.   To learn more about what you can do with MyChart, go to [email or web address].    Your next appointment:   12 month(s)  The format for your next appointment:   In Person  Provider:   You may see Larae Grooms, MD or one of the following Advanced Practice Providers on your designated Care Team:    Melina Copa, PA-C  Ermalinda Barrios, PA-C    Other Instructions None

## 2020-01-10 DIAGNOSIS — M67371 Transient synovitis, right ankle and foot: Secondary | ICD-10-CM | POA: Diagnosis not present

## 2020-01-12 ENCOUNTER — Ambulatory Visit (INDEPENDENT_AMBULATORY_CARE_PROVIDER_SITE_OTHER)
Admission: RE | Admit: 2020-01-12 | Discharge: 2020-01-12 | Disposition: A | Discharge status: Home / Self Care | Payer: Self-pay | Source: Ambulatory Visit | Attending: Interventional Cardiology | Admitting: Interventional Cardiology

## 2020-01-12 ENCOUNTER — Other Ambulatory Visit: Payer: Self-pay

## 2020-01-12 DIAGNOSIS — I119 Hypertensive heart disease without heart failure: Secondary | ICD-10-CM

## 2020-01-12 DIAGNOSIS — E782 Mixed hyperlipidemia: Secondary | ICD-10-CM

## 2020-01-12 DIAGNOSIS — E669 Obesity, unspecified: Secondary | ICD-10-CM

## 2020-01-12 DIAGNOSIS — I480 Paroxysmal atrial fibrillation: Secondary | ICD-10-CM

## 2020-01-19 DIAGNOSIS — M67372 Transient synovitis, left ankle and foot: Secondary | ICD-10-CM | POA: Diagnosis not present

## 2020-01-23 DIAGNOSIS — M7752 Other enthesopathy of left foot: Secondary | ICD-10-CM | POA: Diagnosis not present

## 2020-01-23 DIAGNOSIS — M67372 Transient synovitis, left ankle and foot: Secondary | ICD-10-CM | POA: Diagnosis not present

## 2020-01-25 DIAGNOSIS — Z23 Encounter for immunization: Secondary | ICD-10-CM | POA: Diagnosis not present

## 2020-02-07 DIAGNOSIS — K1329 Other disturbances of oral epithelium, including tongue: Secondary | ICD-10-CM | POA: Diagnosis not present

## 2020-02-10 DIAGNOSIS — K136 Irritative hyperplasia of oral mucosa: Secondary | ICD-10-CM | POA: Diagnosis not present

## 2020-02-10 MED ORDER — ROSUVASTATIN CALCIUM 5 MG PO TABS: 5.0000 mg | ORAL_TABLET | Freq: Every day | ORAL | 3 refills | Status: DC

## 2020-02-10 NOTE — Telephone Encounter (Signed)
Called pt's medication rosuvastatin into pt's pharmacy [facility]. Pharmacist verbalized understanding.

## 2020-02-20 ENCOUNTER — Other Ambulatory Visit: Payer: Self-pay | Admitting: Interventional Cardiology

## 2020-02-20 NOTE — Telephone Encounter (Signed)
Prescription refill request for Xarelto received.   Last office visit: [place], 12/30/2019 Weight: 107 kg Age: 74 yo Scr: 1.18, 12/14/2019 via [facility] CrCl: 87 ml/min   Prescription refill sent.

## 2020-02-26 ENCOUNTER — Other Ambulatory Visit: Payer: Self-pay | Admitting: Interventional Cardiology

## 2020-03-09 IMAGING — DX DG WRIST COMPLETE 3+V*R*
4 series · 4 of 4 positions shown · non-contrast
Comparison: None.

CLINICAL DATA: Worsening right wrist pain since a fall in [place].
History of gout

EXAM:
RIGHT WRIST - COMPLETE 3+ VIEW

[wrist pa]
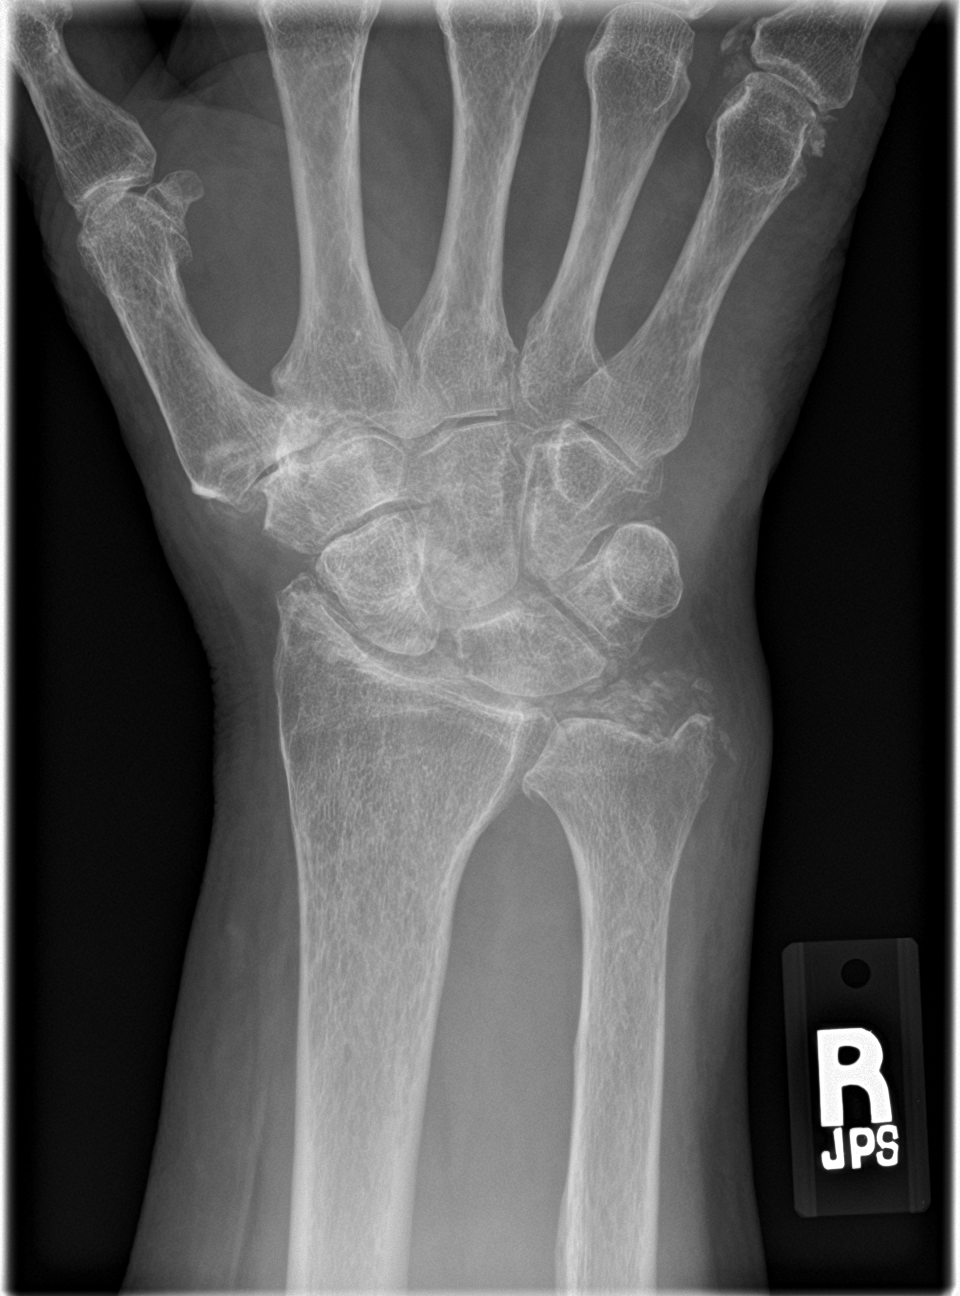

[wrist obl]
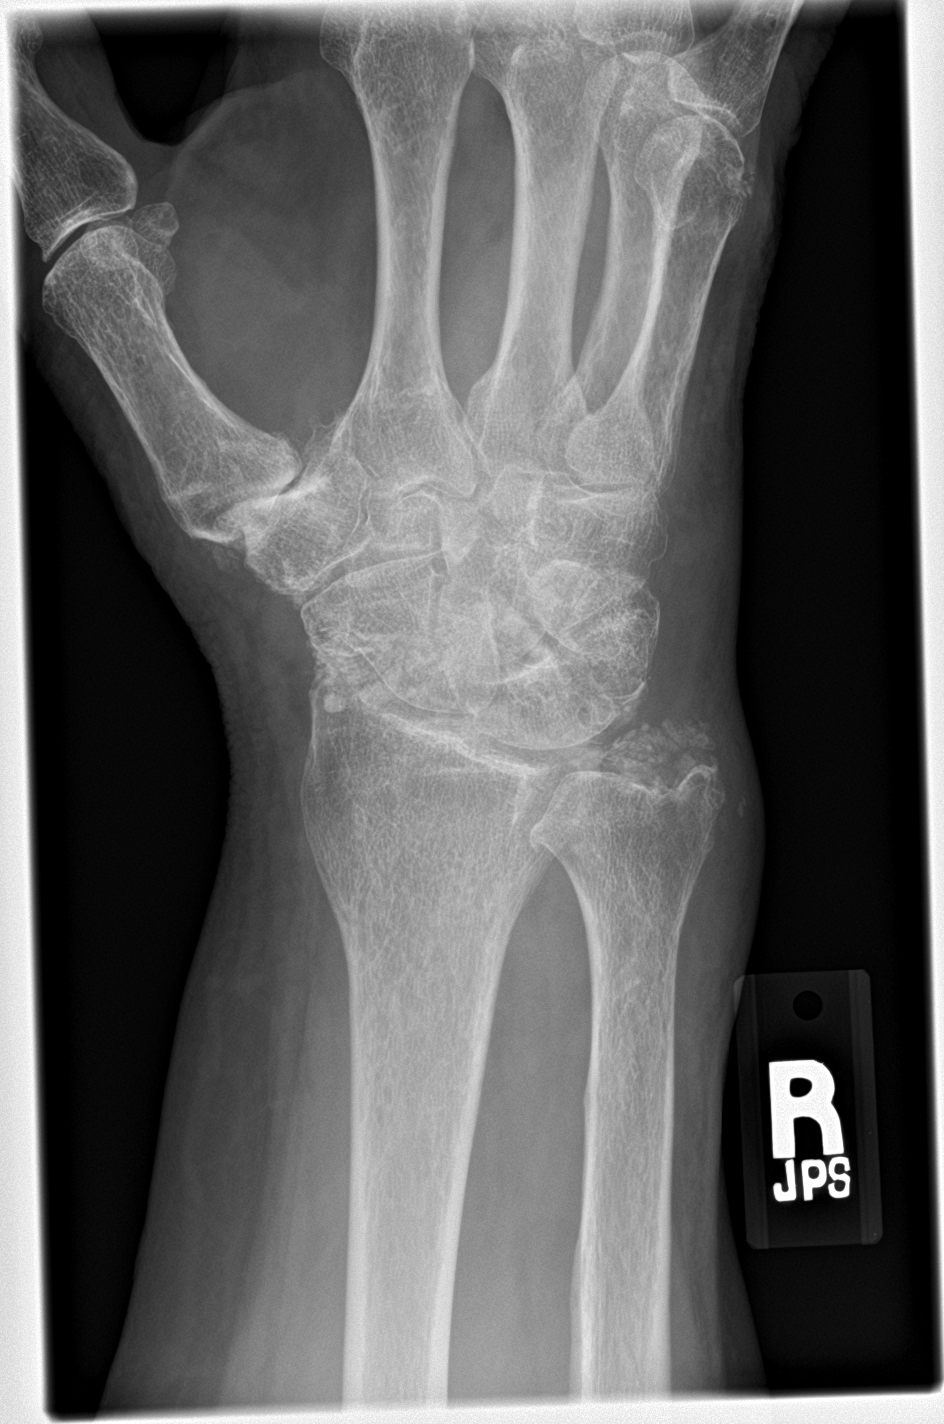

[wrist lat]
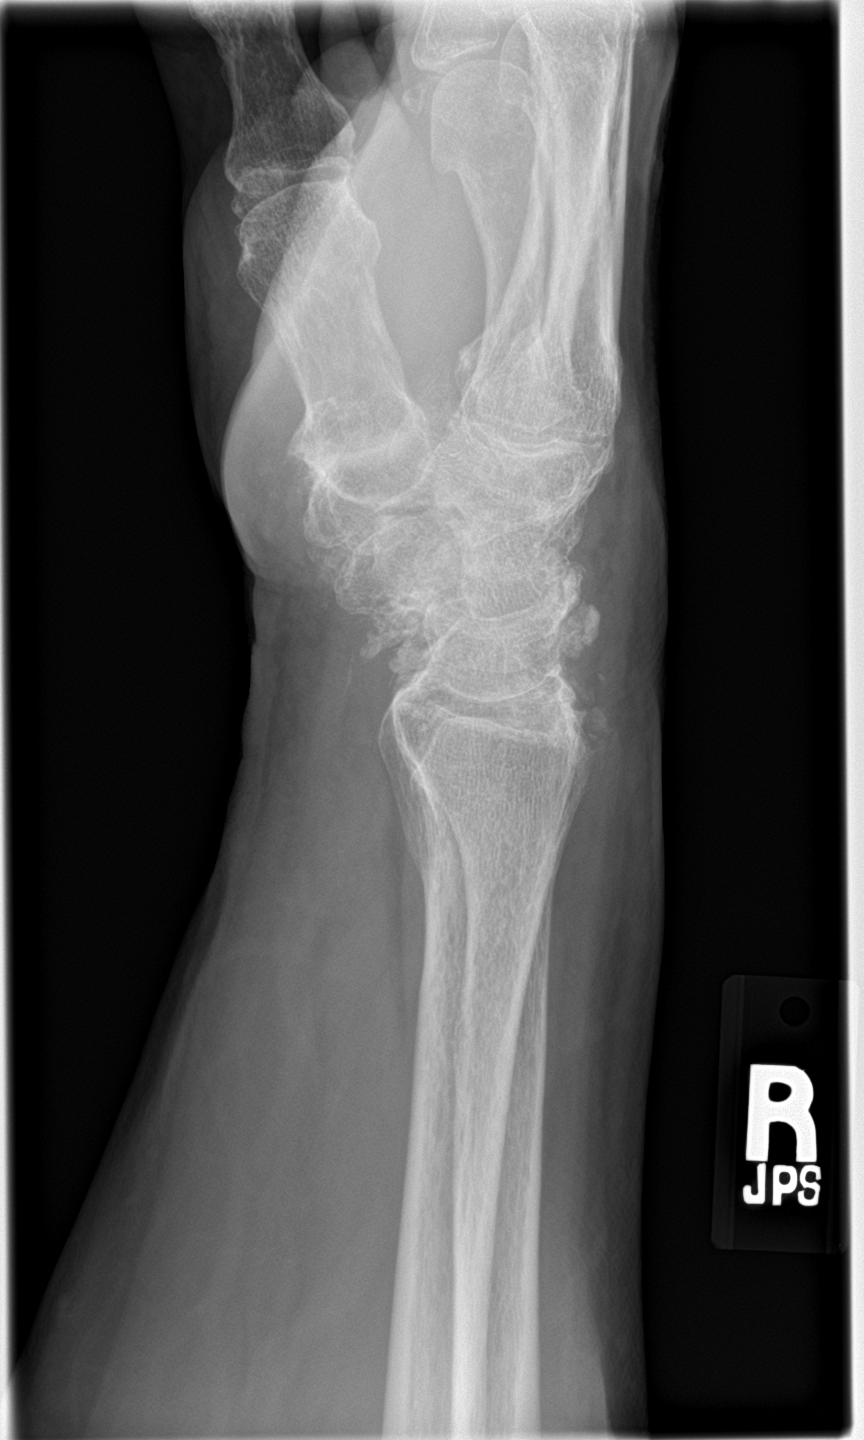

[wrist navicular]
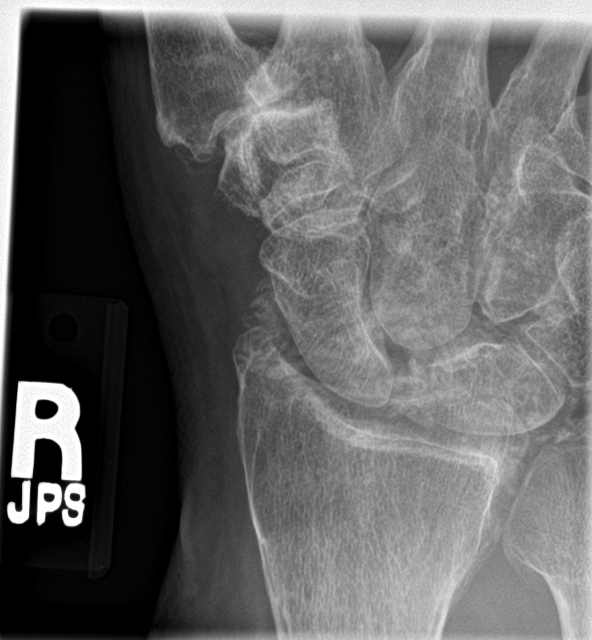

[4 of 4 positions shown; findings below may reference images not displayed]

FINDINGS: Extensive calcification about the wrist, more nodular and extensive
than seen with senile chondrocalcinosis. There is also articular and
extra-articular calcification about the fifth MCP joint. No erosion
or malalignment.

There is osteopenia. First CMC osteoarthritic narrowing and
spurring.

Nonspecific soft tissue swelling.
IMPRESSION: 1. Tophaceous type calcification at the wrist and fifth MCP joint.
2. First CMC osteoarthritis.

## 2020-04-05 DIAGNOSIS — Z1159 Encounter for screening for other viral diseases: Secondary | ICD-10-CM | POA: Diagnosis not present

## 2020-04-10 DIAGNOSIS — K648 Other hemorrhoids: Secondary | ICD-10-CM | POA: Diagnosis not present

## 2020-04-10 DIAGNOSIS — K5730 Diverticulosis of large intestine without perforation or abscess without bleeding: Secondary | ICD-10-CM | POA: Diagnosis not present

## 2020-04-10 DIAGNOSIS — K635 Polyp of colon: Secondary | ICD-10-CM | POA: Diagnosis not present

## 2020-04-10 DIAGNOSIS — Z86010 Personal history of colonic polyps: Secondary | ICD-10-CM | POA: Diagnosis not present

## 2020-04-10 DIAGNOSIS — D122 Benign neoplasm of ascending colon: Secondary | ICD-10-CM | POA: Diagnosis not present

## 2020-04-11 DIAGNOSIS — J310 Chronic rhinitis: Secondary | ICD-10-CM | POA: Diagnosis not present

## 2020-04-11 DIAGNOSIS — J342 Deviated nasal septum: Secondary | ICD-10-CM | POA: Diagnosis not present

## 2020-04-12 DIAGNOSIS — K635 Polyp of colon: Secondary | ICD-10-CM | POA: Diagnosis not present

## 2020-04-12 DIAGNOSIS — D122 Benign neoplasm of ascending colon: Secondary | ICD-10-CM | POA: Diagnosis not present

## 2020-05-02 DIAGNOSIS — M76822 Posterior tibial tendinitis, left leg: Secondary | ICD-10-CM | POA: Diagnosis not present

## 2020-05-02 DIAGNOSIS — M19072 Primary osteoarthritis, left ankle and foot: Secondary | ICD-10-CM | POA: Diagnosis not present

## 2020-05-07 DIAGNOSIS — M76822 Posterior tibial tendinitis, left leg: Secondary | ICD-10-CM | POA: Diagnosis not present

## 2020-05-24 DIAGNOSIS — Z79899 Other long term (current) drug therapy: Secondary | ICD-10-CM | POA: Diagnosis not present

## 2020-05-24 DIAGNOSIS — M1009 Idiopathic gout, multiple sites: Secondary | ICD-10-CM | POA: Diagnosis not present

## 2020-06-01 ENCOUNTER — Other Ambulatory Visit: Payer: Self-pay | Admitting: Orthopaedic Surgery

## 2020-06-01 DIAGNOSIS — M2012 Hallux valgus (acquired), left foot: Secondary | ICD-10-CM | POA: Diagnosis not present

## 2020-06-01 DIAGNOSIS — M21622 Bunionette of left foot: Secondary | ICD-10-CM | POA: Diagnosis not present

## 2020-06-01 DIAGNOSIS — M65272 Calcific tendinitis, left ankle and foot: Secondary | ICD-10-CM | POA: Diagnosis not present

## 2020-06-12 DIAGNOSIS — E7800 Pure hypercholesterolemia, unspecified: Secondary | ICD-10-CM | POA: Diagnosis not present

## 2020-06-12 DIAGNOSIS — M109 Gout, unspecified: Secondary | ICD-10-CM | POA: Diagnosis not present

## 2020-06-12 DIAGNOSIS — E1140 Type 2 diabetes mellitus with diabetic neuropathy, unspecified: Secondary | ICD-10-CM | POA: Diagnosis not present

## 2020-06-12 DIAGNOSIS — I10 Essential (primary) hypertension: Secondary | ICD-10-CM | POA: Diagnosis not present

## 2020-06-12 DIAGNOSIS — Z7984 Long term (current) use of oral hypoglycemic drugs: Secondary | ICD-10-CM | POA: Diagnosis not present

## 2020-06-12 DIAGNOSIS — G4730 Sleep apnea, unspecified: Secondary | ICD-10-CM | POA: Diagnosis not present

## 2020-06-12 DIAGNOSIS — I480 Paroxysmal atrial fibrillation: Secondary | ICD-10-CM | POA: Diagnosis not present

## 2020-06-21 DIAGNOSIS — M5417 Radiculopathy, lumbosacral region: Secondary | ICD-10-CM | POA: Diagnosis not present

## 2020-06-21 DIAGNOSIS — E559 Vitamin D deficiency, unspecified: Secondary | ICD-10-CM | POA: Diagnosis not present

## 2020-06-21 DIAGNOSIS — R634 Abnormal weight loss: Secondary | ICD-10-CM | POA: Diagnosis not present

## 2020-06-21 DIAGNOSIS — G609 Hereditary and idiopathic neuropathy, unspecified: Secondary | ICD-10-CM | POA: Diagnosis not present

## 2020-06-21 DIAGNOSIS — G603 Idiopathic progressive neuropathy: Secondary | ICD-10-CM | POA: Diagnosis not present

## 2020-06-21 DIAGNOSIS — R202 Paresthesia of skin: Secondary | ICD-10-CM | POA: Diagnosis not present

## 2020-06-21 DIAGNOSIS — G3184 Mild cognitive impairment, so stated: Secondary | ICD-10-CM | POA: Diagnosis not present

## 2020-06-21 DIAGNOSIS — G5602 Carpal tunnel syndrome, left upper limb: Secondary | ICD-10-CM | POA: Diagnosis not present

## 2020-06-21 DIAGNOSIS — Z79899 Other long term (current) drug therapy: Secondary | ICD-10-CM | POA: Diagnosis not present

## 2020-06-27 ENCOUNTER — Encounter (HOSPITAL_BASED_OUTPATIENT_CLINIC_OR_DEPARTMENT_OTHER): Payer: Self-pay | Admitting: Orthopaedic Surgery

## 2020-06-27 ENCOUNTER — Telehealth: Payer: Self-pay | Admitting: Interventional Cardiology

## 2020-06-27 ENCOUNTER — Other Ambulatory Visit: Payer: Self-pay

## 2020-06-27 NOTE — Telephone Encounter (Signed)
Patient with diagnosis of afib on Xarelto for anticoagulation.    Procedure: Left fifth metatarsal osteotomy with bunionette excision  Date of procedure: 07/03/20  CHA2DS2-VASc Score = 7  This indicates a 11.2% annual risk of stroke. The patient's score is based upon: CHF History: Yes HTN History: Yes Diabetes History: Yes Stroke History: Yes Vascular Disease History: Yes Age Score: 1 Gender Score: 0     CrCl 66.61ml/min  Would typically recommend a 2 day hold. However pt has hx of CVA. Patient has been cleared to hold anticoagulation in the past for longer periods of time, however this was 2 years ago. Therefore, I will send to Dr. Irish Lack.

## 2020-06-27 NOTE — Telephone Encounter (Signed)
   [facility] Pre-operative Risk Assessment    HEARTCARE STAFF: - Please ensure there is not already an duplicate clearance open for this procedure. - Under Visit Info/Reason for Call, type in Other and utilize the format Clearance MM/DD/YY or Clearance TBD. Do not use dashes or single digits. - If request is for dental extraction, please clarify the # of teeth to be extracted.  Request for surgical clearance:  1. What type of surgery is being performed? Left fifth metatarsal osteotomy with bunionette excision   2. When is this surgery scheduled? 07/03/2020   3. What type of clearance is required (medical clearance vs. Pharmacy clearance to hold med vs. Both)? Both  4. Are there any medications that need to be held prior to surgery and how long? Xarelto, leaving up to cardiologist  5. Practice name and name of physician performing surgery? [facility], Dr. Juanetta Gosling  6. What is the office phone number? [phone]   7.   What is the office fax number? [phone]  8.   Anesthesia type (None, local, MAC, general) ? choice   Selena Zobro 06/27/2020, 1:35 PM  _________________________________________________________________   (provider comments below)

## 2020-06-27 NOTE — Progress Notes (Signed)
Tatiana at Dr Pollie Friar office called for Xarelto orders. Dr Irish Lack is cards MD who writes for Xarelto. She will send request.

## 2020-06-27 NOTE — Telephone Encounter (Signed)
OK to hold Xarelto or 2 days prior.  JV

## 2020-06-28 NOTE — Telephone Encounter (Signed)
   Primary Cardiologist: Larae Grooms, MD  Chart reviewed as part of pre-operative protocol coverage. He is getting > 4 mets of activity. Given past medical history and time since last visit, based on ACC/AHA guidelines, REESE STOCKMAN would be at acceptable risk for the planned procedure without further cardiovascular testing.   The patient was advised that if he develops new symptoms prior to surgery to contact our office to arrange for a follow-up visit, and he verbalized understanding.  I will route this recommendation to the requesting party via Epic fax function and remove from pre-op pool.  Please call with questions.  [place], [place] 06/28/2020, 8:41 AM

## 2020-06-29 ENCOUNTER — Other Ambulatory Visit (HOSPITAL_COMMUNITY): Payer: Medicare Other

## 2020-06-30 ENCOUNTER — Other Ambulatory Visit (HOSPITAL_COMMUNITY)
Admission: RE | Admit: 2020-06-30 | Discharge: 2020-06-30 | Disposition: A | Discharge status: Home / Self Care | Payer: Medicare Other | Source: Ambulatory Visit | Attending: Orthopaedic Surgery | Admitting: Orthopaedic Surgery

## 2020-06-30 DIAGNOSIS — Z01812 Encounter for preprocedural laboratory examination: Secondary | ICD-10-CM | POA: Diagnosis not present

## 2020-06-30 DIAGNOSIS — Z20822 Contact with and (suspected) exposure to covid-19: Secondary | ICD-10-CM | POA: Insufficient documentation

## 2020-06-30 LAB — SARS CORONAVIRUS 2 (TAT 6-24 HRS): SARS Coronavirus 2: NEGATIVE

## 2020-07-02 ENCOUNTER — Encounter (HOSPITAL_BASED_OUTPATIENT_CLINIC_OR_DEPARTMENT_OTHER)
Admission: RE | Admit: 2020-07-02 | Discharge: 2020-07-02 | Disposition: A | Discharge status: Home / Self Care | Payer: Medicare Other | Source: Ambulatory Visit | Attending: Orthopaedic Surgery | Admitting: Orthopaedic Surgery

## 2020-07-02 DIAGNOSIS — M21622 Bunionette of left foot: Secondary | ICD-10-CM | POA: Diagnosis not present

## 2020-07-02 DIAGNOSIS — Z7901 Long term (current) use of anticoagulants: Secondary | ICD-10-CM | POA: Diagnosis not present

## 2020-07-02 DIAGNOSIS — I480 Paroxysmal atrial fibrillation: Secondary | ICD-10-CM | POA: Diagnosis not present

## 2020-07-02 DIAGNOSIS — Z8673 Personal history of transient ischemic attack (TIA), and cerebral infarction without residual deficits: Secondary | ICD-10-CM | POA: Diagnosis not present

## 2020-07-02 DIAGNOSIS — I421 Obstructive hypertrophic cardiomyopathy: Secondary | ICD-10-CM | POA: Diagnosis not present

## 2020-07-02 DIAGNOSIS — I5032 Chronic diastolic (congestive) heart failure: Secondary | ICD-10-CM | POA: Diagnosis not present

## 2020-07-02 DIAGNOSIS — E1142 Type 2 diabetes mellitus with diabetic polyneuropathy: Secondary | ICD-10-CM | POA: Diagnosis not present

## 2020-07-02 DIAGNOSIS — Z79899 Other long term (current) drug therapy: Secondary | ICD-10-CM | POA: Diagnosis not present

## 2020-07-02 DIAGNOSIS — M109 Gout, unspecified: Secondary | ICD-10-CM | POA: Diagnosis not present

## 2020-07-02 DIAGNOSIS — I110 Hypertensive heart disease with heart failure: Secondary | ICD-10-CM | POA: Diagnosis not present

## 2020-07-02 DIAGNOSIS — E785 Hyperlipidemia, unspecified: Secondary | ICD-10-CM | POA: Diagnosis not present

## 2020-07-02 DIAGNOSIS — Z8249 Family history of ischemic heart disease and other diseases of the circulatory system: Secondary | ICD-10-CM | POA: Diagnosis not present

## 2020-07-02 DIAGNOSIS — Z96653 Presence of artificial knee joint, bilateral: Secondary | ICD-10-CM | POA: Diagnosis not present

## 2020-07-02 DIAGNOSIS — Z7984 Long term (current) use of oral hypoglycemic drugs: Secondary | ICD-10-CM | POA: Diagnosis not present

## 2020-07-02 DIAGNOSIS — Z9884 Bariatric surgery status: Secondary | ICD-10-CM | POA: Diagnosis not present

## 2020-07-02 DIAGNOSIS — Z01812 Encounter for preprocedural laboratory examination: Secondary | ICD-10-CM | POA: Insufficient documentation

## 2020-07-02 LAB — BASIC METABOLIC PANEL
Anion gap: 11 (ref 5–15)
BUN: 14 mg/dL (ref 8–23)
CO2: 22 mmol/L (ref 22–32)
Calcium: 9.5 mg/dL (ref 8.9–10.3)
Chloride: 107 mmol/L (ref 98–111)
Creatinine, Ser: 1.12 mg/dL (ref 0.61–1.24)
GFR, Estimated: >60 mL/min (ref >60)
Glucose, Bld: 114 mg/dL — ABNORMAL HIGH (ref 70–99)
Potassium: 4.1 mmol/L (ref 3.5–5.1)
Sodium: 140 mmol/L (ref 135–145)

## 2020-07-02 LAB — HEMOGLOBIN A1C
Hgb A1c MFr Bld: 6.6 % — ABNORMAL HIGH (ref 4.8–5.6)
Mean Plasma Glucose: 142.72 mg/dL

## 2020-07-02 NOTE — Progress Notes (Addendum)

## 2020-07-03 ENCOUNTER — Other Ambulatory Visit: Payer: Self-pay

## 2020-07-03 ENCOUNTER — Encounter (HOSPITAL_BASED_OUTPATIENT_CLINIC_OR_DEPARTMENT_OTHER): Payer: Self-pay | Admitting: Orthopaedic Surgery

## 2020-07-03 ENCOUNTER — Ambulatory Visit (HOSPITAL_BASED_OUTPATIENT_CLINIC_OR_DEPARTMENT_OTHER): Payer: Medicare Other | Admitting: Anesthesiology

## 2020-07-03 ENCOUNTER — Encounter (HOSPITAL_BASED_OUTPATIENT_CLINIC_OR_DEPARTMENT_OTHER)
Admission: RE | Disposition: A | Discharge status: Home / Self Care | Payer: Self-pay | Source: Home / Self Care | Attending: Orthopaedic Surgery

## 2020-07-03 ENCOUNTER — Ambulatory Visit (HOSPITAL_BASED_OUTPATIENT_CLINIC_OR_DEPARTMENT_OTHER)
Admission: RE | Admit: 2020-07-03 | Discharge: 2020-07-03 | Disposition: A | Discharge status: Home / Self Care | Payer: Medicare Other | Attending: Orthopaedic Surgery | Admitting: Orthopaedic Surgery

## 2020-07-03 DIAGNOSIS — I5032 Chronic diastolic (congestive) heart failure: Secondary | ICD-10-CM | POA: Diagnosis not present

## 2020-07-03 DIAGNOSIS — I10 Essential (primary) hypertension: Secondary | ICD-10-CM | POA: Diagnosis not present

## 2020-07-03 DIAGNOSIS — M109 Gout, unspecified: Secondary | ICD-10-CM | POA: Insufficient documentation

## 2020-07-03 DIAGNOSIS — M65272 Calcific tendinitis, left ankle and foot: Secondary | ICD-10-CM | POA: Diagnosis not present

## 2020-07-03 DIAGNOSIS — Z9884 Bariatric surgery status: Secondary | ICD-10-CM | POA: Diagnosis not present

## 2020-07-03 DIAGNOSIS — Z7901 Long term (current) use of anticoagulants: Secondary | ICD-10-CM | POA: Diagnosis not present

## 2020-07-03 DIAGNOSIS — E785 Hyperlipidemia, unspecified: Secondary | ICD-10-CM | POA: Insufficient documentation

## 2020-07-03 DIAGNOSIS — I110 Hypertensive heart disease with heart failure: Secondary | ICD-10-CM | POA: Diagnosis not present

## 2020-07-03 DIAGNOSIS — Z8673 Personal history of transient ischemic attack (TIA), and cerebral infarction without residual deficits: Secondary | ICD-10-CM | POA: Insufficient documentation

## 2020-07-03 DIAGNOSIS — R2242 Localized swelling, mass and lump, left lower limb: Secondary | ICD-10-CM | POA: Diagnosis not present

## 2020-07-03 DIAGNOSIS — G8918 Other acute postprocedural pain: Secondary | ICD-10-CM | POA: Diagnosis not present

## 2020-07-03 DIAGNOSIS — M2012 Hallux valgus (acquired), left foot: Secondary | ICD-10-CM | POA: Diagnosis not present

## 2020-07-03 DIAGNOSIS — I480 Paroxysmal atrial fibrillation: Secondary | ICD-10-CM | POA: Insufficient documentation

## 2020-07-03 DIAGNOSIS — Z7984 Long term (current) use of oral hypoglycemic drugs: Secondary | ICD-10-CM | POA: Insufficient documentation

## 2020-07-03 DIAGNOSIS — I421 Obstructive hypertrophic cardiomyopathy: Secondary | ICD-10-CM | POA: Insufficient documentation

## 2020-07-03 DIAGNOSIS — E1142 Type 2 diabetes mellitus with diabetic polyneuropathy: Secondary | ICD-10-CM | POA: Insufficient documentation

## 2020-07-03 DIAGNOSIS — Z8249 Family history of ischemic heart disease and other diseases of the circulatory system: Secondary | ICD-10-CM | POA: Insufficient documentation

## 2020-07-03 DIAGNOSIS — M21622 Bunionette of left foot: Secondary | ICD-10-CM | POA: Diagnosis not present

## 2020-07-03 DIAGNOSIS — M71572 Other bursitis, not elsewhere classified, left ankle and foot: Secondary | ICD-10-CM | POA: Diagnosis not present

## 2020-07-03 DIAGNOSIS — Z79899 Other long term (current) drug therapy: Secondary | ICD-10-CM | POA: Insufficient documentation

## 2020-07-03 DIAGNOSIS — M7752 Other enthesopathy of left foot: Secondary | ICD-10-CM | POA: Diagnosis not present

## 2020-07-03 DIAGNOSIS — Z96653 Presence of artificial knee joint, bilateral: Secondary | ICD-10-CM | POA: Insufficient documentation

## 2020-07-03 HISTORY — DX: Polyneuropathy, unspecified: G62.9

## 2020-07-03 HISTORY — PX: BUNIONECTOMY: SHX129

## 2020-07-03 LAB — GLUCOSE, CAPILLARY
Glucose-Capillary: 111 mg/dL — ABNORMAL HIGH (ref 70–99)
Glucose-Capillary: 122 mg/dL — ABNORMAL HIGH (ref 70–99)

## 2020-07-03 SURGERY — BUNIONECTOMY
Anesthesia: General | Site: Foot | Laterality: Left

## 2020-07-03 MED ORDER — CELECOXIB 200 MG PO CAPS
ORAL_CAPSULE | ORAL | Status: AC
  Filled 2020-07-03: qty 1

## 2020-07-03 MED ORDER — AMISULPRIDE (ANTIEMETIC) 5 MG/2ML IV SOLN: 10.0000 mg | Freq: Once | INTRAVENOUS | Status: DC | PRN

## 2020-07-03 MED ORDER — ACETAMINOPHEN 500 MG PO TABS
1000.0000 mg | ORAL_TABLET | Freq: Once | ORAL | Status: AC
  Administered 2020-07-03: 1000 mg via ORAL

## 2020-07-03 MED ORDER — ACETAMINOPHEN 500 MG PO TABS
ORAL_TABLET | ORAL | Status: AC
  Filled 2020-07-03: qty 2

## 2020-07-03 MED ORDER — CEFAZOLIN SODIUM-DEXTROSE 2-4 GM/100ML-% IV SOLN
2.0000 g | INTRAVENOUS | Status: AC
  Administered 2020-07-03: 2 g via INTRAVENOUS

## 2020-07-03 MED ORDER — OXYCODONE HCL 5 MG PO TABS: 5.0000 mg | ORAL_TABLET | ORAL | 0 refills | Status: AC | PRN

## 2020-07-03 MED ORDER — CELECOXIB 200 MG PO CAPS
200.0000 mg | ORAL_CAPSULE | Freq: Once | ORAL | Status: AC
  Administered 2020-07-03: 200 mg via ORAL

## 2020-07-03 MED ORDER — LIDOCAINE 2% (20 MG/ML) 5 ML SYRINGE
INTRAMUSCULAR | Status: DC | PRN
  Administered 2020-07-03: 20 mg via INTRAVENOUS

## 2020-07-03 MED ORDER — MIDAZOLAM HCL 2 MG/2ML IJ SOLN
INTRAMUSCULAR | Status: AC
  Filled 2020-07-03: qty 2

## 2020-07-03 MED ORDER — ONDANSETRON HCL 4 MG/2ML IJ SOLN
INTRAMUSCULAR | Status: DC | PRN
  Administered 2020-07-03: 4 mg via INTRAVENOUS

## 2020-07-03 MED ORDER — FENTANYL CITRATE (PF) 100 MCG/2ML IJ SOLN
INTRAMUSCULAR | Status: DC | PRN
  Administered 2020-07-03: 25 ug via INTRAVENOUS

## 2020-07-03 MED ORDER — CEFAZOLIN SODIUM-DEXTROSE 2-4 GM/100ML-% IV SOLN
INTRAVENOUS | Status: AC
  Filled 2020-07-03: qty 100

## 2020-07-03 MED ORDER — PROPOFOL 10 MG/ML IV BOLUS
INTRAVENOUS | Status: AC
  Filled 2020-07-03: qty 40

## 2020-07-03 MED ORDER — MIDAZOLAM HCL 2 MG/2ML IJ SOLN
1.0000 mg | Freq: Once | INTRAMUSCULAR | Status: AC
  Administered 2020-07-03: 1 mg via INTRAVENOUS

## 2020-07-03 MED ORDER — FENTANYL CITRATE (PF) 100 MCG/2ML IJ SOLN
INTRAMUSCULAR | Status: AC
  Filled 2020-07-03: qty 2

## 2020-07-03 MED ORDER — PROPOFOL 10 MG/ML IV BOLUS
INTRAVENOUS | Status: DC | PRN
  Administered 2020-07-03: 200 mg via INTRAVENOUS

## 2020-07-03 MED ORDER — FENTANYL CITRATE (PF) 100 MCG/2ML IJ SOLN
50.0000 ug | Freq: Once | INTRAMUSCULAR | Status: AC
  Administered 2020-07-03: 50 ug via INTRAVENOUS

## 2020-07-03 MED ORDER — FENTANYL CITRATE (PF) 100 MCG/2ML IJ SOLN: 25.0000 ug | INTRAMUSCULAR | Status: DC | PRN

## 2020-07-03 MED ORDER — BUPIVACAINE-EPINEPHRINE (PF) 0.5% -1:200000 IJ SOLN
INTRAMUSCULAR | Status: DC | PRN
  Administered 2020-07-03: 30 mL via PERINEURAL

## 2020-07-03 MED ORDER — DEXAMETHASONE SODIUM PHOSPHATE 4 MG/ML IJ SOLN
INTRAMUSCULAR | Status: DC | PRN
  Administered 2020-07-03: 4 mg via INTRAVENOUS

## 2020-07-03 MED ORDER — LIDOCAINE 2% (20 MG/ML) 5 ML SYRINGE
INTRAMUSCULAR | Status: AC
  Filled 2020-07-03: qty 5

## 2020-07-03 MED ORDER — LACTATED RINGERS IV SOLN: INTRAVENOUS | Status: DC

## 2020-07-03 SURGICAL SUPPLY — 59 items
APL PRP STRL LF DISP 70% ISPRP (MISCELLANEOUS) ×2
BLADE AVERAGE 25X9 (BLADE) ×1 IMPLANT
BLADE SURG 15 STRL LF DISP TIS (BLADE) ×6 IMPLANT
BLADE SURG 15 STRL SS (BLADE) ×12
BNDG CMPR 9X4 STRL LF SNTH (GAUZE/BANDAGES/DRESSINGS) ×2
BNDG COHESIVE 4X5 TAN STRL (GAUZE/BANDAGES/DRESSINGS) ×2 IMPLANT
BNDG CONFORM 2 STRL LF (GAUZE/BANDAGES/DRESSINGS) ×1 IMPLANT
BNDG ELASTIC 4X5.8 VLCR STR LF (GAUZE/BANDAGES/DRESSINGS) ×3 IMPLANT
BNDG ESMARK 4X9 LF (GAUZE/BANDAGES/DRESSINGS) ×3 IMPLANT
CHLORAPREP W/TINT 26 (MISCELLANEOUS) ×3 IMPLANT
COVER BACK TABLE 60X90IN (DRAPES) ×3 IMPLANT
COVER MAYO STAND STRL (DRAPES) ×2 IMPLANT
COVER WAND RF STERILE (DRAPES) IMPLANT
DECANTER SPIKE VIAL GLASS SM (MISCELLANEOUS) IMPLANT
DRAPE EXTREMITY T 121X128X90 (DISPOSABLE) ×3 IMPLANT
DRAPE IMP U-DRAPE 54X76 (DRAPES) ×3 IMPLANT
DRAPE OEC MINIVIEW 54X84 (DRAPES) ×3 IMPLANT
DRAPE U-SHAPE 47X51 STRL (DRAPES) ×3 IMPLANT
ELECT REM PT RETURN 9FT ADLT (ELECTROSURGICAL) ×3
ELECTRODE REM PT RTRN 9FT ADLT (ELECTROSURGICAL) ×2 IMPLANT
GAUZE SPONGE 4X4 12PLY STRL (GAUZE/BANDAGES/DRESSINGS) ×3 IMPLANT
GAUZE XEROFORM 1X8 LF (GAUZE/BANDAGES/DRESSINGS) ×3 IMPLANT
GLOVE SRG 8 PF TXTR STRL LF DI (GLOVE) ×2 IMPLANT
GLOVE SURG ENC TEXT LTX SZ7.5 (GLOVE) ×3 IMPLANT
GLOVE SURG LTX SZ6.5 (GLOVE) ×6 IMPLANT
GLOVE SURG UNDER POLY LF SZ6.5 (GLOVE) ×2 IMPLANT
GLOVE SURG UNDER POLY LF SZ7 (GLOVE) ×2 IMPLANT
GLOVE SURG UNDER POLY LF SZ8 (GLOVE) ×3
GOWN STRL REUS W/ TWL LRG LVL3 (GOWN DISPOSABLE) ×3 IMPLANT
GOWN STRL REUS W/ TWL XL LVL3 (GOWN DISPOSABLE) ×2 IMPLANT
GOWN STRL REUS W/TWL LRG LVL3 (GOWN DISPOSABLE) ×6
GOWN STRL REUS W/TWL XL LVL3 (GOWN DISPOSABLE) ×3
MANIFOLD NEPTUNE II (INSTRUMENTS) ×2 IMPLANT
NEEDLE HYPO 22GX1.5 SAFETY (NEEDLE) IMPLANT
NS IRRIG 1000ML POUR BTL (IV SOLUTION) ×3 IMPLANT
PACK BASIN DAY SURGERY FS (CUSTOM PROCEDURE TRAY) ×3 IMPLANT
PAD CAST 4YDX4 CTTN HI CHSV (CAST SUPPLIES) ×2 IMPLANT
PADDING CAST COTTON 4X4 STRL (CAST SUPPLIES) ×3
PENCIL SMOKE EVACUATOR (MISCELLANEOUS) ×3 IMPLANT
SHEET MEDIUM DRAPE 40X70 STRL (DRAPES) ×3 IMPLANT
SLEEVE SCD COMPRESS KNEE MED (STOCKING) ×3 IMPLANT
SPONGE LAP 18X18 RF (DISPOSABLE) ×3 IMPLANT
STOCKINETTE 6  STRL (DRAPES) ×3
STOCKINETTE 6 STRL (DRAPES) ×2 IMPLANT
SUCTION FRAZIER HANDLE 10FR (MISCELLANEOUS) ×3
SUCTION TUBE FRAZIER 10FR DISP (MISCELLANEOUS) ×2 IMPLANT
SUT ETHILON 3 0 PS 1 (SUTURE) ×3 IMPLANT
SUT FIBERWIRE 2-0 18 17.9 3/8 (SUTURE)
SUT MNCRL AB 3-0 PS2 18 (SUTURE) ×3 IMPLANT
SUT PDS AB 2-0 CT2 27 (SUTURE) ×3 IMPLANT
SUT VIC AB 2-0 SH 27 (SUTURE)
SUT VIC AB 2-0 SH 27XBRD (SUTURE) IMPLANT
SUT VIC AB 3-0 FS2 27 (SUTURE) IMPLANT
SUTURE FIBERWR 2-0 18 17.9 3/8 (SUTURE) IMPLANT
SYR BULB EAR ULCER 3OZ GRN STR (SYRINGE) ×3 IMPLANT
SYR CONTROL 10ML LL (SYRINGE) IMPLANT
TOWEL GREEN STERILE FF (TOWEL DISPOSABLE) ×6 IMPLANT
TUBE CONNECTING 20X1/4 (TUBING) ×3 IMPLANT
UNDERPAD 30X36 HEAVY ABSORB (UNDERPADS AND DIAPERS) ×3 IMPLANT

## 2020-07-03 NOTE — Progress Notes (Signed)
Assisted Dr. Rodman Comp with left, ultrasound guided, popliteal block. Side rails up, monitors on throughout procedure. See vital signs in flow sheet. Tolerated Procedure well.

## 2020-07-03 NOTE — Discharge Instructions (Signed)
DR. [facility]OT [facility] ANKLE SURGERY POST-OP INSTRUCTIONS   Pain Management 1. The numbing medicine and your leg will last around 18 hours, take a dose of your pain medicine as soon as you feel it wearing off to avoid rebound pain. 2. Keep your foot elevated above heart level.  Make sure that your heel hangs free ('floats'). 3. Take all prescribed medication as directed. 4. If taking narcotic pain medication you may want to use an over-the-counter stool softener to avoid constipation. 5. You may take over-the-counter NSAIDs (ibuprofen, naproxen, etc.) as well as over-the-counter acetaminophen as directed on the packaging as a supplement for your pain and may also use it to wean away from the prescription medication.  Activity ? Heel WB in post operative shoe. ? Keep dressing in place  First Postoperative Visit 1. Your first postop visit will be at least 2 weeks after surgery.  This should be scheduled when you schedule surgery. 2. If you do not have a postoperative visit scheduled please call [phone] to schedule an appointment. 3. At the appointment your incision will be evaluated for suture removal, x-rays will be obtained if necessary.  General Instructions 1. Swelling is very common after foot and ankle surgery.  It often takes 3 months for the foot and ankle to begin to feel comfortable.  Some amount of swelling will persist for 6-12 months. 2. DO NOT change the dressing.  If there is a problem with the dressing (too tight, loose, gets wet, etc.) please contact Dr. Pollie Friar office. 3. DO NOT get the dressing wet.  For showers you can use an over-the-counter cast cover or wrap a washcloth around the top of your dressing and then cover it with a plastic bag and tape it to your leg. 4. DO NOT soak the incision (no tubs, pools, bath, etc.) until you have approval from Dr. Lucia Gaskins.  Contact Dr. Huel Cote office or go to Emergency Room if: 1. Temperature above 101 F. 2. Increasing pain that is  unresponsive to pain medication or elevation 3. Excessive redness or swelling in your foot 4. Dressing problems - excessive bloody drainage, looseness or tightness, or if dressing gets wet 5. Develop pain, swelling, warmth, or discoloration of your calf   May take Tylenol after 1pm, if needed.    Post Anesthesia Home Care Instructions  Activity: Get plenty of rest for the remainder of the day. A responsible individual must stay with you for 24 hours following the procedure.  For the next 24 hours, DO NOT: -Drive a car -Paediatric nurse -Drink alcoholic beverages -Take any medication unless instructed by your physician -Make any legal decisions or sign important papers.  Meals: Start with liquid foods such as gelatin or soup. Progress to regular foods as tolerated. Avoid greasy, spicy, heavy foods. If nausea and/or vomiting occur, drink only clear liquids until the nausea and/or vomiting subsides. Call your physician if vomiting continues.  Special Instructions/Symptoms: Your throat may feel dry or sore from the anesthesia or the breathing tube placed in your throat during surgery. If this causes discomfort, gargle with warm salt water. The discomfort should disappear within 24 hours.  If you had a scopolamine patch placed behind your ear for the management of post- operative nausea and/or vomiting:  1. The medication in the patch is effective for 72 hours, after which it should be removed.  Wrap patch in a tissue and discard in the trash. Wash hands thoroughly with soap and water. 2. You may remove the patch  earlier than 72 hours if you experience unpleasant side effects which may include dry mouth, dizziness or visual disturbances. 3. Avoid touching the patch. Wash your hands with soap and water after contact with the patch.

## 2020-07-03 NOTE — Anesthesia Preprocedure Evaluation (Addendum)
Anesthesia Evaluation  Patient identified by MRN, date of birth, ID band Patient awake    Reviewed: Allergy & Precautions, NPO status , Patient's Chart, lab work & pertinent test results  Airway Mallampati: III  TM Distance: >3 FB Neck ROM: Full    Dental  (+) Dental Advisory Given   Pulmonary sleep apnea and Continuous Positive Airway Pressure Ventilation ,    breath sounds clear to auscultation       Cardiovascular hypertension, Pt. on medications + dysrhythmias Atrial Fibrillation  Rhythm:Regular Rate:Normal     Neuro/Psych  Neuromuscular disease CVA    GI/Hepatic negative GI ROS, Neg liver ROS,   Endo/Other  diabetes, Type 2, Oral Hypoglycemic Agents  Renal/GU negative Renal ROS     Musculoskeletal  (+) Arthritis ,   Abdominal   Peds  Hematology negative hematology ROS (+)   Anesthesia Other Findings   Reproductive/Obstetrics                            Anesthesia Physical Anesthesia Plan  ASA: III  Anesthesia Plan: General   Post-op Pain Management:  Regional for Post-op pain   Induction: Intravenous  PONV Risk Score and Plan: 2 and Dexamethasone, Ondansetron and Treatment may vary due to age or medical condition  Airway Management Planned: LMA  Additional Equipment: None  Intra-op Plan:   Post-operative Plan: Extubation in OR  Informed Consent: I have reviewed the patients History and Physical, chart, labs and discussed the procedure including the risks, benefits and alternatives for the proposed anesthesia with the patient or authorized representative who has indicated his/her understanding and acceptance.     Dental advisory given  Plan Discussed with: CRNA  Anesthesia Plan Comments:         Anesthesia Quick Evaluation

## 2020-07-03 NOTE — Anesthesia Procedure Notes (Signed)
Procedure Name: LMA Insertion Performed by: Genell Thede S, CRNA Pre-anesthesia Checklist: Patient identified, Emergency Drugs available, Suction available and Patient being monitored Patient Re-evaluated:Patient Re-evaluated prior to induction Oxygen Delivery Method: Circle System Utilized Preoxygenation: Pre-oxygenation with 100% oxygen Induction Type: IV induction Ventilation: Mask ventilation without difficulty LMA: LMA inserted LMA Size: 5.0 Number of attempts: 1 Airway Equipment and Method: Bite block Placement Confirmation: positive ETCO2 Tube secured with: Tape Dental Injury: Teeth and Oropharynx as per pre-operative assessment        

## 2020-07-03 NOTE — Anesthesia Procedure Notes (Signed)
Anesthesia Regional Block: Popliteal block   Pre-Anesthetic Checklist: ,, timeout performed, Correct Patient, Correct Site, Correct Laterality, Correct Procedure, Correct Position, site marked, Risks and benefits discussed,  Surgical consent,  Pre-op evaluation,  At surgeon's request and post-op pain management  Laterality: Left  Prep: chloraprep       Needles:  Injection technique: Single-shot  Needle Type: Echogenic Needle     Needle Length: 9cm  Needle Gauge: 21     Additional Needles:   Procedures:,,,, ultrasound used (permanent image in chart),,,,  Narrative:  Start time: 07/03/2020 7:12 AM End time: 07/03/2020 7:18 AM Injection made incrementally with aspirations every 5 mL.  Performed by: Personally  Anesthesiologist: Suzette Battiest, MD

## 2020-07-03 NOTE — Transfer of Care (Signed)
Immediate Anesthesia Transfer of Care Note  Patient: Eddie Zimmerman  Procedure(s) Performed: BUNIONETTE EXCISION, DEBRIDEMENT OF CALCIFIC TENDINITIS AND BURSA (Left Foot)  Patient Location: PACU  Anesthesia Type:GA combined with regional for post-op pain  Level of Consciousness: drowsy  Airway & Oxygen Therapy: Patient Spontanous Breathing and Patient connected to face mask oxygen  Post-op Assessment: Report given to RN and Post -op Vital signs reviewed and stable  Post vital signs: Reviewed and stable  Last Vitals:  Vitals Value Taken Time  BP 120/75 07/03/20 0823  Temp    Pulse 62 07/03/20 0824  Resp 14 07/03/20 0824  SpO2 96 % 07/03/20 0824  Vitals shown include unvalidated device data.  Last Pain:  Vitals:   07/03/20 0658  TempSrc: Oral  PainSc: 1       Patients Stated Pain Goal: 3 (09/81/19 1478)  Complications: No complications documented.

## 2020-07-03 NOTE — Anesthesia Postprocedure Evaluation (Signed)
Anesthesia Post Note  Patient: Eddie Zimmerman  Procedure(s) Performed: BUNIONETTE EXCISION, DEBRIDEMENT OF CALCIFIC TENDINITIS AND BURSA (Left Foot)     Patient location during evaluation: PACU Anesthesia Type: General Level of consciousness: awake and alert Pain management: pain level controlled Vital Signs Assessment: post-procedure vital signs reviewed and stable Respiratory status: spontaneous breathing, nonlabored ventilation, respiratory function stable and patient connected to nasal cannula oxygen Cardiovascular status: blood pressure returned to baseline and stable Postop Assessment: no apparent nausea or vomiting Anesthetic complications: no   No complications documented.  Last Vitals:  Vitals:   07/03/20 0844 07/03/20 0854  BP:  (!) 123/55  Pulse: (!) 58 (!) 58  Resp: 16 16  Temp:  (!) 36.2 C  SpO2: 97% 97%    Last Pain:  Vitals:   07/03/20 0854  TempSrc:   PainSc: 0-No pain                 Tiajuana Amass

## 2020-07-03 NOTE — H&P (Signed)
PREOPERATIVE H&P  Chief Complaint: Left foot pain  HPI: Eddie Zimmerman is a 75 y.o. male who presents for preoperative history and physical with a diagnosis of left foot gout with bunionette and gouty deposition of tophi and plantar distal fifth metatarsal. Patient has failed conservative treatment in the form of injection, shoe modification, anti-inflammatory use and activity modifications with out relief.  He has pain on the plantar aspect of his fifth metatarsal neck and head region as well as laterally about this area.  Symptoms are rated as moderate to severe, and have been worsening.  This is significantly impairing activities of daily living.  He has elected for surgical management.   Past Medical History:  Diagnosis Date  . Acute ischemic left MCA stroke ([place]) 08/21/2014  . Aphasia   . Arthritis    hands, wrist & Knee   . Chronic diastolic heart failure ([place]) 06/08/2014  . Cryptogenic stroke ([place]) 06/29/2014  . CVA (cerebral vascular accident) ([place]) 06/14/2014  . Depression   . Diabetes ([place]) 08/21/2014  . Diabetes mellitus without complication ([place])   . Dysrhythmia    ? Atrial fibrillation episode" Loop Recorder" implanted left chest- [place] follows  . Essential hypertension, benign 04/07/2013  . Gout   . Hearing impaired person, bilateral    hearing aids used  . History of kidney stones LAST MAY 2015  . HLD (hyperlipidemia) 08/21/2014  . Hx of laparoscopic gastric banding    remains in place  . Hypertension   . Hypertensive heart disease 03/17/2016  . Hypertrophic obstructive cardiomyopathy ([place]) 04/07/2013  . Obstructive sleep apnea 04/07/2013  . PAF (paroxysmal atrial fibrillation) ([place]) 06/14/2015  . Peripheral neuropathy   . Sleep apnea    cpap use, SETTING OF 13  . Slurred speech 06/08/2014  . Stroke [facility]) 06-07-2014   Past Surgical History:  Procedure Laterality Date  . ARTHROSCOPIC REPAIR PCL Bilateral    right- x1, left- x3   . COLONOSCOPY WITH PROPOFOL N/A  10/24/2014   Procedure: COLONOSCOPY WITH PROPOFOL;  Surgeon: Garlan Fair, MD;  Location: [facility] ENDOS[facility];  Service: Endoscopy;  Laterality: N/A;  . CYSTOSCOPY WITH RETROGRADE PYELOGRAM, URETEROSCOPY AND STENT PLACEMENT Right 01/31/2016   Procedure: CYSTOSCOPY WITH RETROGRADE PYELOGRAM, URETEROSCOPY AND STENT PLACEMENT;  Surgeon: [name], MD;  Location: [facility] ORS;  Service: Urology;  Laterality: Right;  . CYSTOSCOPY WITH URETHRAL DILATATION N/A 01/31/2016   Procedure: CYSTOSCOPY WITH URETHRAL BALLOON DILATATION;  Surgeon: [name], MD;  Location: [facility] ORS;  Service: Urology;  Laterality: N/A;  . CYSTOSCOPY/URETEROSCOPY/HOLMIUM LASER/STENT PLACEMENT Bilateral 04/14/2016   Procedure: CYSTOSCOPY/URETEROSCOPY   RETROGRADE PYELOGRAM;  Surgeon: [name], MD;  Location: [facility] ORS;  Service: Urology;  Laterality: Bilateral;  . ETHMOIDECTOMY Right 04/30/2018   Procedure: ANTERIOR ETHMOIDECTOMY RIGHT SIDE;  Surgeon: Rozetta Nunnery, MD;  Location: [place];  Service: ENT;  Laterality: Right;  . HERNIA REPAIR     abdominal  . HOLMIUM LASER APPLICATION Right 78/05/4233   Procedure: HOLMIUM LASER APPLICATION;  Surgeon: [name], MD;  Location: [facility] ORS;  Service: Urology;  Laterality: Right;  . JOINT REPLACEMENT Left   . KNEE SURGERY Left   . LAPAROSCOPIC GASTRIC BANDING    . LOOP RECORDER IMPLANT N/A 07/12/2014   Procedure: LOOP RECORDER IMPLANT;  Surgeon: Evans Lance, MD;  Location: [facility] CATH LAB;  Service: Cardiovascular;  Laterality: N/A;  . LOOP RECORDER REMOVAL N/A 08/05/2016   Procedure: Loop Recorder Removal;  Surgeon: Evans Lance, MD;  Location: [place] CV  LAB;  Service: Cardiovascular;  Laterality: N/A;  . MAXILLARY ANTROSTOMY Right 04/30/2018   Procedure: RIGHT MAXILLARY ANTROSTOMY WITH REMOVAL OF TISSUE;  Surgeon: Rozetta Nunnery, MD;  Location: [place];  Service: ENT;  Laterality: Right;  . NASAL SINUS SURGERY Right 04/30/2018   Procedure: FUNCTIONAL ENDOSCOPIC SINUS SURGERY;   Surgeon: Rozetta Nunnery, MD;  Location: [facility] OR;  Service: ENT;  Laterality: Right;  . TARSAL TUNNEL RELEASE Bilateral 2005  . TEE WITHOUT CARDIOVERSION N/A 06/14/2014   Procedure: TRANSESOPHAGEAL ECHOCARDIOGRAM (TEE);  Surgeon: Sueanne Margarita, MD;  Location: [facility] ENDOSCOPY;  Service: Cardiovascular;  Laterality: N/A;  . TONSILLECTOMY    . TOTAL KNEE ARTHROPLASTY Right 06/07/2014   Procedure: TOTAL KNEE ARTHROPLASTY;  Surgeon: Kerin Salen, MD;  Location: [place];  Service: Orthopedics;  Laterality: Right;   Social History   Socioeconomic History  . Marital status: Married    Spouse name: [name]  . Number of children: 3  . Years of education: Master's  . Highest education level: Not on file  Occupational History  . Occupation: [employer]  Tobacco Use  . Smoking status: Never Smoker  . Smokeless tobacco: Never Used  Vaping Use  . Vaping Use: Never used  Substance and Sexual Activity  . Alcohol use: Yes    Alcohol/week: 0.0 standard drinks    Comment: rare 1 PER month  . Drug use: No  . Sexual activity: Not on file  Other Topics Concern  . Not on file  Social History Narrative   Lives at home with wife.   Left handed.    Caffeine use: Drinks 1 glass coffee, tea, soda per day. Varies.   Social Determinants of Health   Financial Resource Strain: Not on file  Food Insecurity: Not on file  Transportation Needs: Not on file  Physical Activity: Not on file  Stress: Not on file  Social Connections: Not on file   Family History  Problem Relation Age of Onset  . AAA (abdominal aortic aneurysm) Mother   . Hypertension Mother   . Alzheimer's disease Father   . Cirrhosis Brother        of the liver  . AAA (abdominal aortic aneurysm) Brother   . Hypertension Brother   . Heart attack Neg Hx    No Known Allergies Prior to Admission medications   Medication Sig Start Date End Date Taking? Authorizing Provider  allopurinol (ZYLOPRIM) 100 MG tablet Take 100 mg by mouth every  morning.   Yes [provider]  allopurinol (ZYLOPRIM) 300 MG tablet Take 300 mg by mouth every morning.   Yes [provider]  Coenzyme Q10 (COQ-10) 200 MG CAPS Take 200 mg by mouth every morning.   Yes [provider]  Cyanocobalamin (VITAMIN B-12 PO) Take 1 tablet by mouth every morning.   Yes [provider]  escitalopram (LEXAPRO) 20 MG tablet Take 20 mg by mouth every morning.   Yes [provider]  hydrochlorothiazide (MICROZIDE) 12.5 MG capsule Take 12.5 mg by mouth every morning.    Yes [provider]  hydroxychloroquine (PLAQUENIL) 200 MG tablet Take 200 mg by mouth 2 (two) times daily. 12/17/17  Yes [provider]  lisinopril (ZESTRIL) 40 MG tablet TAKE ONE-HALF (1/2) TABLET TWICE A DAY (MUST KEEP UPCOMING IN SEPTEMBER BEFORE ANYMORE REFILLS) 02/27/20  Yes Jettie Booze, MD  metFORMIN (GLUCOPHAGE) 1000 MG tablet Take 1,000 mg by mouth 2 (two) times daily.   Yes [provider]  Multiple Vitamin (  MULTIVITAMIN WITH MINERALS) TABS tablet Take 1 tablet by mouth every morning. Men's One a Day   Yes [provider]  Polyvinyl Alcohol-Povidone (CLEAR EYES NATURAL TEARS OP) Place 1 drop into both eyes 2 (two) times daily as needed (dry eyes).   Yes [provider]  pregabalin (LYRICA) 100 MG capsule Take 100 mg by mouth 3 (three) times daily.   Yes [provider]  PRESCRIPTION MEDICATION Inhale into the lungs at bedtime. CPAP   Yes [provider]  rosuvastatin (CRESTOR) 5 MG tablet Take 1 tablet (5 mg total) by mouth daily at 6 PM. 02/10/20  Yes Jettie Booze, MD  XARELTO 20 MG TABS tablet TAKE 1 TABLET DAILY WITH SUPPER 02/20/20  Yes Jettie Booze, MD  amoxicillin (AMOXIL) 500 MG capsule Take 2,000 mg by mouth See admin instructions. Take 4 capsules (2000 mg) by mouth one hour prior to dental procedure 01/01/16   [provider]     Positive ROS: All other  systems have been reviewed and were otherwise negative with the exception of those mentioned in the HPI and as above.  Physical Exam:  Vitals:   07/03/20 0658  BP: 114/67  Pulse: (!) 54  Resp: 18  Temp: 97.9 F (36.6 C)  SpO2: 95%   General: Alert, no acute distress Cardiovascular: No pedal edema Respiratory: No cyanosis, no use of accessory musculature GI: No organomegaly, abdomen is soft and non-tender Skin: No lesions in the area of chief complaint Neurologic: Sensation intact distally Psychiatric: Patient is competent for consent with normal mood and affect Lymphatic: No axillary or cervical lymphadenopathy  MUSCULOSKELETAL: Left foot demonstrates bunion deformity.  Some evidence of swelling in this area.  Tenderness to palpation the plantar lateral aspect of the fifth metatarsal head and neck region.  No gross toe deformities.  Distally sensation grossly intact to light touch.  Foot is warm and well-perfused.  Assessment: Left foot bunionette with gouty deposition plantarly   Plan: Plan for bunionette excision with bursectomy and gouty tophi excision.  We will send the material to the pathology lab for definitive diagnosis.  He will be placed in a soft dressing and allowed to heal weight-bear in the boot which he has at home.  We discussed the risks, benefits and alternatives of surgery which include but are not limited to wound healing complications, infection, nonunion, malunion, need for further surgery, damage to surrounding structures and continued pain.  They understand there is no guarantees to an acceptable outcome.  After weighing these risks they opted to proceed with surgery.     Erle Crocker, MD    07/03/2020 7:13 AM

## 2020-07-05 ENCOUNTER — Encounter (HOSPITAL_BASED_OUTPATIENT_CLINIC_OR_DEPARTMENT_OTHER): Payer: Self-pay | Admitting: Orthopaedic Surgery

## 2020-07-05 DIAGNOSIS — G603 Idiopathic progressive neuropathy: Secondary | ICD-10-CM | POA: Diagnosis not present

## 2020-07-05 DIAGNOSIS — R202 Paresthesia of skin: Secondary | ICD-10-CM | POA: Diagnosis not present

## 2020-07-05 DIAGNOSIS — R208 Other disturbances of skin sensation: Secondary | ICD-10-CM | POA: Diagnosis not present

## 2020-07-09 LAB — SURGICAL PATHOLOGY

## 2020-07-13 NOTE — Op Note (Signed)
Eddie Zimmerman male 75 y.o. 07/13/2020  PreOperative Diagnosis: Left foot bunionette Left foot bursitis Left foot deep calcific mass   PostOperative Diagnosis: Same  PROCEDURE: Left foot bunionette excision Debridement of calcific tendinitis and bursa  SURGEON: [name], MD  ASSISTANT: None  ANESTHESIA: General LMA anesthesia with peripheral nerve block  FINDINGS: Left foot bunionette with large calcific mass and bursitis of the fifth metatarsal neck and head region plantarly  IMPLANTS: None  INDICATIONS:74 y.o. male had pain along the lateral aspect of his foot and plantarly.  X-rays revealed a large area of calcific bursa and tendinitis as well as bunionette deformity.  He had failed conservative treatment form of shoe modifications, inserts, cushioning and had continued pain.  Given this he was indicated for surgery.  Patient did have a history of gout.   Patient understood the risks, benefits and alternatives to surgery which include but are not limited to wound healing complications, infection, nonunion, malunion, need for further surgery as well as damage to surrounding structures. They also understood the potential for continued pain in that there were no guarantees of acceptable outcome After weighing these risks the patient opted to proceed with surgery.  PROCEDURE: Patient was identified in the preoperative holding area.  The left foot was marked by myself.  Consent was signed by myself and the patient.  Block was performed by anesthesia in the preoperative holding area.  Patient was taken to the operative suite and placed supine on the operative table.  General LMA anesthesia was induced without difficulty. Bump was placed under the operative hip and bone foam was used.  All bony prominences were well padded.  Preoperative antibiotics were given. The extremity was prepped and draped in the usual sterile fashion and surgical timeout was performed.  A 4 inch Esmarch  tourniquet was placed without difficulty.  We began by making a longitudinal incision overlying the lateral border of the distal fifth metatarsal.  This was taken sharply down through skin and subcutaneous tissue.  Blunt dissection was used to identify plantar nerve in this area that was protected.  Then the capsular tissue overlying the fifth MTP joint was identified and incised in line with the incision.  The capsular tissue was mobilized dorsally and plantarly.  The bony prominence was noted of the distal fifth metatarsal.  Using a sagittal saw this prominence was removed and discarded.  Any surrounding sharp edges of bone were smoothed with a rondure.  A separate deep incision was then carried between the capsular tissue and the underlying tendinous structures.  There is a large 1 cm x 5 cm calcific portion of bursal sac and apparent gouty tophus.  This mass was then circumferentially excised.  The surrounding inflamed bursal tissue was also excised.  The mass was sent for pathology via routine.  This area was then inspected.  There was no further evidence of prominence or masslike structures in this area.  The fifth toe was taken through range of motion and found to have good motion with adequate tendon gliding.  Then using a 3-0 Monocryl suture the capsular tissue was repaired in an interrupted horizontal mattress technique.  The wound was then irrigated copiously with normal saline.  The wound was closed in a layered fashion using 3-0 Monocryl and 3-0 nylon suture.  Soft dressing was placed.  Patient was awakened from anesthesia and taken to recovery in stable condition.  There were no complications.  Counts were correct at the end of the case.  POST OPERATIVE INSTRUCTIONS: Weightbearing as tolerated in a postoperative shoe Follow-up in 2 weeks for wound check and suture removal if appropriate.  He will be able to go back into regular shoes at that point  BLOOD LOSS:  Minimal          DRAINS: none         SPECIMEN: none       COMPLICATIONS:  * No complications entered in OR log *         Disposition: PACU - hemodynamically stable.         Condition: stable

## 2020-08-20 ENCOUNTER — Other Ambulatory Visit: Payer: Self-pay | Admitting: Interventional Cardiology

## 2020-08-20 NOTE — Telephone Encounter (Signed)
Prescription refill request for Xarelto received.  Indication: afib Last office visit: 12/30/2019, Irish Lack Weight: 105.4 kg Age: 75 yo  Scr: 1.12,  07/03/2019 CrCl: 30ml/min   Pt is on the correct dose of Xarelto per dosing criteria, prescription refill sent for Xarelto 20mg  daily.

## 2020-08-24 DIAGNOSIS — T148XXA Other injury of unspecified body region, initial encounter: Secondary | ICD-10-CM | POA: Diagnosis not present

## 2020-08-24 DIAGNOSIS — S00412A Abrasion of left ear, initial encounter: Secondary | ICD-10-CM | POA: Diagnosis not present

## 2020-08-24 DIAGNOSIS — S0990XA Unspecified injury of head, initial encounter: Secondary | ICD-10-CM | POA: Diagnosis not present

## 2020-08-24 DIAGNOSIS — I10 Essential (primary) hypertension: Secondary | ICD-10-CM | POA: Diagnosis not present

## 2020-08-24 DIAGNOSIS — S63501A Unspecified sprain of right wrist, initial encounter: Secondary | ICD-10-CM | POA: Diagnosis not present

## 2020-08-24 DIAGNOSIS — S199XXA Unspecified injury of neck, initial encounter: Secondary | ICD-10-CM | POA: Diagnosis not present

## 2020-08-24 DIAGNOSIS — S50811A Abrasion of right forearm, initial encounter: Secondary | ICD-10-CM | POA: Diagnosis not present

## 2020-08-24 DIAGNOSIS — S6991XA Unspecified injury of right wrist, hand and finger(s), initial encounter: Secondary | ICD-10-CM | POA: Diagnosis not present

## 2020-08-24 DIAGNOSIS — Z8673 Personal history of transient ischemic attack (TIA), and cerebral infarction without residual deficits: Secondary | ICD-10-CM | POA: Diagnosis not present

## 2020-08-24 DIAGNOSIS — E119 Type 2 diabetes mellitus without complications: Secondary | ICD-10-CM | POA: Diagnosis not present

## 2020-10-01 DIAGNOSIS — X32XXXD Exposure to sunlight, subsequent encounter: Secondary | ICD-10-CM | POA: Diagnosis not present

## 2020-10-01 DIAGNOSIS — Z1283 Encounter for screening for malignant neoplasm of skin: Secondary | ICD-10-CM | POA: Diagnosis not present

## 2020-10-01 DIAGNOSIS — L570 Actinic keratosis: Secondary | ICD-10-CM | POA: Diagnosis not present

## 2020-10-01 DIAGNOSIS — L820 Inflamed seborrheic keratosis: Secondary | ICD-10-CM | POA: Diagnosis not present

## 2020-10-01 DIAGNOSIS — L821 Other seborrheic keratosis: Secondary | ICD-10-CM | POA: Diagnosis not present

## 2020-10-08 ENCOUNTER — Telehealth: Payer: Self-pay | Admitting: *Deleted

## 2020-10-08 DIAGNOSIS — L600 Ingrowing nail: Secondary | ICD-10-CM | POA: Diagnosis not present

## 2020-10-08 NOTE — Telephone Encounter (Signed)
    Eddie Zimmerman DOB:  1946-01-17  MRN:  [id number]   Primary Cardiologist: Larae Grooms, MD  Chart reviewed as part of pre-operative protocol coverage. Given past medical history and time since last visit, based on ACC/AHA guidelines, [name] would be at acceptable risk for the planned procedure without further cardiovascular testing.   Pt previously cleared for foot surgery 06/2020 without difficulty. Request is now for surgical removal of damaged toenail.   Per pharmacy team:  Patient with diagnosis of atrial fibrillation on Xarelto for anticoagulation.     Procedure: surgical removal of damaged toenail Date of procedure: TBD   CHA2DS2-VASc Score = 6  This indicates a 9.7% annual risk of stroke. The patient's score is based upon: CHF History: Yes HTN History: Yes Diabetes History: Yes Stroke History: Yes Vascular Disease History: No Age Score: 1 Gender Score: 0   CrCl 73.4 (with adjusted body weight) Platelet count 210   Per office protocol, patient can hold Xarelto for 2 days prior to procedure. Patient will not need bridging with Lovenox (enoxaparin) around procedure.  The patient was advised that if he develops new symptoms prior to surgery to contact our office to arrange for a follow-up visit, and he verbalized understanding.  I will route this recommendation to the requesting party via Epic fax function and remove from pre-op pool.  Please call with questions.  Kathyrn Drown, NP 10/08/2020, 1:22 PM

## 2020-10-08 NOTE — Telephone Encounter (Signed)
Patient with diagnosis of atrial fibrillation on Xarelto for anticoagulation.    Procedure: surgical removal of damaged toenail Date of procedure: TBD   CHA2DS2-VASc Score = 6  This indicates a 9.7% annual risk of stroke. The patient's score is based upon: CHF History: Yes HTN History: Yes Diabetes History: Yes Stroke History: Yes Vascular Disease History: No Age Score: 1 Gender Score: 0   CrCl 73.4 (with adjusted body weight) Platelet count 210  Per office protocol, patient can hold Xarelto for 2 days prior to procedure.   Patient will not need bridging with Lovenox (enoxaparin) around procedure.

## 2020-10-08 NOTE — Telephone Encounter (Signed)
   [place] Pre-operative Risk Assessment    Patient Name: Eddie Zimmerman  DOB: December 31, 1945  MRN: [id number]   HEARTCARE STAFF: - Please ensure there is not already an duplicate clearance open for this procedure. - Under Visit Info/Reason for Call, type in Other and utilize the format Clearance MM/DD/YY or Clearance TBD. Do not use dashes or single digits. - If request is for dental extraction, please clarify the # of teeth to be extracted. - If the patient is currently at the dentist's office, call Pre-Op APP to address. If the patient is not currently in the dentist office, please route to the Pre-Op pool  Request for surgical clearance:  What type of surgery is being performed? SURGICAL REMOVAL OF DAMAGED TOENAIL   When is this surgery scheduled? TBD   What type of clearance is required (medical clearance vs. Pharmacy clearance to hold med vs. Both)? BOTH  Are there any medications that need to be held prior to surgery and how long? [facility]   Practice name and name of physician performing surgery? [facility]; DR. Coralie Common   What is the office phone number? [phone]   7.   What is the office fax number? [phone]  8.   Anesthesia type (None, local, MAC, general) ? NOT LISTED   Julaine Hua 10/08/2020, 12:16 PM  _________________________________________________________________   (provider comments below)

## 2020-10-11 DIAGNOSIS — R202 Paresthesia of skin: Secondary | ICD-10-CM | POA: Diagnosis not present

## 2020-10-11 DIAGNOSIS — Z79899 Other long term (current) drug therapy: Secondary | ICD-10-CM | POA: Diagnosis not present

## 2020-10-11 DIAGNOSIS — M545 Low back pain, unspecified: Secondary | ICD-10-CM | POA: Diagnosis not present

## 2020-10-12 NOTE — Telephone Encounter (Signed)
    Pt said [facility] has not receive clearance yet, he requested to re-fax clearance today to [facility] doctor at [facility]

## 2020-10-12 NOTE — Telephone Encounter (Signed)
S/w own with DR. Coralie Common office. I confirmed with Own that clearance from our office has been received.

## 2020-10-16 DIAGNOSIS — L600 Ingrowing nail: Secondary | ICD-10-CM | POA: Diagnosis not present

## 2020-11-13 DIAGNOSIS — J324 Chronic pansinusitis: Secondary | ICD-10-CM | POA: Diagnosis not present

## 2020-11-13 DIAGNOSIS — J310 Chronic rhinitis: Secondary | ICD-10-CM | POA: Diagnosis not present

## 2020-11-13 DIAGNOSIS — J342 Deviated nasal septum: Secondary | ICD-10-CM | POA: Diagnosis not present

## 2020-11-19 DIAGNOSIS — Z79899 Other long term (current) drug therapy: Secondary | ICD-10-CM | POA: Diagnosis not present

## 2020-11-19 DIAGNOSIS — M1009 Idiopathic gout, multiple sites: Secondary | ICD-10-CM | POA: Diagnosis not present

## 2020-11-19 DIAGNOSIS — Z6833 Body mass index (BMI) 33.0-33.9, adult: Secondary | ICD-10-CM | POA: Diagnosis not present

## 2020-11-19 DIAGNOSIS — E669 Obesity, unspecified: Secondary | ICD-10-CM | POA: Diagnosis not present

## 2020-11-19 DIAGNOSIS — M0600 Rheumatoid arthritis without rheumatoid factor, unspecified site: Secondary | ICD-10-CM | POA: Diagnosis not present

## 2020-11-19 DIAGNOSIS — M150 Primary generalized (osteo)arthritis: Secondary | ICD-10-CM | POA: Diagnosis not present

## 2020-12-06 DIAGNOSIS — S62627A Displaced fracture of medial phalanx of left little finger, initial encounter for closed fracture: Secondary | ICD-10-CM | POA: Diagnosis not present

## 2020-12-10 DIAGNOSIS — Y999 Unspecified external cause status: Secondary | ICD-10-CM | POA: Diagnosis not present

## 2020-12-10 DIAGNOSIS — S62617A Displaced fracture of proximal phalanx of left little finger, initial encounter for closed fracture: Secondary | ICD-10-CM | POA: Diagnosis not present

## 2020-12-10 DIAGNOSIS — S62627A Displaced fracture of medial phalanx of left little finger, initial encounter for closed fracture: Secondary | ICD-10-CM | POA: Diagnosis not present

## 2020-12-10 DIAGNOSIS — X58XXXA Exposure to other specified factors, initial encounter: Secondary | ICD-10-CM | POA: Diagnosis not present

## 2020-12-10 DIAGNOSIS — S63237A Subluxation of proximal interphalangeal joint of left little finger, initial encounter: Secondary | ICD-10-CM | POA: Diagnosis not present

## 2020-12-13 DIAGNOSIS — N200 Calculus of kidney: Secondary | ICD-10-CM | POA: Diagnosis not present

## 2020-12-13 DIAGNOSIS — K862 Cyst of pancreas: Secondary | ICD-10-CM | POA: Diagnosis not present

## 2020-12-14 DIAGNOSIS — H25011 Cortical age-related cataract, right eye: Secondary | ICD-10-CM | POA: Diagnosis not present

## 2020-12-14 DIAGNOSIS — D3132 Benign neoplasm of left choroid: Secondary | ICD-10-CM | POA: Diagnosis not present

## 2020-12-14 DIAGNOSIS — H2513 Age-related nuclear cataract, bilateral: Secondary | ICD-10-CM | POA: Diagnosis not present

## 2020-12-14 DIAGNOSIS — E113293 Type 2 diabetes mellitus with mild nonproliferative diabetic retinopathy without macular edema, bilateral: Secondary | ICD-10-CM | POA: Diagnosis not present

## 2020-12-18 DIAGNOSIS — S62627D Displaced fracture of medial phalanx of left little finger, subsequent encounter for fracture with routine healing: Secondary | ICD-10-CM | POA: Diagnosis not present

## 2020-12-18 DIAGNOSIS — Z4889 Encounter for other specified surgical aftercare: Secondary | ICD-10-CM | POA: Diagnosis not present

## 2020-12-18 DIAGNOSIS — S62627A Displaced fracture of medial phalanx of left little finger, initial encounter for closed fracture: Secondary | ICD-10-CM | POA: Diagnosis not present

## 2020-12-20 DIAGNOSIS — R269 Unspecified abnormalities of gait and mobility: Secondary | ICD-10-CM | POA: Diagnosis not present

## 2020-12-20 DIAGNOSIS — G603 Idiopathic progressive neuropathy: Secondary | ICD-10-CM | POA: Diagnosis not present

## 2020-12-20 DIAGNOSIS — M5417 Radiculopathy, lumbosacral region: Secondary | ICD-10-CM | POA: Diagnosis not present

## 2020-12-20 DIAGNOSIS — G5603 Carpal tunnel syndrome, bilateral upper limbs: Secondary | ICD-10-CM | POA: Diagnosis not present

## 2020-12-20 DIAGNOSIS — G3184 Mild cognitive impairment, so stated: Secondary | ICD-10-CM | POA: Diagnosis not present

## 2020-12-20 DIAGNOSIS — Z79899 Other long term (current) drug therapy: Secondary | ICD-10-CM | POA: Diagnosis not present

## 2020-12-20 DIAGNOSIS — G5623 Lesion of ulnar nerve, bilateral upper limbs: Secondary | ICD-10-CM | POA: Diagnosis not present

## 2020-12-21 ENCOUNTER — Other Ambulatory Visit: Payer: Self-pay | Admitting: Specialist

## 2020-12-21 DIAGNOSIS — M5417 Radiculopathy, lumbosacral region: Secondary | ICD-10-CM

## 2020-12-27 DIAGNOSIS — S62627A Displaced fracture of medial phalanx of left little finger, initial encounter for closed fracture: Secondary | ICD-10-CM | POA: Diagnosis not present

## 2021-01-01 DIAGNOSIS — E663 Overweight: Secondary | ICD-10-CM | POA: Diagnosis not present

## 2021-01-01 DIAGNOSIS — I639 Cerebral infarction, unspecified: Secondary | ICD-10-CM | POA: Diagnosis not present

## 2021-01-01 DIAGNOSIS — M109 Gout, unspecified: Secondary | ICD-10-CM | POA: Diagnosis not present

## 2021-01-01 DIAGNOSIS — S62627D Displaced fracture of medial phalanx of left little finger, subsequent encounter for fracture with routine healing: Secondary | ICD-10-CM | POA: Diagnosis not present

## 2021-01-01 DIAGNOSIS — F330 Major depressive disorder, recurrent, mild: Secondary | ICD-10-CM | POA: Diagnosis not present

## 2021-01-01 DIAGNOSIS — E7800 Pure hypercholesterolemia, unspecified: Secondary | ICD-10-CM | POA: Diagnosis not present

## 2021-01-01 DIAGNOSIS — I480 Paroxysmal atrial fibrillation: Secondary | ICD-10-CM | POA: Diagnosis not present

## 2021-01-01 DIAGNOSIS — Z4889 Encounter for other specified surgical aftercare: Secondary | ICD-10-CM | POA: Diagnosis not present

## 2021-01-01 DIAGNOSIS — Z7984 Long term (current) use of oral hypoglycemic drugs: Secondary | ICD-10-CM | POA: Diagnosis not present

## 2021-01-01 DIAGNOSIS — E1140 Type 2 diabetes mellitus with diabetic neuropathy, unspecified: Secondary | ICD-10-CM | POA: Diagnosis not present

## 2021-01-01 DIAGNOSIS — Z0000 Encounter for general adult medical examination without abnormal findings: Secondary | ICD-10-CM | POA: Diagnosis not present

## 2021-01-01 DIAGNOSIS — G4730 Sleep apnea, unspecified: Secondary | ICD-10-CM | POA: Diagnosis not present

## 2021-01-01 DIAGNOSIS — I10 Essential (primary) hypertension: Secondary | ICD-10-CM | POA: Diagnosis not present

## 2021-01-01 DIAGNOSIS — Z23 Encounter for immunization: Secondary | ICD-10-CM | POA: Diagnosis not present

## 2021-01-02 ENCOUNTER — Other Ambulatory Visit: Payer: Self-pay

## 2021-01-02 ENCOUNTER — Ambulatory Visit
Admission: RE | Admit: 2021-01-02 | Discharge: 2021-01-02 | Disposition: A | Discharge status: Home / Self Care | Payer: Medicare Other | Source: Ambulatory Visit | Attending: Specialist | Admitting: Specialist

## 2021-01-02 DIAGNOSIS — M545 Low back pain, unspecified: Secondary | ICD-10-CM | POA: Diagnosis not present

## 2021-01-02 DIAGNOSIS — M48061 Spinal stenosis, lumbar region without neurogenic claudication: Secondary | ICD-10-CM | POA: Diagnosis not present

## 2021-01-02 DIAGNOSIS — M5417 Radiculopathy, lumbosacral region: Secondary | ICD-10-CM

## 2021-01-07 DIAGNOSIS — G8929 Other chronic pain: Secondary | ICD-10-CM | POA: Diagnosis not present

## 2021-01-07 DIAGNOSIS — M533 Sacrococcygeal disorders, not elsewhere classified: Secondary | ICD-10-CM | POA: Diagnosis not present

## 2021-01-07 DIAGNOSIS — M545 Low back pain, unspecified: Secondary | ICD-10-CM | POA: Diagnosis not present

## 2021-01-10 DIAGNOSIS — M533 Sacrococcygeal disorders, not elsewhere classified: Secondary | ICD-10-CM | POA: Diagnosis not present

## 2021-01-10 DIAGNOSIS — S62627D Displaced fracture of medial phalanx of left little finger, subsequent encounter for fracture with routine healing: Secondary | ICD-10-CM | POA: Diagnosis not present

## 2021-01-14 DIAGNOSIS — K862 Cyst of pancreas: Secondary | ICD-10-CM | POA: Diagnosis not present

## 2021-01-17 ENCOUNTER — Other Ambulatory Visit: Payer: Self-pay | Admitting: General Surgery

## 2021-01-17 DIAGNOSIS — K862 Cyst of pancreas: Secondary | ICD-10-CM

## 2021-01-17 DIAGNOSIS — Z4889 Encounter for other specified surgical aftercare: Secondary | ICD-10-CM | POA: Diagnosis not present

## 2021-01-17 DIAGNOSIS — S62627D Displaced fracture of medial phalanx of left little finger, subsequent encounter for fracture with routine healing: Secondary | ICD-10-CM | POA: Diagnosis not present

## 2021-01-28 DIAGNOSIS — S62627D Displaced fracture of medial phalanx of left little finger, subsequent encounter for fracture with routine healing: Secondary | ICD-10-CM | POA: Diagnosis not present

## 2021-01-28 DIAGNOSIS — Z4889 Encounter for other specified surgical aftercare: Secondary | ICD-10-CM | POA: Diagnosis not present

## 2021-01-29 NOTE — Progress Notes (Signed)
Cardiology Office Note   Date:  01/30/2021   ID:  Eddie Zimmerman, DOB 1945/08/13, MRN [id number]  PCP:  [name], MD    No chief complaint on file.  PAF  Wt Readings from Last 3 Encounters:  01/30/21 228 lb 6.4 oz (103.6 kg)  07/03/20 232 lb 5.8 oz (105.4 kg)  12/30/19 236 lb (107 kg)       History of Present Illness: Eddie Zimmerman is a 75 y.o. male   with a h/o hyperlipidemia.  He had a h/o obesity s/p lap band surgery and has lost about 60+ lbs.    Negative stress test in 2010.     He has has OSA.   He had a cryptogenic stroke with ILR placed 3/16. Brief episode of  asymptomatic Afib was noted in 8/16 and pt was referred the Afib clinic  to discuss anticoagulation.  He was started on xarelto due to the strokes.   His LINQ monitor was removed in 2018.     In the past,I noted: " He is walking some, more the elliptical 25-30 minutes a few times a week.  He does some weights as well, so he gets over 150 minutes a week."   In 2020, he had a nasal surgery to help with some drainage.  He had a staph infection in his nose.  He has been on multiple antibiotics but the infection did not clear.    In late 2020, he was able to resume regular walking for 50 to 60 minutes and started going back to the gym.   Felt better after seeing Dr. Benjamine Mola.    Calcium scoring CT: "Ascending aorta: Upper normal diameter 3.7 cm   Pericardium: Normal   Coronary arteries: Calcium noted throughout the LAD as well as proximal and mid RCA   IMPRESSION: Coronary calcium score of 107. This was 46 rd percentile for age and sex matched control."  Still goes to the gym.  Had a broken pinky after a fall while at the gym.  Had multiple pins and surgery.   Denies : Chest pain. Dizziness. Leg edema. Nitroglycerin use. Orthopnea. Palpitations. Paroxysmal nocturnal dyspnea. Shortness of breath. Syncope.    Past Medical History:  Diagnosis Date   Acute ischemic left MCA stroke ([place]) 08/21/2014    Aphasia    Arthritis    hands, wrist & Knee    Chronic diastolic heart failure ([place]) 06/08/2014   Cryptogenic stroke ([place]) 06/29/2014   CVA (cerebral vascular accident) ([place]) 06/14/2014   Depression    Diabetes ([place]) 08/21/2014   Diabetes mellitus without complication ([place])    Dysrhythmia    ? Atrial fibrillation episode" Loop Recorder" implanted left chest- Nellie follows   Essential hypertension, benign 04/07/2013   Gout    Hearing impaired person, bilateral    hearing aids used   History of kidney stones LAST MAY 2015   HLD (hyperlipidemia) 08/21/2014   Hx of laparoscopic gastric banding    remains in place   Hypertension    Hypertensive heart disease 03/17/2016   Hypertrophic obstructive cardiomyopathy ([place]) 04/07/2013   Obstructive sleep apnea 04/07/2013   PAF (paroxysmal atrial fibrillation) ([place]) 06/14/2015   Peripheral neuropathy    Sleep apnea    cpap use, SETTING OF 13   Slurred speech 06/08/2014   Stroke ([place]) 06-07-2014    Past Surgical History:  Procedure Laterality Date   ARTHROSCOPIC REPAIR PCL Bilateral    right- x1, left- x3    BUNIONECTOMY  Left 07/03/2020   Procedure: BUNIONETTE EXCISION, DEBRIDEMENT OF CALCIFIC TENDINITIS AND BURSA;  Surgeon: Erle Crocker, MD;  Location: [facility];  Service: Orthopedics;  Laterality: Left;  LENGTH OF SURGERY: 1.5 HOURS   COLONOSCOPY WITH PROPOFOL N/A 10/24/2014   Procedure: COLONOSCOPY WITH PROPOFOL;  Surgeon: Garlan Fair, MD;  [facility];  Service: Endoscopy;  Laterality: N/A;   CYSTOSCOPY WITH RETROGRADE PYELOGRAM, URETEROSCOPY AND STENT PLACEMENT Right 01/31/2016   Procedure: CYSTOSCOPY WITH RETROGRADE PYELOGRAM, URETEROSCOPY AND STENT PLACEMENT;  Surgeon: [name], MD;  [facility];  Service: Urology;  Laterality: Right;   CYSTOSCOPY WITH URETHRAL DILATATION N/A 01/31/2016   Procedure: CYSTOSCOPY WITH URETHRAL BALLOON DILATATION;  Surgeon: [name], MD;  [facility];   Service: Urology;  Laterality: N/A;   CYSTOSCOPY/URETEROSCOPY/HOLMIUM LASER/STENT PLACEMENT Bilateral 04/14/2016   Procedure: CYSTOSCOPY/URETEROSCOPY   RETROGRADE PYELOGRAM;  Surgeon: [name], MD;  [facility];  Service: Urology;  Laterality: Bilateral;   ETHMOIDECTOMY Right 04/30/2018   Procedure: ANTERIOR ETHMOIDECTOMY RIGHT SIDE;  Surgeon: Rozetta Nunnery, MD;  Location: [facility];  Service: ENT;  Laterality: Right;   HERNIA REPAIR     abdominal   HOLMIUM LASER APPLICATION Right 62/11/3149   Procedure: HOLMIUM LASER APPLICATION;  Surgeon: [name], MD;  [facility];  Service: Urology;  Laterality: Right;   JOINT REPLACEMENT Left    KNEE SURGERY Left    LAPAROSCOPIC GASTRIC BANDING     LOOP RECORDER IMPLANT N/A 07/12/2014   Procedure: LOOP RECORDER IMPLANT;  Surgeon: Evans Lance, MD;  Location: [facility];  Service: Cardiovascular;  Laterality: N/A;   LOOP RECORDER REMOVAL N/A 08/05/2016   Procedure: Loop Recorder Removal;  Surgeon: Evans Lance, MD;  Location: [facility];  Service: Cardiovascular;  Laterality: N/A;   MAXILLARY ANTROSTOMY Right 04/30/2018   Procedure: RIGHT MAXILLARY ANTROSTOMY WITH REMOVAL OF TISSUE;  Surgeon: Rozetta Nunnery, MD;  Location: [facility];  Service: ENT;  Laterality: Right;   NASAL SINUS SURGERY Right 04/30/2018   Procedure: FUNCTIONAL ENDOSCOPIC SINUS SURGERY;  Surgeon: Rozetta Nunnery, MD;  Location: [facility];  Service: ENT;  Laterality: Right;   TARSAL TUNNEL RELEASE Bilateral 2005   TEE WITHOUT CARDIOVERSION N/A 06/14/2014   Procedure: TRANSESOPHAGEAL ECHOCARDIOGRAM (TEE);  Surgeon: Sueanne Margarita, MD;  Location: [facility];  Service: Cardiovascular;  Laterality: N/A;   TONSILLECTOMY     TOTAL KNEE ARTHROPLASTY Right 06/07/2014   Procedure: TOTAL KNEE ARTHROPLASTY;  Surgeon: Kerin Salen, MD;  Location: [facility];  Service: Orthopedics;  Laterality: Right;     Current Outpatient Medications  Medication Sig Dispense  Refill   allopurinol (ZYLOPRIM) 100 MG tablet Take 100 mg by mouth every morning.     allopurinol (ZYLOPRIM) 300 MG tablet Take 300 mg by mouth every morning.     amoxicillin (AMOXIL) 500 MG capsule Take 2,000 mg by mouth See admin instructions. Take 4 capsules (2000 mg) by mouth one hour prior to dental procedure     Coenzyme Q10 (COQ-10) 200 MG CAPS Take 200 mg by mouth every morning.     Cyanocobalamin (VITAMIN B-12 PO) Take 1 tablet by mouth every morning.     escitalopram (LEXAPRO) 20 MG tablet Take 20 mg by mouth every morning.     hydrochlorothiazide (MICROZIDE) 12.5 MG capsule Take 12.5 mg by mouth every morning.      hydroxychloroquine (PLAQUENIL) 200 MG tablet Take 200 mg by mouth 2 (two) times daily.  lisinopril (ZESTRIL) 40 MG tablet TAKE ONE-HALF (1/2) TABLET TWICE A DAY (MUST KEEP UPCOMING IN SEPTEMBER BEFORE ANYMORE REFILLS) 90 tablet 3   metFORMIN (GLUCOPHAGE) 1000 MG tablet Take 1,000 mg by mouth 2 (two) times daily.     Multiple Vitamin (MULTIVITAMIN WITH MINERALS) TABS tablet Take 1 tablet by mouth every morning. Men's One a Day     Polyvinyl Alcohol-Povidone (CLEAR EYES NATURAL TEARS OP) Place 1 drop into both eyes 2 (two) times daily as needed (dry eyes).     pregabalin (LYRICA) 200 MG capsule Take 200 mg by mouth 3 (three) times daily.     PRESCRIPTION MEDICATION Inhale into the lungs at bedtime. CPAP     rosuvastatin (CRESTOR) 5 MG tablet Take 1 tablet (5 mg total) by mouth daily at 6 PM. 90 tablet 3   XARELTO 20 MG TABS tablet TAKE 1 TABLET DAILY WITH SUPPER 90 tablet 1   No current facility-administered medications for this visit.    Allergies:   Patient has no known allergies.    Social History:  The patient  reports that he has never smoked. He has never used smokeless tobacco. He reports current alcohol use. He reports that he does not use drugs.   Family History:  The patient's family history includes AAA (abdominal aortic aneurysm) in his brother and mother;  Alzheimer's disease in his father; Cirrhosis in his brother; Hypertension in his brother and mother.    ROS:  Please see the history of present illness.   Otherwise, review of systems are positive for dietary indiscretions.   All other systems are reviewed and negative.    PHYSICAL EXAM: VS:  BP 104/60   Pulse 61   Ht 5\' 10"  (1.778 m)   Wt 228 lb 6.4 oz (103.6 kg)   SpO2 94%   BMI 32.77 kg/m  , BMI Body mass index is 32.77 kg/m. GEN: Well nourished, well developed, in no acute distress HEENT: normal Neck: no JVD, carotid bruits, or masses Cardiac: RRR; no murmurs, rubs, or gallops,no edema  Respiratory:  clear to auscultation bilaterally, normal work of breathing GI: soft, nontender, nondistended, + BS MS: no deformity or atrophy Skin: warm and dry, no rash Neuro:  Strength and sensation are intact Psych: euthymic mood, full affect   EKG:   The ekg ordered today demonstrates NSR, nonspecific ST changes   Recent Labs: 07/02/2020: BUN 14; Creatinine, Ser 1.12; Potassium 4.1; Sodium 140   Lipid Panel    Component Value Date/Time   CHOL 161 04/02/2016 0917   TRIG 179 (H) 04/02/2016 0917   HDL 36 (L) 04/02/2016 0917   CHOLHDL 4.5 04/02/2016 0917   VLDL 36 (H) 04/02/2016 0917   LDLCALC 89 04/02/2016 0917     Other studies Reviewed: Additional studies/ records that were reviewed today with results demonstrating: labs reviewed.   ASSESSMENT AND PLAN:  PAF: Noted in 8/16 by ILR after cryptogenic stroke.  Xarelto for stroke prevention. No bleeding problems.  Currently, it appears that benefits of Xarelto outweigh the risks.  Would continue the Xarelto for stroke prevention.  His neurologist asked him to inquire about this. Coronary calcification: 43rd percentile in 2021.  Continue lipid lowering therapy. No angina.  Regular exercise and healthy diet will also help reduce his risk going forward. Hypertensive heart disease: The current medical regimen is effective;  continue  present plan and medications. Hyperlipidemia: LDL 61, TG 122, HDL 47 in 12/2020. Continue Crestor.   Diabetes mellitus: A1C 7.6. On metformin.  He thinks something was added but cannot remember the name. Obesity: whole food, plant based diet. S/p lap band. Weight stable.  Exercises regularly.    Current medicines are reviewed at length with the patient today.  The patient concerns regarding his medicines were addressed.  The following changes have been made:  No change  Labs/ tests ordered today include:  No orders of the defined types were placed in this encounter.   Recommend 150 minutes/week of aerobic exercise Low fat, low carb, high fiber diet recommended  Disposition:   FU in 1 year   Signed, Larae Grooms, MD  01/30/2021 8:25 AM    [facility] [address], [place], [place]  [address] Phone: [phone]; Fax: [phone]

## 2021-01-30 ENCOUNTER — Encounter: Payer: Self-pay | Admitting: Interventional Cardiology

## 2021-01-30 ENCOUNTER — Other Ambulatory Visit: Payer: Self-pay

## 2021-01-30 ENCOUNTER — Ambulatory Visit (INDEPENDENT_AMBULATORY_CARE_PROVIDER_SITE_OTHER): Payer: Medicare Other | Admitting: Interventional Cardiology

## 2021-01-30 VITALS — BP 104/60 | HR 61 | Ht 70.0 in | Wt 228.4 lb

## 2021-01-30 DIAGNOSIS — E669 Obesity, unspecified: Secondary | ICD-10-CM | POA: Diagnosis not present

## 2021-01-30 DIAGNOSIS — I119 Hypertensive heart disease without heart failure: Secondary | ICD-10-CM

## 2021-01-30 DIAGNOSIS — I480 Paroxysmal atrial fibrillation: Secondary | ICD-10-CM

## 2021-01-30 DIAGNOSIS — E782 Mixed hyperlipidemia: Secondary | ICD-10-CM

## 2021-01-30 NOTE — Patient Instructions (Signed)
Medication Instructions:  Your physician recommends that you continue on your current medications as directed. Please refer to the Current Medication list given to you today.  *If you need a refill on your cardiac medications before your next appointment, please call your pharmacy*   Lab Work: none If you have labs (blood work) drawn today and your tests are completely normal, you will receive your results only by: [place] (if you have MyChart) OR A paper copy in the mail If you have any lab test that is abnormal or we need to change your treatment, we will call you to review the results.   Testing/Procedures: none   Follow-Up: At [facility], you and your health needs are our priority.  As part of our continuing mission to provide you with exceptional heart care, we have created designated Provider Care Teams.  These Care Teams include your primary Cardiologist (physician) and Advanced Practice Providers (APPs -  Physician Assistants and Nurse Practitioners) who all work together to provide you with the care you need, when you need it.  We recommend signing up for the patient portal called "MyChart".  Sign up information is provided on this After Visit Summary.  MyChart is used to connect with patients for Virtual Visits (Telemedicine).  Patients are able to view lab/test results, encounter notes, upcoming appointments, etc.  Non-urgent messages can be sent to your provider as well.   To learn more about what you can do with MyChart, go to [email or web address].    Your next appointment:   12 month(s)  The format for your next appointment:   In Person  Provider:   You may see Larae Grooms, MD or one of the following Advanced Practice Providers on your designated Care Team:   Melina Copa, PA-C Ermalinda Barrios, PA-C   Other Instructions Please send a message through my chart with the name and dose of the new diabetes medication you are taking  High-Fiber Eating  Plan Fiber, also called dietary fiber, is a type of carbohydrate. It is found foods such as fruits, vegetables, whole grains, and beans. A high-fiber diet can have many health benefits. Your health care provider may recommend a high-fiber diet to help: Prevent constipation. Fiber can make your bowel movements more regular. Lower your cholesterol. Relieve the following conditions: Inflammation of veins in the anus (hemorrhoids). Inflammation of specific areas of the digestive tract (uncomplicated diverticulosis). A problem of the large intestine, also called the colon, that sometimes causes pain and diarrhea (irritable bowel syndrome, or IBS). Prevent overeating as part of a weight-loss plan. Prevent heart disease, type 2 diabetes, and certain cancers. What are tips for following this plan? Reading food labels  Check the nutrition facts label on food products for the amount of dietary fiber. Choose foods that have 5 grams of fiber or more per serving. The goals for recommended daily fiber intake include: Men (age 15 or younger): 34-38 g. Men (over age 65): 28-34 g. Women (age 7 or younger): 25-28 g. Women (over age 52): 22-25 g. Your daily fiber goal is _____________ g. Shopping Choose whole fruits and vegetables instead of processed forms, such as apple juice or applesauce. Choose a wide variety of high-fiber foods such as avocados, lentils, oats, and kidney beans. Read the nutrition facts label of the foods you choose. Be aware of foods with added fiber. These foods often have high sugar and sodium amounts per serving. Cooking Use whole-grain flour for baking and cooking. Cook with brown rice instead  of white rice. Meal planning Start the day with a breakfast that is high in fiber, such as a cereal that contains 5 g of fiber or more per serving. Eat breads and cereals that are made with whole-grain flour instead of refined flour or white flour. Eat brown rice, bulgur wheat, or millet  instead of white rice. Use beans in place of meat in soups, salads, and pasta dishes. Be sure that half of the grains you eat each day are whole grains. General information You can get the recommended daily intake of dietary fiber by: Eating a variety of fruits, vegetables, grains, nuts, and beans. Taking a fiber supplement if you are not able to take in enough fiber in your diet. It is better to get fiber through food than from a supplement. Gradually increase how much fiber you consume. If you increase your intake of dietary fiber too quickly, you may have bloating, cramping, or gas. Drink plenty of water to help you digest fiber. Choose high-fiber snacks, such as berries, raw vegetables, nuts, and popcorn. What foods should I eat? Fruits Berries. Pears. Apples. Oranges. Avocado. Prunes and raisins. Dried figs. Vegetables Sweet potatoes. Spinach. Kale. Artichokes. Cabbage. Broccoli. Cauliflower. Green peas. Carrots. Squash. Grains Whole-grain breads. Multigrain cereal. Oats and oatmeal. Brown rice. Barley. Bulgur wheat. [place]. Quinoa. Bran muffins. Popcorn. Rye wafer crackers. Meats and other proteins Navy beans, kidney beans, and pinto beans. Soybeans. Split peas. Lentils. Nuts and seeds. Dairy Fiber-fortified yogurt. Beverages Fiber-fortified soy milk. Fiber-fortified orange juice. Other foods Fiber bars. The items listed above may not be a complete list of recommended foods and beverages. Contact a dietitian for more information. What foods should I avoid? Fruits Fruit juice. Cooked, strained fruit. Vegetables Fried potatoes. Canned vegetables. Well-cooked vegetables. Grains White bread. Pasta made with refined flour. White rice. Meats and other proteins Fatty cuts of meat. Fried chicken or fried fish. Dairy Milk. Yogurt. Cream cheese. Sour cream. Fats and oils Butters. Beverages Soft drinks. Other foods Cakes and pastries. The items listed above may not be a complete  list of foods and beverages to avoid. Talk with your dietitian about what choices are best for you. Summary Fiber is a type of carbohydrate. It is found in foods such as fruits, vegetables, whole grains, and beans. A high-fiber diet has many benefits. It can help to prevent constipation, lower blood cholesterol, aid weight loss, and reduce your risk of heart disease, diabetes, and certain cancers. Increase your intake of fiber gradually. Increasing fiber too quickly may cause cramping, bloating, and gas. Drink plenty of water while you increase the amount of fiber you consume. The best sources of fiber include whole fruits and vegetables, whole grains, nuts, seeds, and beans. This information is not intended to replace advice given to you by your health care provider. Make sure you discuss any questions you have with your health care provider. Document Revised: 08/18/2019 Document Reviewed: 08/18/2019 Elsevier Patient Education  2022 [employer].

## 2021-02-04 ENCOUNTER — Other Ambulatory Visit: Payer: Self-pay | Admitting: Interventional Cardiology

## 2021-02-05 ENCOUNTER — Other Ambulatory Visit: Payer: Self-pay

## 2021-02-05 ENCOUNTER — Ambulatory Visit
Admission: RE | Admit: 2021-02-05 | Discharge: 2021-02-05 | Disposition: A | Discharge status: Home / Self Care | Payer: Medicare Other | Source: Ambulatory Visit | Attending: General Surgery | Admitting: General Surgery

## 2021-02-05 DIAGNOSIS — K862 Cyst of pancreas: Secondary | ICD-10-CM | POA: Diagnosis not present

## 2021-02-05 MED ORDER — GADOBENATE DIMEGLUMINE 529 MG/ML IV SOLN
20.0000 mL | Freq: Once | INTRAVENOUS | Status: AC | PRN
  Administered 2021-02-05: 20 mL via INTRAVENOUS

## 2021-02-06 DIAGNOSIS — M533 Sacrococcygeal disorders, not elsewhere classified: Secondary | ICD-10-CM | POA: Diagnosis not present

## 2021-02-06 DIAGNOSIS — Z6832 Body mass index (BMI) 32.0-32.9, adult: Secondary | ICD-10-CM | POA: Diagnosis not present

## 2021-02-06 DIAGNOSIS — Z4889 Encounter for other specified surgical aftercare: Secondary | ICD-10-CM | POA: Diagnosis not present

## 2021-02-06 DIAGNOSIS — S62627D Displaced fracture of medial phalanx of left little finger, subsequent encounter for fracture with routine healing: Secondary | ICD-10-CM | POA: Diagnosis not present

## 2021-02-07 DIAGNOSIS — S62627D Displaced fracture of medial phalanx of left little finger, subsequent encounter for fracture with routine healing: Secondary | ICD-10-CM | POA: Diagnosis not present

## 2021-02-14 ENCOUNTER — Other Ambulatory Visit: Payer: Self-pay | Admitting: Interventional Cardiology

## 2021-02-14 NOTE — Telephone Encounter (Signed)
Prescription refill request for Xarelto received.   Indication: afib  Last office visit: [place], 01/30/2021 Weight: 103.6 kg  Age: 75 yo  Scr: 1.12, 07/02/2020 CrCl: 87 ml/min   Refill sent.

## 2021-02-18 DIAGNOSIS — Z23 Encounter for immunization: Secondary | ICD-10-CM | POA: Diagnosis not present

## 2021-02-21 ENCOUNTER — Other Ambulatory Visit: Payer: Self-pay | Admitting: Interventional Cardiology

## 2021-02-21 DIAGNOSIS — M533 Sacrococcygeal disorders, not elsewhere classified: Secondary | ICD-10-CM | POA: Diagnosis not present

## 2021-02-28 DIAGNOSIS — M5417 Radiculopathy, lumbosacral region: Secondary | ICD-10-CM | POA: Diagnosis not present

## 2021-02-28 DIAGNOSIS — G3184 Mild cognitive impairment, so stated: Secondary | ICD-10-CM | POA: Diagnosis not present

## 2021-02-28 DIAGNOSIS — M545 Low back pain, unspecified: Secondary | ICD-10-CM | POA: Diagnosis not present

## 2021-02-28 DIAGNOSIS — R208 Other disturbances of skin sensation: Secondary | ICD-10-CM | POA: Diagnosis not present

## 2021-04-09 DIAGNOSIS — H2513 Age-related nuclear cataract, bilateral: Secondary | ICD-10-CM | POA: Diagnosis not present

## 2021-04-09 DIAGNOSIS — H3563 Retinal hemorrhage, bilateral: Secondary | ICD-10-CM | POA: Diagnosis not present

## 2021-04-09 DIAGNOSIS — H25011 Cortical age-related cataract, right eye: Secondary | ICD-10-CM | POA: Diagnosis not present

## 2021-04-09 DIAGNOSIS — D3132 Benign neoplasm of left choroid: Secondary | ICD-10-CM | POA: Diagnosis not present

## 2021-04-11 DIAGNOSIS — I959 Hypotension, unspecified: Secondary | ICD-10-CM | POA: Diagnosis not present

## 2021-04-11 DIAGNOSIS — R5383 Other fatigue: Secondary | ICD-10-CM | POA: Diagnosis not present

## 2021-04-18 DIAGNOSIS — D649 Anemia, unspecified: Secondary | ICD-10-CM | POA: Diagnosis not present

## 2021-04-18 DIAGNOSIS — N179 Acute kidney failure, unspecified: Secondary | ICD-10-CM | POA: Diagnosis not present

## 2021-04-18 DIAGNOSIS — I951 Orthostatic hypotension: Secondary | ICD-10-CM | POA: Diagnosis not present

## 2021-05-14 DIAGNOSIS — J310 Chronic rhinitis: Secondary | ICD-10-CM | POA: Diagnosis not present

## 2021-05-14 DIAGNOSIS — J324 Chronic pansinusitis: Secondary | ICD-10-CM | POA: Diagnosis not present

## 2021-05-14 DIAGNOSIS — J342 Deviated nasal septum: Secondary | ICD-10-CM | POA: Diagnosis not present

## 2021-05-14 DIAGNOSIS — D649 Anemia, unspecified: Secondary | ICD-10-CM | POA: Diagnosis not present

## 2021-05-27 ENCOUNTER — Telehealth: Payer: Self-pay | Admitting: *Deleted

## 2021-05-27 DIAGNOSIS — R931 Abnormal findings on diagnostic imaging of heart and coronary circulation: Secondary | ICD-10-CM

## 2021-05-27 DIAGNOSIS — Z86010 Personal history of colonic polyps: Secondary | ICD-10-CM | POA: Diagnosis not present

## 2021-05-27 DIAGNOSIS — R238 Other skin changes: Secondary | ICD-10-CM | POA: Diagnosis not present

## 2021-05-27 DIAGNOSIS — R195 Other fecal abnormalities: Secondary | ICD-10-CM | POA: Diagnosis not present

## 2021-05-27 DIAGNOSIS — Z8673 Personal history of transient ischemic attack (TIA), and cerebral infarction without residual deficits: Secondary | ICD-10-CM | POA: Diagnosis not present

## 2021-05-27 DIAGNOSIS — D649 Anemia, unspecified: Secondary | ICD-10-CM | POA: Diagnosis not present

## 2021-05-27 DIAGNOSIS — I480 Paroxysmal atrial fibrillation: Secondary | ICD-10-CM | POA: Diagnosis not present

## 2021-05-27 DIAGNOSIS — Z9884 Bariatric surgery status: Secondary | ICD-10-CM | POA: Diagnosis not present

## 2021-05-27 NOTE — Telephone Encounter (Signed)
° °  Pre-operative Risk Assessment    Patient Name: Eddie Zimmerman  DOB: 13-Apr-1946 MRN: [id number]      Request for Surgical Clearance    Procedure:   COLONOSCOPY/ENDOSCOPY  Date of Surgery:  Clearance 07/25/21                                 Surgeon:  DR. Alessandra Bevels Surgeon's Group or Practice Name:  [facility] Phone number:  [phone] Fax number:  [phone]   Type of Clearance Requested:   - Medical  - Pharmacy:  Hold Rivaroxaban (Xarelto) x 3 DAYS PRIOR   Type of Anesthesia:   PROPOFOL   Additional requests/questions:    [name]   05/27/2021, 5:48 PM

## 2021-05-28 DIAGNOSIS — R931 Abnormal findings on diagnostic imaging of heart and coronary circulation: Secondary | ICD-10-CM | POA: Insufficient documentation

## 2021-05-28 DIAGNOSIS — I2510 Atherosclerotic heart disease of native coronary artery without angina pectoris: Secondary | ICD-10-CM | POA: Insufficient documentation

## 2021-05-28 NOTE — Telephone Encounter (Signed)
Not sure that there's differences in reported rates of bruising specifically between anticoagulants but Eliquis does carry an overall lower bleed risk compared to Xarelto. He'd qualify for Eliquis 5mg  BID if he's interested in switching with first dose to be taken 24 hours after his last dose of Xarelto. Agree any OTC use of aspirin and NSAIDs could worsen bruising as well.

## 2021-05-28 NOTE — Telephone Encounter (Signed)
Patient with diagnosis of afib on Xarelto for anticoagulation.    Procedure: colonoscopy/endoscopy Date of procedure: 07/25/21  CHA2DS2-VASc Score = 7  This indicates a 11.2% annual risk of stroke. The patient's score is based upon: CHF History: 1 HTN History: 1 Diabetes History: 1 Stroke History: 2 Vascular Disease History: 1 Age Score: 1 Gender Score: 0   CrCl 61mL/min using adjusted body weight Platelet count 213K  Pt is at elevated risk off of anticoag. Request is to hold Xarelto for 3 days prior to procedure. Recommend that pt hold for 1 day prior and resume as soon as safely possible after.

## 2021-05-28 NOTE — Telephone Encounter (Signed)
Contacted patient today as part of preoperative protocol.  He reports that he has been having increased bruising on his arms.  This appears to be in part caused by his 2 new puppies.  He is also having endoscopy and colonoscopy related to decreasing "blood counts".  Per patient report.  He is asking if he may be contacted about switching off of Xarelto to another DOAC agent.  I informed him that I would let you know.  Thank you for your help.  Jossie Ng. Casilda Pickerill NP-C    05/28/2021, 2:51 PM [facility] [address] Office [phone] Fax [phone]

## 2021-05-28 NOTE — Telephone Encounter (Signed)
° °  Primary Cardiologist: Larae Grooms, MD  Chart reviewed as part of pre-operative protocol coverage. Given past medical history and time since last visit, based on ACC/AHA guidelines, Eddie Zimmerman would be at acceptable risk for the planned procedure without further cardiovascular testing.   Patient with diagnosis of afib on Xarelto for anticoagulation.     Procedure: colonoscopy/endoscopy Date of procedure: 07/25/21   CHA2DS2-VASc Score = 7  This indicates a 11.2% annual risk of stroke. The patient's score is based upon: CHF History: 1 HTN History: 1 Diabetes History: 1 Stroke History: 2 Vascular Disease History: 1 Age Score: 1 Gender Score: 0   CrCl 13mL/min using adjusted body weight Platelet count 213K   Pt is at elevated risk off of anticoag. Request is to hold Xarelto for 3 days prior to procedure. Recommend that pt hold for 1 day prior and resume as soon as safely possible after.  Patient was advised that if he develops new symptoms prior to surgery to contact our office to arrange a follow-up appointment.  He verbalized understanding.  I will route this recommendation to the requesting party via Epic fax function and remove from pre-op pool.  Please call with questions.  Jossie Ng. Kiondra Caicedo NP-C    05/28/2021, 2:46 PM [place] [address] Office [phone] Fax [phone]

## 2021-05-30 NOTE — Telephone Encounter (Signed)
OK to switch to ELiquis per Megan's recs.

## 2021-05-31 MED ORDER — APIXABAN 5 MG PO TABS: 5.0000 mg | ORAL_TABLET | Freq: Two times a day (BID) | ORAL | 0 refills | Status: DC

## 2021-05-31 NOTE — Addendum Note (Signed)
Addended by: Johny Shock B on: 05/31/2021 02:15 PM   Modules accepted: Orders

## 2021-05-31 NOTE — Telephone Encounter (Signed)
Patient recently switched from Xarelto to Eliquis. Please comment on holding Eliquis prior to colonoscopy and route back to pre-op pool. Thanks

## 2021-05-31 NOTE — Telephone Encounter (Addendum)
Called and spoke to the pt about switching from Xarelto to Eliquis.   A full discussion regarding Eliquis was carried out. A benefit/risk analysis was presented to the patient.  The need for compliance stressed. Pt is aware to take the medication twice daily (about 12 hours apart).  Side effects of potential bleeding discussed, including unusual colored stools, coffee ground emesis, or serious fall or head trauma. The patient should avoid any OTC items containing aspirin or ibuprofen. Call for any questions. Gave pt the main number to heart care and to the anticoagulation clinic.   Pt stated that he has some Xarelto left but will stop taking it when his Eliquis comes in. Pt stated Xarelto is about 10 dollars for a 3 month supply [facility] and usually gets [facility]. Informed pt that I would send in Eliquis prescription to [facility]. Pt typically takes Xarelto with supper. Informed pt that when his Eliquis comes in pt should start and take his Eliquis that evening instead of Xarelto. Instructed pt to continue taking his Xarelto until his Eliquis comes in, then stop Xarelto. Pt verbalized understanding.

## 2021-05-31 NOTE — Telephone Encounter (Signed)
Lov: 01/30/2021, varanasi Scr: 1.12, 07/02/2020 Weight:103.6 kg  Age: 76 yo   Discussed with melissa pharm D. Will switch pt to Eliquis 5mg  BID. Attempted to call pt. Lmom to call clinic back.

## 2021-06-03 NOTE — Telephone Encounter (Signed)
Will provide same rec for Eliquis hold - Pt should hold for 24 hours prior to procedure and resume as soon as safely possible after due to elevated CV risk off of anticoag.

## 2021-06-04 NOTE — Telephone Encounter (Signed)
° °  Patient Name: Eddie Zimmerman  DOB: 06-13-1945 MRN: [id number]  Primary Cardiologist: Larae Grooms, MD  Chart reviewed as part of pre-operative protocol coverage. Pre-op clearance already addressed by colleagues in earlier phone notes. To summarize recommendations:  - Per Coletta Memos, NP, "Given past medical history and time since last visit, based on ACC/AHA guidelines, RENA SWEEDEN would be at acceptable risk for the planned procedure without further cardiovascular testing." The patient was advised that if he develops new symptoms prior to surgery to contact our office to arrange for a follow-up visit, and he verbalized understanding. Of note, recent Hgb 04/2021 by [facility] was normal, along with normal platelet count in 10/2020.  - Regarding anticoagulation, patient was recently switched by the team from Xarelto to Eliquis, to finish out his current rx then make the transition as outlined. Patient confirms that by the time of his procedure he will be on the Eliquis. Per pharmD, "Pt should hold for 24 hours prior to procedure and resume as soon as safely possible after due to elevated CV risk off of anticoag."  Will route this bundled recommendation to requesting provider via Epic fax function and remove from pre-op pool. Please call with questions.  Charlie Pitter, PA-C 06/04/2021, 8:24 AM

## 2021-06-18 DIAGNOSIS — D649 Anemia, unspecified: Secondary | ICD-10-CM | POA: Diagnosis not present

## 2021-06-18 DIAGNOSIS — I10 Essential (primary) hypertension: Secondary | ICD-10-CM | POA: Diagnosis not present

## 2021-06-26 DIAGNOSIS — Z79899 Other long term (current) drug therapy: Secondary | ICD-10-CM | POA: Diagnosis not present

## 2021-06-26 DIAGNOSIS — M150 Primary generalized (osteo)arthritis: Secondary | ICD-10-CM | POA: Diagnosis not present

## 2021-06-26 DIAGNOSIS — Z6832 Body mass index (BMI) 32.0-32.9, adult: Secondary | ICD-10-CM | POA: Diagnosis not present

## 2021-06-26 DIAGNOSIS — M1009 Idiopathic gout, multiple sites: Secondary | ICD-10-CM | POA: Diagnosis not present

## 2021-06-26 DIAGNOSIS — M0600 Rheumatoid arthritis without rheumatoid factor, unspecified site: Secondary | ICD-10-CM | POA: Diagnosis not present

## 2021-06-26 DIAGNOSIS — M25522 Pain in left elbow: Secondary | ICD-10-CM | POA: Diagnosis not present

## 2021-06-26 DIAGNOSIS — E669 Obesity, unspecified: Secondary | ICD-10-CM | POA: Diagnosis not present

## 2021-07-02 DIAGNOSIS — D649 Anemia, unspecified: Secondary | ICD-10-CM | POA: Diagnosis not present

## 2021-07-02 DIAGNOSIS — F330 Major depressive disorder, recurrent, mild: Secondary | ICD-10-CM | POA: Diagnosis not present

## 2021-07-02 DIAGNOSIS — E1140 Type 2 diabetes mellitus with diabetic neuropathy, unspecified: Secondary | ICD-10-CM | POA: Diagnosis not present

## 2021-07-02 DIAGNOSIS — E7800 Pure hypercholesterolemia, unspecified: Secondary | ICD-10-CM | POA: Diagnosis not present

## 2021-07-02 DIAGNOSIS — Z7984 Long term (current) use of oral hypoglycemic drugs: Secondary | ICD-10-CM | POA: Diagnosis not present

## 2021-07-02 DIAGNOSIS — Z8673 Personal history of transient ischemic attack (TIA), and cerebral infarction without residual deficits: Secondary | ICD-10-CM | POA: Diagnosis not present

## 2021-07-02 DIAGNOSIS — G4730 Sleep apnea, unspecified: Secondary | ICD-10-CM | POA: Diagnosis not present

## 2021-07-02 DIAGNOSIS — I10 Essential (primary) hypertension: Secondary | ICD-10-CM | POA: Diagnosis not present

## 2021-07-04 DIAGNOSIS — G603 Idiopathic progressive neuropathy: Secondary | ICD-10-CM | POA: Diagnosis not present

## 2021-07-04 DIAGNOSIS — M5417 Radiculopathy, lumbosacral region: Secondary | ICD-10-CM | POA: Diagnosis not present

## 2021-07-04 DIAGNOSIS — G3184 Mild cognitive impairment, so stated: Secondary | ICD-10-CM | POA: Diagnosis not present

## 2021-07-04 DIAGNOSIS — G5623 Lesion of ulnar nerve, bilateral upper limbs: Secondary | ICD-10-CM | POA: Diagnosis not present

## 2021-07-04 DIAGNOSIS — G5603 Carpal tunnel syndrome, bilateral upper limbs: Secondary | ICD-10-CM | POA: Diagnosis not present

## 2021-07-04 DIAGNOSIS — Z79899 Other long term (current) drug therapy: Secondary | ICD-10-CM | POA: Diagnosis not present

## 2021-07-18 DIAGNOSIS — H05243 Constant exophthalmos, bilateral: Secondary | ICD-10-CM | POA: Diagnosis not present

## 2021-07-18 DIAGNOSIS — H02831 Dermatochalasis of right upper eyelid: Secondary | ICD-10-CM | POA: Diagnosis not present

## 2021-07-18 DIAGNOSIS — H02413 Mechanical ptosis of bilateral eyelids: Secondary | ICD-10-CM | POA: Diagnosis not present

## 2021-07-18 DIAGNOSIS — D487 Neoplasm of uncertain behavior of other specified sites: Secondary | ICD-10-CM | POA: Diagnosis not present

## 2021-07-18 DIAGNOSIS — H02834 Dermatochalasis of left upper eyelid: Secondary | ICD-10-CM | POA: Diagnosis not present

## 2021-07-19 DIAGNOSIS — H3563 Retinal hemorrhage, bilateral: Secondary | ICD-10-CM | POA: Diagnosis not present

## 2021-07-19 DIAGNOSIS — H2513 Age-related nuclear cataract, bilateral: Secondary | ICD-10-CM | POA: Diagnosis not present

## 2021-07-19 DIAGNOSIS — E113293 Type 2 diabetes mellitus with mild nonproliferative diabetic retinopathy without macular edema, bilateral: Secondary | ICD-10-CM | POA: Diagnosis not present

## 2021-07-19 DIAGNOSIS — H25011 Cortical age-related cataract, right eye: Secondary | ICD-10-CM | POA: Diagnosis not present

## 2021-07-25 DIAGNOSIS — K2970 Gastritis, unspecified, without bleeding: Secondary | ICD-10-CM | POA: Diagnosis not present

## 2021-07-25 DIAGNOSIS — D122 Benign neoplasm of ascending colon: Secondary | ICD-10-CM | POA: Diagnosis not present

## 2021-07-25 DIAGNOSIS — K648 Other hemorrhoids: Secondary | ICD-10-CM | POA: Diagnosis not present

## 2021-07-25 DIAGNOSIS — D509 Iron deficiency anemia, unspecified: Secondary | ICD-10-CM | POA: Diagnosis not present

## 2021-07-25 DIAGNOSIS — K2930 Chronic superficial gastritis without bleeding: Secondary | ICD-10-CM | POA: Diagnosis not present

## 2021-07-25 DIAGNOSIS — K621 Rectal polyp: Secondary | ICD-10-CM | POA: Diagnosis not present

## 2021-07-25 DIAGNOSIS — K2100 Gastro-esophageal reflux disease with esophagitis, without bleeding: Secondary | ICD-10-CM | POA: Diagnosis not present

## 2021-07-25 DIAGNOSIS — D123 Benign neoplasm of transverse colon: Secondary | ICD-10-CM | POA: Diagnosis not present

## 2021-07-25 DIAGNOSIS — K5730 Diverticulosis of large intestine without perforation or abscess without bleeding: Secondary | ICD-10-CM | POA: Diagnosis not present

## 2021-07-25 DIAGNOSIS — K219 Gastro-esophageal reflux disease without esophagitis: Secondary | ICD-10-CM | POA: Diagnosis not present

## 2021-07-25 DIAGNOSIS — B9681 Helicobacter pylori [H. pylori] as the cause of diseases classified elsewhere: Secondary | ICD-10-CM | POA: Diagnosis not present

## 2021-07-25 DIAGNOSIS — R195 Other fecal abnormalities: Secondary | ICD-10-CM | POA: Diagnosis not present

## 2021-07-31 DIAGNOSIS — M79675 Pain in left toe(s): Secondary | ICD-10-CM | POA: Diagnosis not present

## 2021-08-01 ENCOUNTER — Other Ambulatory Visit: Payer: Self-pay | Admitting: Physician Assistant

## 2021-08-01 DIAGNOSIS — H05243 Constant exophthalmos, bilateral: Secondary | ICD-10-CM

## 2021-08-01 DIAGNOSIS — S62627D Displaced fracture of medial phalanx of left little finger, subsequent encounter for fracture with routine healing: Secondary | ICD-10-CM | POA: Diagnosis not present

## 2021-08-06 DIAGNOSIS — B9681 Helicobacter pylori [H. pylori] as the cause of diseases classified elsewhere: Secondary | ICD-10-CM | POA: Diagnosis not present

## 2021-08-06 DIAGNOSIS — D123 Benign neoplasm of transverse colon: Secondary | ICD-10-CM | POA: Diagnosis not present

## 2021-08-06 DIAGNOSIS — K621 Rectal polyp: Secondary | ICD-10-CM | POA: Diagnosis not present

## 2021-08-06 DIAGNOSIS — K2930 Chronic superficial gastritis without bleeding: Secondary | ICD-10-CM | POA: Diagnosis not present

## 2021-08-06 DIAGNOSIS — K219 Gastro-esophageal reflux disease without esophagitis: Secondary | ICD-10-CM | POA: Diagnosis not present

## 2021-08-06 DIAGNOSIS — D122 Benign neoplasm of ascending colon: Secondary | ICD-10-CM | POA: Diagnosis not present

## 2021-08-12 ENCOUNTER — Other Ambulatory Visit: Payer: Self-pay | Admitting: Interventional Cardiology

## 2021-08-12 NOTE — Telephone Encounter (Signed)
Prescription refill request for Eliquis received. ?Indication: Atrial Fib ?Last office visit: 01/30/21  Lendell Caprice MD ?Scr: 1.39 on 07/02/21 ?Age: 76 ?Weight: 103.6kg ? ?Based on above findings Eliquis '5mg'$  twice daily is the appropriate dose.  Refill approved. ? ?

## 2021-08-13 ENCOUNTER — Ambulatory Visit
Admission: RE | Admit: 2021-08-13 | Discharge: 2021-08-13 | Disposition: A | Discharge status: Home / Self Care | Payer: Medicare Other | Source: Ambulatory Visit | Attending: Physician Assistant | Admitting: Physician Assistant

## 2021-08-13 DIAGNOSIS — H05243 Constant exophthalmos, bilateral: Secondary | ICD-10-CM | POA: Diagnosis not present

## 2021-08-13 DIAGNOSIS — J3489 Other specified disorders of nose and nasal sinuses: Secondary | ICD-10-CM | POA: Diagnosis not present

## 2021-08-13 DIAGNOSIS — J320 Chronic maxillary sinusitis: Secondary | ICD-10-CM | POA: Diagnosis not present

## 2021-08-13 DIAGNOSIS — M79672 Pain in left foot: Secondary | ICD-10-CM | POA: Diagnosis not present

## 2021-08-19 DIAGNOSIS — M21622 Bunionette of left foot: Secondary | ICD-10-CM | POA: Diagnosis not present

## 2021-08-21 ENCOUNTER — Telehealth: Payer: Self-pay | Admitting: *Deleted

## 2021-08-21 NOTE — Telephone Encounter (Signed)
Patient with diagnosis of atrial fibrillation on Eliquis for anticoagulation.   ? ?Procedure: LEFT FOOT FIFTH METATARSAL OSTEOTOMY WITH BUNIONETTE EXCISION, FLEXOR DIGITORUM LONGUS TENOLYSIS ?Date of procedure: 09/05/21 ? ? ?CHA2DS2-VASc Score = 7  ? This indicates a 11.2% annual risk of stroke. ?The patient's score is based upon: ?CHF History: 1 ?HTN History: 1 ?Diabetes History: 1 ?Stroke History: 2 ?Vascular Disease History: 1 ?Age Score: 1 ?Gender Score: 0 ?  ?Per chart cryptogenic stroke with ILR placed 06/2014 ?CrCl 58 ?Platelet count 261 ? ?Per office protocol, patient can hold Eliquis for 1 days prior to procedure.   ?Patient will not need bridging with Lovenox (enoxaparin) around procedure. ? ?For orthopedic procedures please be sure to resume therapeutic (not prophylactic) dosing. ? ?

## 2021-08-21 NOTE — Telephone Encounter (Signed)
? ?  Pre-operative Risk Assessment  ?  ?Patient Name: Eddie Zimmerman  ?DOB: May 07, 1945 ?MRN: [id number]  ? ?  ? ?Request for Surgical Clearance   ? ?Procedure:   LEFT FOOT FIFTH METATARSAL OSTEOTOMY WITH BUNIONETTE EXCISION, FLEXOR DIGITORUM LONGUS TENOLYSIS ? ?Date of Surgery:  Clearance 09/05/21                              ?   ?Surgeon:  Kathleen Argue ORTHOPEDIC ?Surgeon's Group or Practice Name:  DR. Radene Journey ?Phone number:  [phone] ?Fax number:  [phone] ATTN: Lattie Corns ?  ?Type of Clearance Requested:   ?- Medical  ?- Pharmacy:  Hold Apixaban (Eliquis)   ?  ?Type of Anesthesia:   CHOICE ?  ?Additional requests/questions:   ? ?Signed, ?Julaine Hua   ?08/21/2021, 2:04 PM  ? ?

## 2021-08-22 NOTE — Telephone Encounter (Signed)
? ? ?  Name: Eddie Zimmerman  ?DOB: 1945/08/26  ?MRN: [id number] ? ?Primary Cardiologist: Larae Grooms, MD ? ? ?Preoperative team, please contact this patient and set up a phone call appointment for further preoperative risk assessment. Please obtain consent and complete medication review. Thank you for your help. ? ?I confirm that guidance regarding antiplatelet and oral anticoagulation therapy has been completed and, if necessary, noted below. ? ? ? ?Christell Faith, PA-C ?08/22/2021, 8:18 AM ?[facility] ?[address] ?[place], [place] [address] ? ? ?

## 2021-08-22 NOTE — Telephone Encounter (Signed)
Left message for the pt to call back for tele pre op appt.  ?

## 2021-08-26 NOTE — Telephone Encounter (Signed)
I s/w the pt today and he tells me that he is postponing his surgery until the Fall. I assured the pt that I will send my notes to Dr. Lucia Gaskins today as well per our conversation. At this point we will remove from our pre op pools. Once the pt is ready to have his surgery the surgeon's office will need to send over a new clearance request.  ? ?

## 2021-09-24 DIAGNOSIS — H02413 Mechanical ptosis of bilateral eyelids: Secondary | ICD-10-CM | POA: Diagnosis not present

## 2021-09-24 DIAGNOSIS — H5069 Other mechanical strabismus: Secondary | ICD-10-CM | POA: Diagnosis not present

## 2021-09-24 DIAGNOSIS — H02831 Dermatochalasis of right upper eyelid: Secondary | ICD-10-CM | POA: Diagnosis not present

## 2021-09-24 DIAGNOSIS — H02834 Dermatochalasis of left upper eyelid: Secondary | ICD-10-CM | POA: Diagnosis not present

## 2021-09-24 DIAGNOSIS — D487 Neoplasm of uncertain behavior of other specified sites: Secondary | ICD-10-CM | POA: Diagnosis not present

## 2021-09-24 DIAGNOSIS — H57813 Brow ptosis, bilateral: Secondary | ICD-10-CM | POA: Diagnosis not present

## 2021-09-24 DIAGNOSIS — H05221 Edema of right orbit: Secondary | ICD-10-CM | POA: Diagnosis not present

## 2021-09-26 ENCOUNTER — Telehealth: Payer: Self-pay | Admitting: *Deleted

## 2021-09-26 NOTE — Telephone Encounter (Signed)
Clinical pharmacist to review Eliquis 

## 2021-09-26 NOTE — Telephone Encounter (Signed)
   Pre-operative Risk Assessment    Patient Name: Eddie Zimmerman  DOB: 1945/12/17 MRN: [id number]      Request for Surgical Clearance    Procedure:   RIGHT ANTERIOR ORBITOTOMY WITH THE RELEASE OF SCAR TISSUE  Date of Surgery:  Clearance TBD                                 Surgeon:  DR. Isidoro Donning Surgeon's Group or Practice Name:  [facility]  Phone number:  [phone] Fax number:  [phone]   Type of Clearance Requested:   - Medical  - Pharmacy:  Hold Rivaroxaban (Xarelto)     Type of Anesthesia:  MAC   Additional requests/questions:    [name]   09/26/2021, 9:37 AM

## 2021-09-26 NOTE — Telephone Encounter (Signed)
   Name: Eddie Zimmerman  DOB: 02/29/1946  MRN: [id number]  Primary Cardiologist: Larae Grooms, MD  Chart reviewed as part of pre-operative protocol coverage. Because of [name]'s past medical history and time since last visit, he will require a follow-up in-office visit in order to better assess preoperative cardiovascular risk.  Pre-op covering staff: - Please schedule appointment and call patient to inform them. If patient already had an upcoming appointment within acceptable timeframe, please add "pre-op clearance" to the appointment notes so provider is aware. - Please contact requesting surgeon's office via preferred method (i.e, phone, fax) to inform them of need for appointment prior to surgery.  [place], [place]  09/26/2021, 11:49 PM

## 2021-09-27 NOTE — Telephone Encounter (Signed)
Pt has been scheduled to see Richardson Dopp, PA, 10/08/21, clearance will be addressed at that time.  Will route back to the requesting surgeon's office to make them aware.

## 2021-09-27 NOTE — Telephone Encounter (Signed)
Patient with diagnosis of Afib on Eliquis for anticoagulation.    Procedure: RIGHT ANTERIOR ORBITOTOMY Date of procedure: TBD   CHA2DS2-VASc Score = 7  his indicates a 11.2% annual risk of stroke. The patient's score is based upon:   CrCl 58.3 using AdjBW Platelet count 261K  Patient was transitioned from Xarelto to Eliquis for anticoagulation. Per office protocol, patient can hold Eliquis for 1 day prior to procedure. Please resume Eliquis as soon as safely possible after the procedure.

## 2021-10-07 NOTE — Progress Notes (Addendum)
Cardiology Office Note:    Date:  10/08/2021   ID:  Eddie Zimmerman, DOB 06/21/1945, MRN [id number]  PCP:  [name], MD  [facility] Providers Cardiologist:  Larae Grooms, MD    Referring MD: [name], MD   Chief Complaint:  Surgical Clearance    Patient Profile: Paroxysmal atrial fibrillation Hx cryptogenic stroke in 2016 S/p ILR >> removed in 2018 Coronary artery calcification by CT scan CAC score 12/2019: 107 (43rd percentile) Diabetes mellitus Hypertension Hyperlipidemia Gout Sleep apnea Obesity s/p lap band surgery   Prior CV Studies: CAC score 01/12/2020  CAC score 107 (43rd percentile) Ascending aorta 37 mm  Transesophageal echocardiogram 06/14/2014 EF 55-60, no RWMA, AV sclerosis, mild to moderate AI, mild MR  Carotid US 06/19/2014 Bilateral ICA 1-39  Echocardiogram 06/08/2014 Moderate LVH, EF 55-60, mild LAE, PASP 33  History of Present Illness:   Eddie Zimmerman is a 76 y.o. male with the above problem list.  He was last seen by Dr. Irish Lack in October 2022.  He returns for surgical clearance.  He needs right eye surgery under conscious sedation with Dr. Lorina Rabon.  It has been requested to hold his anticoagulation.   Our Pharm.D. team has reviewed his history and has recommended holding his medication for 1 day prior to his procedure.  He is here alone.  He is doing well without chest discomfort, shortness of breath, syncope, orthopnea, leg edema.  He walks 3 times a week and goes to the gym 3 times a week.  He has had no significant limitations in his activity.    Past Medical History:  Diagnosis Date   Acute ischemic left MCA stroke ([place]) 08/21/2014   Aphasia    Arthritis    hands, wrist & Knee    Chronic diastolic heart failure ([place]) 06/08/2014   Cryptogenic stroke ([place]) 06/29/2014   CVA (cerebral vascular accident) ([place]) 06/14/2014   Depression    Diabetes ([place]) 08/21/2014   Diabetes mellitus without complication ([place])    Dysrhythmia    ?  Atrial fibrillation episode" Loop Recorder" implanted left chest- [place] follows   Essential hypertension, benign 04/07/2013   Gout    Hearing impaired person, bilateral    hearing aids used   History of kidney stones LAST MAY 2015   HLD (hyperlipidemia) 08/21/2014   Hx of laparoscopic gastric banding    remains in place   Hypertension    Hypertensive heart disease 03/17/2016   Hypertrophic obstructive cardiomyopathy ([place]) 04/07/2013   Obstructive sleep apnea 04/07/2013   PAF (paroxysmal atrial fibrillation) ([place]) 06/14/2015   Peripheral neuropathy    Sleep apnea    cpap use, SETTING OF 13   Slurred speech 06/08/2014   Stroke ([place]) 06-07-2014   Current Medications: Current Meds  Medication Sig   allopurinol (ZYLOPRIM) 100 MG tablet Take 100 mg by mouth every morning.   allopurinol (ZYLOPRIM) 300 MG tablet Take 300 mg by mouth every morning.   amoxicillin (AMOXIL) 500 MG capsule Take 2,000 mg by mouth See admin instructions. Take 4 capsules (2000 mg) by mouth one hour prior to dental procedure   Coenzyme Q10 (COQ-10) 200 MG CAPS Take 200 mg by mouth every morning.   Cyanocobalamin (VITAMIN B-12 PO) Take 1 tablet by mouth every morning.   empagliflozin (JARDIANCE) 10 MG TABS tablet Take 10 mg by mouth daily.   escitalopram (LEXAPRO) 20 MG tablet Take 20 mg by mouth every morning.   hydrochlorothiazide (MICROZIDE) 12.5 MG capsule Take 12.5 mg by  mouth every morning.    hydroxychloroquine (PLAQUENIL) 200 MG tablet Take 200 mg by mouth 2 (two) times daily.   lisinopril (ZESTRIL) 10 MG tablet Take 5 mg by mouth daily.   meloxicam (MOBIC) 15 MG tablet Take 15 mg by mouth as needed for pain.   metFORMIN (GLUCOPHAGE) 1000 MG tablet Take 1,000 mg by mouth 2 (two) times daily.   Multiple Vitamin (MULTIVITAMIN WITH MINERALS) TABS tablet Take 1 tablet by mouth every morning. Men's One a Day   pantoprazole (PROTONIX) 40 MG tablet Take 40 mg by mouth daily.   Polyvinyl Alcohol-Povidone (CLEAR  EYES NATURAL TEARS OP) Place 1 drop into both eyes 2 (two) times daily as needed (dry eyes).   pregabalin (LYRICA) 200 MG capsule Take 200 mg by mouth daily.   PRESCRIPTION MEDICATION Inhale into the lungs at bedtime. CPAP   rivaroxaban (XARELTO) 20 MG TABS tablet daily.   rosuvastatin (CRESTOR) 5 MG tablet TAKE 1 TABLET DAILY AT 6 P.M.   traMADol (ULTRAM) 50 MG tablet Take 50 mg by mouth 3 (three) times daily as needed for pain.   tretinoin (RETIN-A) 0.1 % cream as needed.    Allergies:   Patient has no known allergies.   Social History   Tobacco Use   Smoking status: Never   Smokeless tobacco: Never  Vaping Use   Vaping Use: Never used  Substance Use Topics   Alcohol use: Yes    Alcohol/week: 0.0 standard drinks of alcohol    Comment: rare 1 PER month   Drug use: No    Family Hx: The patient's family history includes AAA (abdominal aortic aneurysm) in his brother and mother; Alzheimer's disease in his father; Cirrhosis in his brother; Hypertension in his brother and mother. There is no history of Heart attack.  Review of Systems  Gastrointestinal:  Negative for hematochezia and melena.  Genitourinary:  Negative for hematuria.     EKGs/Labs/Other Test Reviewed:    EKG:  EKG is  ordered today.  The ekg ordered today demonstrates sinus bradycardia, HR 57, low voltage, left axis deviation, nonspecific ST-T wave changes, QTc 424, no change from prior tracing  Labs obtained through [facility] - personally reviewed and interpreted: 07/02/2021: Total cholesterol 132, HDL 42, LDL 67, triglycerides 132, A1c 6.1, Hgb 13.8, creatinine 1.36, K+ 4.3, ALT 18   Risk Assessment/Calculations/Metrics:    CHA2DS2-VASc Score = 7   This indicates a 11.2% annual risk of stroke. The patient's score is based upon: CHF History: 1 HTN History: 1 Diabetes History: 1 Stroke History: 2 Vascular Disease History: 1 Age Score: 1 Gender Score: 0             Physical Exam:    VS:  BP 114/70    Pulse (!) 57   Ht '5\' 10"'$  (1.778 m)   Wt 215 lb (97.5 kg)   SpO2 94%   BMI 30.85 kg/m     Wt Readings from Last 3 Encounters:  10/08/21 215 lb (97.5 kg)  01/30/21 228 lb 6.4 oz (103.6 kg)  07/03/20 232 lb 5.8 oz (105.4 kg)    Constitutional:      Appearance: Healthy appearance. Not in distress.  Neck:     Vascular: JVD normal.  Pulmonary:     Effort: Pulmonary effort is normal.     Breath sounds: No wheezing. No rales.  Cardiovascular:     Normal rate. Regular rhythm. Normal S1. Normal S2.      Murmurs: There is no murmur.  Edema:    Peripheral edema absent.  Abdominal:     Palpations: Abdomen is soft.  Skin:    General: Skin is warm and dry.  Neurological:     General: No focal deficit present.     Mental Status: Alert and oriented to person, place and time.         ASSESSMENT & PLAN:   Preoperative cardiovascular examination Mr. Hanratty's perioperative risk of a major cardiac event is 0.9% according to the Revised Cardiac Risk Index (RCRI).  Therefore, he is at low risk for perioperative complications.   His functional capacity is good at 5.81 METs according to the Duke Activity Status Index (DASI). Recommendations: According to ACC/AHA guidelines, no further cardiovascular testing needed.  The patient may proceed to surgery at acceptable risk.   Antiplatelet and/or Anticoagulation Recommendations: Xarelto (Rivaroxaban) can be held for 1 day prior to surgery.  Please resume post op when felt to be safe.    PAF (paroxysmal atrial fibrillation) (HCC) Maintaining sinus rhythm.  He is tolerating anticoagulation.  Creatinine clearance is 67 cc/min.  Continue rivaroxaban 20 mg daily.  Hypertension BP is well controlled.  Continue HCTZ 12.5 mg daily, lisinopril 5 mg daily.  Coronary artery calcification seen on CT scan He is not having any anginal symptoms.  He is not on antiplatelet therapy as he is on Rivaroxaban.  Continue rosuvastatin 5 mg daily.  Most recent LDL  optimal.           Dispo:  Return in about 1 year (around 10/09/2022) for Routine follow up in 1 year with Dr. Irish Lack.   Medication Adjustments/Labs and Tests Ordered: Current medicines are reviewed at length with the patient today.  Concerns regarding medicines are outlined above.  Tests Ordered: Orders Placed This Encounter  Procedures   EKG 12-Lead   Medication Changes: No orders of the defined types were placed in this encounter.  Signed, Richardson Dopp, PA-C  10/08/2021 9:59 AM    [facility] [place], [place], [place]  [address] Phone: [phone]; Fax: [phone]

## 2021-10-08 ENCOUNTER — Ambulatory Visit (INDEPENDENT_AMBULATORY_CARE_PROVIDER_SITE_OTHER): Payer: Medicare Other | Admitting: Physician Assistant

## 2021-10-08 ENCOUNTER — Encounter: Payer: Self-pay | Admitting: Physician Assistant

## 2021-10-08 VITALS — BP 114/70 | HR 57 | Ht 70.0 in | Wt 215.0 lb

## 2021-10-08 DIAGNOSIS — I480 Paroxysmal atrial fibrillation: Secondary | ICD-10-CM

## 2021-10-08 DIAGNOSIS — Z01810 Encounter for preprocedural cardiovascular examination: Secondary | ICD-10-CM

## 2021-10-08 DIAGNOSIS — I10 Essential (primary) hypertension: Secondary | ICD-10-CM

## 2021-10-08 DIAGNOSIS — I2510 Atherosclerotic heart disease of native coronary artery without angina pectoris: Secondary | ICD-10-CM | POA: Diagnosis not present

## 2021-10-08 NOTE — Patient Instructions (Signed)
Medication Instructions:   Your physician recommends that you continue on your current medications as directed. Please refer to the Current Medication list given to you today.   *If you need a refill on your cardiac medications before your next appointment, please call your pharmacy*   Lab Work:  None ordered.  If you have labs (blood work) drawn today and your tests are completely normal, you will receive your results only by: [place] (if you have MyChart) OR A paper copy in the mail If you have any lab test that is abnormal or we need to change your treatment, we will call you to review the results.   Testing/Procedures:  None ordered.   Follow-Up: At [facility], you and your health needs are our priority.  As part of our continuing mission to provide you with exceptional heart care, we have created designated Provider Care Teams.  These Care Teams include your primary Cardiologist (physician) and Advanced Practice Providers (APPs -  Physician Assistants and Nurse Practitioners) who all work together to provide you with the care you need, when you need it.  We recommend signing up for the patient portal called "MyChart".  Sign up information is provided on this After Visit Summary.  MyChart is used to connect with patients for Virtual Visits (Telemedicine).  Patients are able to view lab/test results, encounter notes, upcoming appointments, etc.  Non-urgent messages can be sent to your provider as well.   To learn more about what you can do with MyChart, go to [email or web address].    Your next appointment:   12 year(s)  The format for your next appointment:   In Person  Provider:   Larae Grooms, MD     Other Instructions  Your physician wants you to follow-up in: 1 year with Dr.Varanasi.  You will receive a reminder letter in the mail two months in advance. If you don't receive a letter, please call our office to schedule the follow-up  appointment.   Important Information About Sugar

## 2021-10-08 NOTE — Assessment & Plan Note (Signed)
BP is well controlled.  Continue HCTZ 12.5 mg daily, lisinopril 5 mg daily.

## 2021-10-08 NOTE — Assessment & Plan Note (Signed)
He is not having any anginal symptoms.  He is not on antiplatelet therapy as he is on Rivaroxaban.  Continue rosuvastatin 5 mg daily.  Most recent LDL optimal.

## 2021-10-08 NOTE — Telephone Encounter (Signed)
Notes faxed to surgeon. Richardson Dopp, PA-C  10/08/2021 10:01 AM

## 2021-10-08 NOTE — Assessment & Plan Note (Signed)
Maintaining sinus rhythm.  He is tolerating anticoagulation.  Creatinine clearance is 67 cc/min.  Continue rivaroxaban 20 mg daily.

## 2021-10-08 NOTE — Assessment & Plan Note (Addendum)
[name]'s perioperative risk of a major cardiac event is 0.9% according to the Revised Cardiac Risk Index (RCRI).  Therefore, he is at low risk for perioperative complications.   His functional capacity is good at 5.81 METs according to the Duke Activity Status Index (DASI). Recommendations: According to ACC/AHA guidelines, no further cardiovascular testing needed.  The patient may proceed to surgery at acceptable risk.   Antiplatelet and/or Anticoagulation Recommendations: Xarelto (Rivaroxaban) can be held for 1 day prior to surgery.  Please resume post op when felt to be safe.

## 2021-11-04 ENCOUNTER — Other Ambulatory Visit: Payer: Self-pay | Admitting: Ophthalmology

## 2021-11-04 DIAGNOSIS — H02413 Mechanical ptosis of bilateral eyelids: Secondary | ICD-10-CM | POA: Diagnosis not present

## 2021-11-04 DIAGNOSIS — H57813 Brow ptosis, bilateral: Secondary | ICD-10-CM | POA: Diagnosis not present

## 2021-11-04 DIAGNOSIS — H5069 Other mechanical strabismus: Secondary | ICD-10-CM | POA: Diagnosis not present

## 2021-11-04 DIAGNOSIS — D1779 Benign lipomatous neoplasm of other sites: Secondary | ICD-10-CM | POA: Diagnosis not present

## 2021-11-04 DIAGNOSIS — H02834 Dermatochalasis of left upper eyelid: Secondary | ICD-10-CM | POA: Diagnosis not present

## 2021-11-04 DIAGNOSIS — D487 Neoplasm of uncertain behavior of other specified sites: Secondary | ICD-10-CM | POA: Diagnosis not present

## 2021-11-04 DIAGNOSIS — H05221 Edema of right orbit: Secondary | ICD-10-CM | POA: Diagnosis not present

## 2021-11-04 DIAGNOSIS — H02831 Dermatochalasis of right upper eyelid: Secondary | ICD-10-CM | POA: Diagnosis not present

## 2021-11-04 DIAGNOSIS — D170 Benign lipomatous neoplasm of skin and subcutaneous tissue of head, face and neck: Secondary | ICD-10-CM | POA: Diagnosis not present

## 2021-11-04 DIAGNOSIS — H5060 Mechanical strabismus, unspecified: Secondary | ICD-10-CM | POA: Diagnosis not present

## 2021-11-19 DIAGNOSIS — D509 Iron deficiency anemia, unspecified: Secondary | ICD-10-CM | POA: Diagnosis not present

## 2021-11-19 DIAGNOSIS — K2970 Gastritis, unspecified, without bleeding: Secondary | ICD-10-CM | POA: Diagnosis not present

## 2021-11-19 DIAGNOSIS — N182 Chronic kidney disease, stage 2 (mild): Secondary | ICD-10-CM | POA: Diagnosis not present

## 2021-11-19 DIAGNOSIS — Z8673 Personal history of transient ischemic attack (TIA), and cerebral infarction without residual deficits: Secondary | ICD-10-CM | POA: Diagnosis not present

## 2021-11-19 DIAGNOSIS — Z86010 Personal history of colonic polyps: Secondary | ICD-10-CM | POA: Diagnosis not present

## 2021-11-19 DIAGNOSIS — K219 Gastro-esophageal reflux disease without esophagitis: Secondary | ICD-10-CM | POA: Diagnosis not present

## 2021-11-19 DIAGNOSIS — Z9884 Bariatric surgery status: Secondary | ICD-10-CM | POA: Diagnosis not present

## 2021-11-21 DIAGNOSIS — G603 Idiopathic progressive neuropathy: Secondary | ICD-10-CM | POA: Diagnosis not present

## 2021-11-21 DIAGNOSIS — Z79899 Other long term (current) drug therapy: Secondary | ICD-10-CM | POA: Diagnosis not present

## 2021-11-21 DIAGNOSIS — M545 Low back pain, unspecified: Secondary | ICD-10-CM | POA: Diagnosis not present

## 2021-11-21 DIAGNOSIS — R208 Other disturbances of skin sensation: Secondary | ICD-10-CM | POA: Diagnosis not present

## 2021-11-27 DIAGNOSIS — J324 Chronic pansinusitis: Secondary | ICD-10-CM | POA: Diagnosis not present

## 2021-11-27 DIAGNOSIS — J310 Chronic rhinitis: Secondary | ICD-10-CM | POA: Diagnosis not present

## 2021-11-27 DIAGNOSIS — J342 Deviated nasal septum: Secondary | ICD-10-CM | POA: Diagnosis not present

## 2021-12-13 DIAGNOSIS — N200 Calculus of kidney: Secondary | ICD-10-CM | POA: Diagnosis not present

## 2021-12-24 ENCOUNTER — Other Ambulatory Visit: Payer: Self-pay | Admitting: *Deleted

## 2021-12-24 DIAGNOSIS — I480 Paroxysmal atrial fibrillation: Secondary | ICD-10-CM

## 2021-12-24 MED ORDER — RIVAROXABAN 20 MG PO TABS: 20.0000 mg | ORAL_TABLET | Freq: Every day | ORAL | 1 refills | Status: DC

## 2021-12-24 NOTE — Telephone Encounter (Signed)
Xarelto '20mg'$  refill request received. Pt is 76 years old, weight-97.5kg, Crea-1.18 on 06/26/2021 via [employer], last seen by Richardson Dopp on 10/08/2021, Diagnosis-Afib, CrCl-76.61m/min; Dose is appropriate based on dosing criteria. Will send in refill to requested pharmacy.

## 2022-01-15 DIAGNOSIS — Z8673 Personal history of transient ischemic attack (TIA), and cerebral infarction without residual deficits: Secondary | ICD-10-CM | POA: Diagnosis not present

## 2022-01-15 DIAGNOSIS — I480 Paroxysmal atrial fibrillation: Secondary | ICD-10-CM | POA: Diagnosis not present

## 2022-01-15 DIAGNOSIS — E1140 Type 2 diabetes mellitus with diabetic neuropathy, unspecified: Secondary | ICD-10-CM | POA: Diagnosis not present

## 2022-01-15 DIAGNOSIS — Z23 Encounter for immunization: Secondary | ICD-10-CM | POA: Diagnosis not present

## 2022-01-15 DIAGNOSIS — G4730 Sleep apnea, unspecified: Secondary | ICD-10-CM | POA: Diagnosis not present

## 2022-01-15 DIAGNOSIS — Z0000 Encounter for general adult medical examination without abnormal findings: Secondary | ICD-10-CM | POA: Diagnosis not present

## 2022-01-15 DIAGNOSIS — Z125 Encounter for screening for malignant neoplasm of prostate: Secondary | ICD-10-CM | POA: Diagnosis not present

## 2022-01-15 DIAGNOSIS — E7800 Pure hypercholesterolemia, unspecified: Secondary | ICD-10-CM | POA: Diagnosis not present

## 2022-01-16 DIAGNOSIS — Z6830 Body mass index (BMI) 30.0-30.9, adult: Secondary | ICD-10-CM | POA: Diagnosis not present

## 2022-01-16 DIAGNOSIS — E669 Obesity, unspecified: Secondary | ICD-10-CM | POA: Diagnosis not present

## 2022-01-16 DIAGNOSIS — M1009 Idiopathic gout, multiple sites: Secondary | ICD-10-CM | POA: Diagnosis not present

## 2022-01-16 DIAGNOSIS — M1991 Primary osteoarthritis, unspecified site: Secondary | ICD-10-CM | POA: Diagnosis not present

## 2022-01-16 DIAGNOSIS — Z79899 Other long term (current) drug therapy: Secondary | ICD-10-CM | POA: Diagnosis not present

## 2022-01-16 DIAGNOSIS — M0600 Rheumatoid arthritis without rheumatoid factor, unspecified site: Secondary | ICD-10-CM | POA: Diagnosis not present

## 2022-01-20 DIAGNOSIS — E119 Type 2 diabetes mellitus without complications: Secondary | ICD-10-CM | POA: Diagnosis not present

## 2022-01-20 DIAGNOSIS — D3132 Benign neoplasm of left choroid: Secondary | ICD-10-CM | POA: Diagnosis not present

## 2022-01-20 DIAGNOSIS — H2513 Age-related nuclear cataract, bilateral: Secondary | ICD-10-CM | POA: Diagnosis not present

## 2022-01-20 DIAGNOSIS — H25011 Cortical age-related cataract, right eye: Secondary | ICD-10-CM | POA: Diagnosis not present

## 2022-01-22 DIAGNOSIS — L570 Actinic keratosis: Secondary | ICD-10-CM | POA: Diagnosis not present

## 2022-01-22 DIAGNOSIS — X32XXXD Exposure to sunlight, subsequent encounter: Secondary | ICD-10-CM | POA: Diagnosis not present

## 2022-01-22 DIAGNOSIS — L821 Other seborrheic keratosis: Secondary | ICD-10-CM | POA: Diagnosis not present

## 2022-01-22 DIAGNOSIS — L820 Inflamed seborrheic keratosis: Secondary | ICD-10-CM | POA: Diagnosis not present

## 2022-01-22 DIAGNOSIS — Z1283 Encounter for screening for malignant neoplasm of skin: Secondary | ICD-10-CM | POA: Diagnosis not present

## 2022-01-22 DIAGNOSIS — D225 Melanocytic nevi of trunk: Secondary | ICD-10-CM | POA: Diagnosis not present

## 2022-02-17 DIAGNOSIS — M533 Sacrococcygeal disorders, not elsewhere classified: Secondary | ICD-10-CM | POA: Diagnosis not present

## 2022-03-11 ENCOUNTER — Other Ambulatory Visit: Payer: Self-pay | Admitting: Interventional Cardiology

## 2022-04-03 ENCOUNTER — Telehealth: Payer: Self-pay

## 2022-04-03 ENCOUNTER — Other Ambulatory Visit: Payer: Self-pay

## 2022-04-03 ENCOUNTER — Emergency Department (HOSPITAL_COMMUNITY): Payer: Medicare Other

## 2022-04-03 ENCOUNTER — Emergency Department (HOSPITAL_COMMUNITY)
Admission: EM | Admit: 2022-04-03 | Discharge: 2022-04-04 | Discharge status: Against Medical Advice | Payer: Medicare Other | Attending: Emergency Medicine | Admitting: Emergency Medicine

## 2022-04-03 DIAGNOSIS — R079 Chest pain, unspecified: Secondary | ICD-10-CM | POA: Diagnosis not present

## 2022-04-03 DIAGNOSIS — Z5321 Procedure and treatment not carried out due to patient leaving prior to being seen by health care provider: Secondary | ICD-10-CM | POA: Insufficient documentation

## 2022-04-03 DIAGNOSIS — Z7901 Long term (current) use of anticoagulants: Secondary | ICD-10-CM | POA: Insufficient documentation

## 2022-04-03 DIAGNOSIS — J9811 Atelectasis: Secondary | ICD-10-CM | POA: Diagnosis not present

## 2022-04-03 DIAGNOSIS — R0789 Other chest pain: Secondary | ICD-10-CM | POA: Diagnosis not present

## 2022-04-03 LAB — TROPONIN I (HIGH SENSITIVITY): Troponin I (High Sensitivity): 12 ng/L (ref <18)

## 2022-04-03 LAB — CBC
HCT: 43.3 % (ref 39.0–52.0)
Hemoglobin: 14.1 g/dL (ref 13.0–17.0)
MCH: 31 pg (ref 26.0–34.0)
MCHC: 32.6 g/dL (ref 30.0–36.0)
MCV: 95.2 fL (ref 80.0–100.0)
Platelets: 223 10*3/uL (ref 150–400)
RBC: 4.55 MIL/uL (ref 4.22–5.81)
RDW: 15.2 % (ref 11.5–15.5)
WBC: 6.8 10*3/uL (ref 4.0–10.5)
nRBC: 0 % (ref 0.0–0.2)

## 2022-04-03 LAB — BASIC METABOLIC PANEL
Anion gap: 8 (ref 5–15)
BUN: 16 mg/dL (ref 8–23)
CO2: 25 mmol/L (ref 22–32)
Calcium: 9.4 mg/dL (ref 8.9–10.3)
Chloride: 107 mmol/L (ref 98–111)
Creatinine, Ser: 1.19 mg/dL (ref 0.61–1.24)
GFR, Estimated: >60 mL/min (ref >60)
Glucose, Bld: 88 mg/dL (ref 70–99)
Potassium: 4.5 mmol/L (ref 3.5–5.1)
Sodium: 140 mmol/L (ref 135–145)

## 2022-04-03 NOTE — ED Provider Triage Note (Signed)
Emergency Medicine Provider Triage Evaluation Note  Eddie Zimmerman , a 76 y.o. male  was evaluated in triage.  Pt complains of left-sided chest pain onset 4 days.  Patient notes that he had a mechanical fall due to tripping over a rock 1 week ago.  Notes that he landed in a awkward push-up position and thinks that he may have strained his chest wall.  Patient does take Xarelto without any missed doses.  Went to his cardiologist office and was told to come here for evaluation.  Denies this chest bothering him prior to the fall.  Does not have a past medical history of MI, cardiac catheterization, stents.  Denies shortness of breath, abdominal pain, nausea, vomiting, hitting his head, LOC, neck pain, back pain.  Review of Systems  Positive:  Negative:   Physical Exam  BP 136/83 (BP Location: Right Arm)   Pulse (!) 55   Temp 97.9 F (36.6 C) (Oral)   Resp 16   SpO2 97%  Gen:   Awake, no distress   Resp:  Normal effort MSK:   Moves extremities without difficulty  Other:  No spinal tenderness to palpation.  Tenderness to palpation noted to left outer chest wall without overlying skin changes.  Medical Decision Making  Medically screening exam initiated at 9:22 AM.  Appropriate orders placed.  Cy Blamer was informed that the remainder of the evaluation will be completed by another provider, this initial triage assessment does not replace that evaluation, and the importance of remaining in the ED until their evaluation is complete.    Tahesha Skeet A, PA-C 04/03/22 [phone]

## 2022-04-03 NOTE — Telephone Encounter (Signed)
Pt walked in to office complaining of active chest pain.  Pt reports CP started about  a week ago.  He states discomfort is in the center of his chest and radiates out toward the left side of his chest.  Denies SOB or dizziness at this time.  Pt advised with active CP he needs to be further evaluated in the ED now.  Offered to call EMS for pt.  He states his wife is with him and he will have her drive him to [facility] ED now.  Pt verbalizes understanding and will follow up as directed after discharge.

## 2022-04-03 NOTE — ED Triage Notes (Addendum)
Pt a week ago fell and having chest pain for about 4 days ago and takes blood thinner due to 2 previous strokes. Pt stated, it only hurts when Im doing something.

## 2022-04-03 NOTE — ED Notes (Signed)
Pt stated he was leaving. 

## 2022-06-23 ENCOUNTER — Other Ambulatory Visit: Payer: Self-pay | Admitting: Interventional Cardiology

## 2022-06-23 DIAGNOSIS — I480 Paroxysmal atrial fibrillation: Secondary | ICD-10-CM

## 2022-06-23 NOTE — Telephone Encounter (Signed)
Prescription refill request for Xarelto received.  Indication: Afib  Last office visit: 10/08/21 Kathlen Mody)  Weight: 95.3kg Age: 77 Scr: 1.19 (04/03/22)  CrCl: 73.57m/min  Appropriate dose. Refill sent.

## 2022-10-15 DIAGNOSIS — R339 Retention of urine, unspecified: Secondary | ICD-10-CM | POA: Diagnosis not present

## 2022-11-06 DIAGNOSIS — G603 Idiopathic progressive neuropathy: Secondary | ICD-10-CM | POA: Diagnosis not present

## 2022-11-06 DIAGNOSIS — M5417 Radiculopathy, lumbosacral region: Secondary | ICD-10-CM | POA: Diagnosis not present

## 2022-11-14 DIAGNOSIS — R339 Retention of urine, unspecified: Secondary | ICD-10-CM | POA: Diagnosis not present

## 2022-11-18 ENCOUNTER — Other Ambulatory Visit: Payer: Self-pay | Admitting: Specialist

## 2022-11-18 ENCOUNTER — Other Ambulatory Visit: Payer: Self-pay | Admitting: Interventional Cardiology

## 2022-11-18 DIAGNOSIS — G3184 Mild cognitive impairment, so stated: Secondary | ICD-10-CM

## 2022-11-22 ENCOUNTER — Ambulatory Visit
Admission: RE | Admit: 2022-11-22 | Discharge: 2022-11-22 | Disposition: A | Discharge status: Home / Self Care | Payer: TRICARE For Life (TFL) | Source: Ambulatory Visit | Attending: Specialist | Admitting: Specialist

## 2022-11-22 DIAGNOSIS — I6389 Other cerebral infarction: Secondary | ICD-10-CM | POA: Diagnosis not present

## 2022-11-22 DIAGNOSIS — G3184 Mild cognitive impairment, so stated: Secondary | ICD-10-CM | POA: Diagnosis not present

## 2022-11-22 DIAGNOSIS — G319 Degenerative disease of nervous system, unspecified: Secondary | ICD-10-CM | POA: Diagnosis not present

## 2022-11-22 DIAGNOSIS — J320 Chronic maxillary sinusitis: Secondary | ICD-10-CM | POA: Diagnosis not present

## 2022-12-04 DIAGNOSIS — G309 Alzheimer's disease, unspecified: Secondary | ICD-10-CM | POA: Diagnosis not present

## 2022-12-04 DIAGNOSIS — Z760 Encounter for issue of repeat prescription: Secondary | ICD-10-CM | POA: Diagnosis not present

## 2022-12-04 DIAGNOSIS — Z8673 Personal history of transient ischemic attack (TIA), and cerebral infarction without residual deficits: Secondary | ICD-10-CM | POA: Diagnosis not present

## 2022-12-04 DIAGNOSIS — E669 Obesity, unspecified: Secondary | ICD-10-CM | POA: Diagnosis not present

## 2022-12-04 DIAGNOSIS — G3184 Mild cognitive impairment, so stated: Secondary | ICD-10-CM | POA: Diagnosis not present

## 2022-12-04 DIAGNOSIS — G300 Alzheimer's disease with early onset: Secondary | ICD-10-CM | POA: Diagnosis not present

## 2022-12-04 DIAGNOSIS — M5417 Radiculopathy, lumbosacral region: Secondary | ICD-10-CM | POA: Diagnosis not present

## 2022-12-04 DIAGNOSIS — G5603 Carpal tunnel syndrome, bilateral upper limbs: Secondary | ICD-10-CM | POA: Diagnosis not present

## 2022-12-10 ENCOUNTER — Other Ambulatory Visit: Payer: Self-pay | Admitting: Interventional Cardiology

## 2022-12-15 DIAGNOSIS — E119 Type 2 diabetes mellitus without complications: Secondary | ICD-10-CM | POA: Diagnosis not present

## 2022-12-15 DIAGNOSIS — H25813 Combined forms of age-related cataract, bilateral: Secondary | ICD-10-CM | POA: Diagnosis not present

## 2022-12-15 DIAGNOSIS — D23111 Other benign neoplasm of skin of right upper eyelid, including canthus: Secondary | ICD-10-CM | POA: Diagnosis not present

## 2022-12-19 DIAGNOSIS — N200 Calculus of kidney: Secondary | ICD-10-CM | POA: Diagnosis not present

## 2022-12-19 DIAGNOSIS — R8279 Other abnormal findings on microbiological examination of urine: Secondary | ICD-10-CM | POA: Diagnosis not present

## 2023-01-01 DIAGNOSIS — Z760 Encounter for issue of repeat prescription: Secondary | ICD-10-CM | POA: Diagnosis not present

## 2023-01-01 DIAGNOSIS — E669 Obesity, unspecified: Secondary | ICD-10-CM | POA: Diagnosis not present

## 2023-01-01 DIAGNOSIS — Z8673 Personal history of transient ischemic attack (TIA), and cerebral infarction without residual deficits: Secondary | ICD-10-CM | POA: Diagnosis not present

## 2023-01-01 DIAGNOSIS — G300 Alzheimer's disease with early onset: Secondary | ICD-10-CM | POA: Diagnosis not present

## 2023-01-01 DIAGNOSIS — M5417 Radiculopathy, lumbosacral region: Secondary | ICD-10-CM | POA: Diagnosis not present

## 2023-01-06 ENCOUNTER — Other Ambulatory Visit: Payer: Self-pay | Admitting: General Surgery

## 2023-01-06 DIAGNOSIS — K862 Cyst of pancreas: Secondary | ICD-10-CM

## 2023-01-07 ENCOUNTER — Other Ambulatory Visit: Payer: Self-pay | Admitting: Interventional Cardiology

## 2023-01-09 DIAGNOSIS — R339 Retention of urine, unspecified: Secondary | ICD-10-CM | POA: Diagnosis not present

## 2023-01-15 DIAGNOSIS — M0600 Rheumatoid arthritis without rheumatoid factor, unspecified site: Secondary | ICD-10-CM | POA: Diagnosis not present

## 2023-01-15 DIAGNOSIS — M1991 Primary osteoarthritis, unspecified site: Secondary | ICD-10-CM | POA: Diagnosis not present

## 2023-01-15 DIAGNOSIS — M1009 Idiopathic gout, multiple sites: Secondary | ICD-10-CM | POA: Diagnosis not present

## 2023-01-15 DIAGNOSIS — Z79899 Other long term (current) drug therapy: Secondary | ICD-10-CM | POA: Diagnosis not present

## 2023-01-15 DIAGNOSIS — E669 Obesity, unspecified: Secondary | ICD-10-CM | POA: Diagnosis not present

## 2023-01-15 DIAGNOSIS — Z6830 Body mass index (BMI) 30.0-30.9, adult: Secondary | ICD-10-CM | POA: Diagnosis not present

## 2023-01-19 DIAGNOSIS — F324 Major depressive disorder, single episode, in partial remission: Secondary | ICD-10-CM | POA: Diagnosis not present

## 2023-01-19 DIAGNOSIS — I480 Paroxysmal atrial fibrillation: Secondary | ICD-10-CM | POA: Diagnosis not present

## 2023-01-19 DIAGNOSIS — Z8673 Personal history of transient ischemic attack (TIA), and cerebral infarction without residual deficits: Secondary | ICD-10-CM | POA: Diagnosis not present

## 2023-01-19 DIAGNOSIS — G4730 Sleep apnea, unspecified: Secondary | ICD-10-CM | POA: Diagnosis not present

## 2023-01-19 DIAGNOSIS — E1169 Type 2 diabetes mellitus with other specified complication: Secondary | ICD-10-CM | POA: Diagnosis not present

## 2023-01-19 DIAGNOSIS — D6869 Other thrombophilia: Secondary | ICD-10-CM | POA: Diagnosis not present

## 2023-01-19 DIAGNOSIS — E119 Type 2 diabetes mellitus without complications: Secondary | ICD-10-CM | POA: Diagnosis not present

## 2023-01-19 DIAGNOSIS — I10 Essential (primary) hypertension: Secondary | ICD-10-CM | POA: Diagnosis not present

## 2023-01-19 DIAGNOSIS — M0600 Rheumatoid arthritis without rheumatoid factor, unspecified site: Secondary | ICD-10-CM | POA: Diagnosis not present

## 2023-01-19 DIAGNOSIS — Z0000 Encounter for general adult medical examination without abnormal findings: Secondary | ICD-10-CM | POA: Diagnosis not present

## 2023-01-19 DIAGNOSIS — E7800 Pure hypercholesterolemia, unspecified: Secondary | ICD-10-CM | POA: Diagnosis not present

## 2023-02-07 ENCOUNTER — Other Ambulatory Visit: Payer: Medicare PPO

## 2023-02-08 ENCOUNTER — Ambulatory Visit
Admission: RE | Admit: 2023-02-08 | Discharge: 2023-02-08 | Disposition: A | Discharge status: Home / Self Care | Payer: Medicare PPO | Source: Ambulatory Visit | Attending: General Surgery | Admitting: General Surgery

## 2023-02-08 DIAGNOSIS — K8020 Calculus of gallbladder without cholecystitis without obstruction: Secondary | ICD-10-CM | POA: Diagnosis not present

## 2023-02-08 DIAGNOSIS — K862 Cyst of pancreas: Secondary | ICD-10-CM | POA: Diagnosis not present

## 2023-02-08 MED ORDER — GADOPICLENOL 0.5 MMOL/ML IV SOLN
9.5000 mL | Freq: Once | INTRAVENOUS | Status: AC | PRN
  Administered 2023-02-08: 9.5 mL via INTRAVENOUS

## 2023-02-12 DIAGNOSIS — M5417 Radiculopathy, lumbosacral region: Secondary | ICD-10-CM | POA: Diagnosis not present

## 2023-02-12 DIAGNOSIS — G300 Alzheimer's disease with early onset: Secondary | ICD-10-CM | POA: Diagnosis not present

## 2023-02-12 DIAGNOSIS — Z8673 Personal history of transient ischemic attack (TIA), and cerebral infarction without residual deficits: Secondary | ICD-10-CM | POA: Diagnosis not present

## 2023-02-12 DIAGNOSIS — G5603 Carpal tunnel syndrome, bilateral upper limbs: Secondary | ICD-10-CM | POA: Diagnosis not present

## 2023-02-12 DIAGNOSIS — E669 Obesity, unspecified: Secondary | ICD-10-CM | POA: Diagnosis not present

## 2023-02-27 ENCOUNTER — Other Ambulatory Visit: Payer: Self-pay | Admitting: Interventional Cardiology

## 2023-03-06 DIAGNOSIS — R339 Retention of urine, unspecified: Secondary | ICD-10-CM | POA: Diagnosis not present

## 2023-03-12 DIAGNOSIS — S20219A Contusion of unspecified front wall of thorax, initial encounter: Secondary | ICD-10-CM | POA: Diagnosis not present

## 2023-04-06 DIAGNOSIS — R339 Retention of urine, unspecified: Secondary | ICD-10-CM | POA: Diagnosis not present

## 2023-05-14 DIAGNOSIS — Z8673 Personal history of transient ischemic attack (TIA), and cerebral infarction without residual deficits: Secondary | ICD-10-CM | POA: Diagnosis not present

## 2023-05-14 DIAGNOSIS — G5603 Carpal tunnel syndrome, bilateral upper limbs: Secondary | ICD-10-CM | POA: Diagnosis not present

## 2023-05-14 DIAGNOSIS — G301 Alzheimer's disease with late onset: Secondary | ICD-10-CM | POA: Diagnosis not present

## 2023-05-14 DIAGNOSIS — M5417 Radiculopathy, lumbosacral region: Secondary | ICD-10-CM | POA: Diagnosis not present

## 2023-05-14 DIAGNOSIS — E669 Obesity, unspecified: Secondary | ICD-10-CM | POA: Diagnosis not present

## 2023-05-22 DIAGNOSIS — L820 Inflamed seborrheic keratosis: Secondary | ICD-10-CM | POA: Diagnosis not present

## 2023-05-22 DIAGNOSIS — D485 Neoplasm of uncertain behavior of skin: Secondary | ICD-10-CM | POA: Diagnosis not present

## 2023-05-22 DIAGNOSIS — X32XXXD Exposure to sunlight, subsequent encounter: Secondary | ICD-10-CM | POA: Diagnosis not present

## 2023-05-22 DIAGNOSIS — Z1283 Encounter for screening for malignant neoplasm of skin: Secondary | ICD-10-CM | POA: Diagnosis not present

## 2023-05-22 DIAGNOSIS — D225 Melanocytic nevi of trunk: Secondary | ICD-10-CM | POA: Diagnosis not present

## 2023-05-22 DIAGNOSIS — L570 Actinic keratosis: Secondary | ICD-10-CM | POA: Diagnosis not present

## 2023-06-02 ENCOUNTER — Telehealth: Payer: Self-pay | Admitting: Physician Assistant

## 2023-06-02 DIAGNOSIS — Z860101 Personal history of adenomatous and serrated colon polyps: Secondary | ICD-10-CM | POA: Diagnosis not present

## 2023-06-02 DIAGNOSIS — Z8673 Personal history of transient ischemic attack (TIA), and cerebral infarction without residual deficits: Secondary | ICD-10-CM | POA: Diagnosis not present

## 2023-06-02 DIAGNOSIS — E611 Iron deficiency: Secondary | ICD-10-CM | POA: Diagnosis not present

## 2023-06-02 DIAGNOSIS — I480 Paroxysmal atrial fibrillation: Secondary | ICD-10-CM | POA: Diagnosis not present

## 2023-06-02 NOTE — Telephone Encounter (Signed)
   Pre-operative Risk Assessment    Patient Name: Eddie Zimmerman  DOB: 30-Mar-1946 MRN: [id number]   Date of last office visit: 10/08/2021  Date of next office visit: none   Request for Surgical Clearance    Procedure:   colonoscopy  Date of Surgery:  Clearance 07/27/23                                Surgeon:  Dr. Elicia Surgeon's Group or Practice Name:  [facility] Phone number:  [phone] Fax number:  [phone]   Type of Clearance Requested:   - Medical  - Pharmacy:  Hold Rivaroxaban  (Xarelto ) Hold two days   Type of Anesthesia:   propofol    Additional requests/questions:    Bonney Jonel LITTIE Smitty   06/02/2023, 9:20 AM

## 2023-06-02 NOTE — Telephone Encounter (Signed)
 Patient with diagnosis of Afib on Eliquis  for anticoagulation.    Procedure: colonoscopy  Date of procedure: 07/27/23   CHA2DS2-VASc Score = 8   This indicates a 10.8% annual risk of stroke. The patient's score is based upon: CHF History: 1 HTN History: 1 Diabetes History: 1 Stroke History: 2 Vascular Disease History: 1 Age Score: 2 Gender Score: 0   CrCl 72 mL/min Platelet count 223 K    Per office protocol, patient can hold Xarelto  for 2 days prior to procedure.     **This guidance is not considered finalized until pre-operative APP has relayed final recommendations.**

## 2023-06-02 NOTE — Telephone Encounter (Signed)
    Primary Cardiologist:Jayadeep Dann, MD  Chart reviewed as part of pre-operative protocol coverage. Because of Isaak Delmundo Merriott's past medical history and time since last visit, he/she will require a follow-up visit in order to better assess preoperative cardiovascular risk.  Pre-op covering staff: - Please schedule in person office appointment and call patient to inform them. - Please contact requesting surgeon's office via preferred method (i.e, phone, fax) to inform them of need for appointment prior to surgery.  If applicable, this message will also be routed to pharmacy pool and/or primary cardiologist for input on holding anticoagulant/antiplatelet agent as requested below so that this information is available at time of patient's appointment.   Josefa CHRISTELLA Beauvais, NP  06/02/2023, 4:11 PM

## 2023-06-02 NOTE — Telephone Encounter (Signed)
Left message to call back to schedule in office appt for pre op clearance.

## 2023-06-03 NOTE — Telephone Encounter (Signed)
2nd attempt to reach the pt to schedule in office appt for preop clearance.

## 2023-06-04 NOTE — Telephone Encounter (Signed)
 Patient has scheduled an in office appointment for preop clearance for 06/25/23. Will route to requesting surgeons office to make them aware and remove from preop pool.

## 2023-06-11 DIAGNOSIS — M5417 Radiculopathy, lumbosacral region: Secondary | ICD-10-CM | POA: Diagnosis not present

## 2023-06-11 DIAGNOSIS — E669 Obesity, unspecified: Secondary | ICD-10-CM | POA: Diagnosis not present

## 2023-06-11 DIAGNOSIS — G301 Alzheimer's disease with late onset: Secondary | ICD-10-CM | POA: Diagnosis not present

## 2023-06-11 DIAGNOSIS — G5603 Carpal tunnel syndrome, bilateral upper limbs: Secondary | ICD-10-CM | POA: Diagnosis not present

## 2023-06-11 DIAGNOSIS — Z8673 Personal history of transient ischemic attack (TIA), and cerebral infarction without residual deficits: Secondary | ICD-10-CM | POA: Diagnosis not present

## 2023-06-11 DIAGNOSIS — G603 Idiopathic progressive neuropathy: Secondary | ICD-10-CM | POA: Diagnosis not present

## 2023-06-17 ENCOUNTER — Other Ambulatory Visit: Payer: Self-pay | Admitting: Interventional Cardiology

## 2023-06-17 DIAGNOSIS — I480 Paroxysmal atrial fibrillation: Secondary | ICD-10-CM

## 2023-06-17 NOTE — Telephone Encounter (Signed)
Prescription refill request for Xarelto received.  Indication:afib Last office visit:upcoming Weight:95.3  kg Age:78 Scr:1.54  9/24 CrCl:55.87  ml/min  Prescription refilled

## 2023-06-19 DIAGNOSIS — R339 Retention of urine, unspecified: Secondary | ICD-10-CM | POA: Diagnosis not present

## 2023-06-24 NOTE — Progress Notes (Unsigned)
 Cardiology Office Note    Date:  06/25/2023  ID:  Eddie Zimmerman, DOB February 28, 1946, MRN [id number] PCP:  Renford Dills, MD  Cardiologist:  Lance Muss, MD  Electrophysiologist:  None   Chief Complaint: Preoperative cardiac evaluation   History of Present Illness: .    Eddie Zimmerman is a 78 y.o. male with visit-pertinent history of paroxysmal atrial fibrillation, history of cryptogenic stroke in 2016, s/p ILR that was removed in 2018, coronary artery calcification by CT scan, CAC 107, 43rd percentile in 12/2019, diabetes mellitus, hypertension, hyperlipidemia, gout, sleep apnea, obesity s/p lap band surgery.  Eddie Zimmerman was last seen in clinic on 10/08/2021 by Tereso Newcomer, PA for preoperative cardiac clearance.  Patient was doing well at that time, he remained stable from cardiac standpoint.  He was going to the gym 3 times a week and had no limitations in next activity.  Today he presents for preoperative cardiac evaluation for colonoscopy.  He reports that he is doing very well, he denies chest pain, shortness of breath, palpitations, lower extremity edema, orthopnea or PND.  He denies any lightheadedness, dizziness, presyncope or syncope.  He continues going to the gym 3 to 4 days a week and exercising for at least an hour and a half, he notes that he tolerates this very well.    Labwork independently reviewed: 01/15/2023: Hemoglobin 13.6, hematocrit 41.7, sodium 146, creatinine 1.54, potassium 4.6, AST 29, ALT 14 ROS: .   Today he denies chest pain, shortness of breath, lower extremity edema, fatigue, palpitations, melena, hematuria, hemoptysis, diaphoresis, weakness, presyncope, syncope, orthopnea, and PND.  All other systems are reviewed and otherwise negative. Studies Reviewed: [name]    EKG:  EKG is ordered today, personally reviewed, demonstrating  EKG Interpretation Date/Time:  Thursday June 25 2023 08:14:43 EST Ventricular Rate:  57 PR Interval:  230 QRS Duration:  96 QT  Interval:  440 QTC Calculation: 428 R Axis:   -40  Text Interpretation: Sinus bradycardia with 1st degree A-V block Left axis deviation Low voltage QRS Cannot rule out Anterior infarct (cited on or before 03-Apr-2022) When compared with ECG of 03-Apr-2022 08:48, PR interval has increased Confirmed by Reather Littler [id number]) on 06/25/2023 8:23:13 AM   CV Studies:  Cardiac Studies & Procedures   ______________________________________________________________________________________________   STRESS TESTS  NM MYOCAR MULTI W/SPECT W 01/09/2009        CT SCANS  CT CARDIAC SCORING (SELF PAY ONLY) 01/12/2020  Addendum 01/12/2020 12:38 PM ADDENDUM REPORT: 01/12/2020 12:36  CLINICAL DATA:  Risk stratification  EXAM: Coronary Calcium Score  TECHNIQUE: The patient was scanned on a Siemens Somatom 64 slice scanner. Axial non-contrast 3 mm slices were carried out through the heart. The data set was analyzed on a dedicated work station and scored using the Agatson method.  FINDINGS: Non-cardiac: See separate report from [facility].  Ascending aorta: Upper normal diameter 3.7 cm  Pericardium: Normal  Coronary arteries: Calcium noted throughout the LAD as well as proximal and mid RCA  IMPRESSION: Coronary calcium score of 107. This was 34 rd percentile for age and sex matched control.  Eddie Zimmerman   Electronically Signed By: Eddie Zimmerman M.D. On: 01/12/2020 12:36  Narrative EXAM: OVER-READ INTERPRETATION  CT CHEST  The following report is an over-read performed by radiologist Dr. Charlett Zimmerman of [facility] on 01/12/2020. This over-read does not include interpretation of cardiac or coronary anatomy or pathology. The coronary calcium score interpretation by the cardiologist is attached.  COMPARISON:  None.  FINDINGS: Vascular: Heart is normal size. Aorta normal caliber. Calcifications in the aortic valve and aortic root.  Mediastinum/Nodes: No  adenopathy.  Lungs/Pleura: Platelike atelectasis in the right middle lobe. No confluent opacities otherwise. No effusions.  Upper Abdomen: Imaging into the upper abdomen demonstrates no acute findings.  Musculoskeletal: Chest wall soft tissues are unremarkable. No acute bony abnormality.  IMPRESSION: Area of right middle lobe atelectasis along the minor fissure.  Electronically Signed: By: Eddie Zimmerman M.D. On: 01/12/2020 09:17     ______________________________________________________________________________________________       Current Reported Medications:.    Current Meds  Medication Sig   allopurinol (ZYLOPRIM) 100 MG tablet Take 100 mg by mouth every morning.   allopurinol (ZYLOPRIM) 300 MG tablet Take 300 mg by mouth every morning.   amoxicillin (AMOXIL) 500 MG capsule Take 2,000 mg by mouth See admin instructions. Take 4 capsules (2000 mg) by mouth one hour prior to dental procedure   Cyanocobalamin (VITAMIN B-12 PO) Take 1 tablet by mouth every morning.   empagliflozin (JARDIANCE) 10 MG TABS tablet Take 10 mg by mouth daily.   escitalopram (LEXAPRO) 20 MG tablet Take 20 mg by mouth every morning.   hydroxychloroquine (PLAQUENIL) 200 MG tablet Take 200 mg by mouth 2 (two) times daily.   lisinopril (ZESTRIL) 10 MG tablet Take 5 mg by mouth daily.   metFORMIN (GLUCOPHAGE) 1000 MG tablet Take 1,000 mg by mouth 2 (two) times daily.   Multiple Vitamin (MULTIVITAMIN WITH MINERALS) TABS tablet Take 1 tablet by mouth every morning. Men's One a Day   OZEMPIC, 1 MG/DOSE, 4 MG/3ML SOPN Inject 1 mg into the skin once a week.   pantoprazole (PROTONIX) 40 MG tablet Take 40 mg by mouth daily.   pioglitazone (ACTOS) 15 MG tablet Take 15 mg by mouth daily.   Polyvinyl Alcohol-Povidone (CLEAR EYES NATURAL TEARS OP) Place 1 drop into both eyes 2 (two) times daily as needed (dry eyes).   pregabalin (LYRICA) 200 MG capsule Take 200 mg by mouth daily.   PRESCRIPTION MEDICATION Inhale  into the lungs at bedtime. CPAP   rosuvastatin (CRESTOR) 5 MG tablet TAKE 1 TABLET DAILY AT 6 P.M. (SCHEDULE YEARLY APPOINTMENT FOR FUTURE REFILLS, FINAL ATTEMPT)   traMADol (ULTRAM) 50 MG tablet Take 50 mg by mouth 3 (three) times daily as needed for pain.   tretinoin (RETIN-A) 0.1 % cream as needed.   XARELTO 20 MG TABS tablet TAKE 1 TABLET DAILY    Physical Exam:   VS:  BP 110/68   Pulse (!) 57   Ht 5\' 10"  (1.778 m)   Wt 200 lb (90.7 kg)   SpO2 92%   BMI 28.70 kg/m    Wt Readings from Last 3 Encounters:  06/25/23 200 lb (90.7 kg)  04/03/22 210 lb (95.3 kg)  10/08/21 215 lb (97.5 kg)    GEN: Well nourished, well developed in no acute distress NECK: No JVD; No carotid bruits CARDIAC: RRR, no murmurs, rubs, gallops RESPIRATORY:  Clear to auscultation without rales, wheezing or rhonchi  ABDOMEN: Soft, non-tender, non-distended EXTREMITIES:  No edema; No acute deformity  Asessement and Plan:.    Preoperative cardiac evaluation: Patient to undergo colonoscopy with Dr. Levora Angel on 07/27/23. Mr. Buhl's perioperative risk of a major cardiac event is 6.6% according to the Revised Cardiac Risk Index (RCRI).  Therefore, he is at high risk for perioperative complications.   His functional capacity is excellent at 8.97 METs according to the [facility] Activity  Status Index (DASI). Recommendations: According to ACC/AHA guidelines, no further cardiovascular testing needed.  The patient may proceed to surgery at acceptable risk.  Antiplatelet and/or Anticoagulation Recommendations:Xarelto (Rivaroxaban) can be held for 2 days prior to surgery.  Please resume post op when felt to be safe.     Paroxysmal atrial fibrillation: EKG today indicates sinus bradycardia at 57 bpm. Patient denies any palpitations or feeling of increased heart rates.  Patient reports that he is tolerating Xarelto well, denies any significant bleeding.  Continue Xarelto 20 mg daily. CHA2DS2-VASc Score = 8 [CHF History: 1, HTN  History: 1, Diabetes History: 1, Stroke History: 2, Vascular Disease History: 1, Age Score: 2, Gender Score: 0].  Therefore, the patient's annual risk of stroke is 10.8 %.      Hypertension: Blood pressure today 110/68. Continue lisinopril 5 mg daily.  Coronary artery calcification seen on CT: CAC score 107, 43rd percentile in 12/2019. Stable with no anginal symptoms. No indication for ischemic evaluation. Heart healthy diet and regular cardiovascular exercise encouraged.  On rosuvastatin 5 mg daily.    Disposition: F/u with Tereso Newcomer, PA or Reather Littler, NP in one year.   Signed, Rip Harbour, NP

## 2023-06-25 ENCOUNTER — Encounter: Payer: Self-pay | Admitting: Cardiology

## 2023-06-25 ENCOUNTER — Ambulatory Visit: Payer: Medicare PPO | Attending: Cardiology | Admitting: Cardiology

## 2023-06-25 VITALS — BP 110/68 | HR 57 | Ht 70.0 in | Wt 200.0 lb

## 2023-06-25 DIAGNOSIS — I10 Essential (primary) hypertension: Secondary | ICD-10-CM

## 2023-06-25 DIAGNOSIS — I480 Paroxysmal atrial fibrillation: Secondary | ICD-10-CM | POA: Diagnosis not present

## 2023-06-25 DIAGNOSIS — Z01810 Encounter for preprocedural cardiovascular examination: Secondary | ICD-10-CM | POA: Diagnosis not present

## 2023-06-25 DIAGNOSIS — I2510 Atherosclerotic heart disease of native coronary artery without angina pectoris: Secondary | ICD-10-CM | POA: Diagnosis not present

## 2023-06-25 NOTE — Patient Instructions (Signed)
 Medication Instructions:  No changes *If you need a refill on your cardiac medications before your next appointment, please call your pharmacy*  Lab Work: No labs If you have labs (blood work) drawn today and your tests are completely normal, you will receive your results only by: MyChart Message (if you have MyChart) OR A paper copy in the mail If you have any lab test that is abnormal or we need to change your treatment, we will call you to review the results.  Testing/Procedures: No testing  Follow-Up: At [facility], you and your health needs are our priority.  As part of our continuing mission to provide you with exceptional heart care, we have created designated Provider Care Teams.  These Care Teams include your primary Cardiologist (physician) and Advanced Practice Providers (APPs -  Physician Assistants and Nurse Practitioners) who all work together to provide you with the care you need, when you need it.  We recommend signing up for the patient portal called "MyChart".  Sign up information is provided on this After Visit Summary.  MyChart is used to connect with patients for Virtual Visits (Telemedicine).  Patients are able to view lab/test results, encounter notes, upcoming appointments, etc.  Non-urgent messages can be sent to your provider as well.   To learn more about what you can do with MyChart, go to [email or web address].    Your next appointment:   1 year(s)  Provider:   TBD  Other Instructions For your new Cardiologist you will need to look at the [facility] website under provider. Look at the Providers that work at [employer] or [employer] as the 2 are combining.

## 2023-06-30 ENCOUNTER — Telehealth: Payer: Self-pay | Admitting: Cardiology

## 2023-06-30 DIAGNOSIS — Z8673 Personal history of transient ischemic attack (TIA), and cerebral infarction without residual deficits: Secondary | ICD-10-CM | POA: Diagnosis not present

## 2023-06-30 DIAGNOSIS — G301 Alzheimer's disease with late onset: Secondary | ICD-10-CM | POA: Diagnosis not present

## 2023-06-30 DIAGNOSIS — M5417 Radiculopathy, lumbosacral region: Secondary | ICD-10-CM | POA: Diagnosis not present

## 2023-06-30 DIAGNOSIS — E669 Obesity, unspecified: Secondary | ICD-10-CM | POA: Diagnosis not present

## 2023-06-30 DIAGNOSIS — G5603 Carpal tunnel syndrome, bilateral upper limbs: Secondary | ICD-10-CM | POA: Diagnosis not present

## 2023-06-30 MED ORDER — ROSUVASTATIN CALCIUM 5 MG PO TABS: 5.0000 mg | ORAL_TABLET | Freq: Every day | ORAL | 3 refills | Status: AC

## 2023-06-30 NOTE — Telephone Encounter (Signed)
*  STAT* If patient is at the pharmacy, call can be transferred to refill team.   1. Which medications need to be refilled? (please list name of each medication and dose if known)   rosuvastatin (CRESTOR) 5 MG tablet   2. Which pharmacy/location (including street and city if local pharmacy) is medication to be sent to? [facility] - [place], [place] - [address] Phone: [phone]  Fax: [phone]   3. Do they need a 30 day or 90 day supply? 90  PT is out of medication

## 2023-07-14 DIAGNOSIS — G5603 Carpal tunnel syndrome, bilateral upper limbs: Secondary | ICD-10-CM | POA: Diagnosis not present

## 2023-07-14 DIAGNOSIS — M5417 Radiculopathy, lumbosacral region: Secondary | ICD-10-CM | POA: Diagnosis not present

## 2023-07-14 DIAGNOSIS — Z8673 Personal history of transient ischemic attack (TIA), and cerebral infarction without residual deficits: Secondary | ICD-10-CM | POA: Diagnosis not present

## 2023-07-14 DIAGNOSIS — G301 Alzheimer's disease with late onset: Secondary | ICD-10-CM | POA: Diagnosis not present

## 2023-07-14 DIAGNOSIS — E669 Obesity, unspecified: Secondary | ICD-10-CM | POA: Diagnosis not present

## 2023-07-16 ENCOUNTER — Other Ambulatory Visit: Payer: Self-pay | Admitting: Specialist

## 2023-07-16 DIAGNOSIS — M1009 Idiopathic gout, multiple sites: Secondary | ICD-10-CM | POA: Diagnosis not present

## 2023-07-16 DIAGNOSIS — M1991 Primary osteoarthritis, unspecified site: Secondary | ICD-10-CM | POA: Diagnosis not present

## 2023-07-16 DIAGNOSIS — E663 Overweight: Secondary | ICD-10-CM | POA: Diagnosis not present

## 2023-07-16 DIAGNOSIS — Z79899 Other long term (current) drug therapy: Secondary | ICD-10-CM | POA: Diagnosis not present

## 2023-07-16 DIAGNOSIS — Z452 Encounter for adjustment and management of vascular access device: Secondary | ICD-10-CM

## 2023-07-16 DIAGNOSIS — Z6828 Body mass index (BMI) 28.0-28.9, adult: Secondary | ICD-10-CM | POA: Diagnosis not present

## 2023-07-16 DIAGNOSIS — M0600 Rheumatoid arthritis without rheumatoid factor, unspecified site: Secondary | ICD-10-CM | POA: Diagnosis not present

## 2023-07-17 NOTE — Progress Notes (Signed)
 Chief Complaint: Patient was seen in consultation today for poor venous access  Referring Physician(s): Runheim,Andreas  History of Present Illness: Eddie Zimmerman is a 78 y.o. male with a medical history significant for stroke, heart failure, DM, atrial fibrillation, HTN, sleep apnea, obesity s/p lap band surgery, gout and rheumatoid arthritis. He also has Alzheimer's disease and receives infusions of Leqembi every two weeks. His neurology team has recommended port-a-catheter placement for durable venous access. He feels well today without complaints.  Past Medical History:  Diagnosis Date   Acute ischemic left MCA stroke (HCC) 08/21/2014   Aphasia    Arthritis    hands, wrist & Knee    Chronic diastolic heart failure (HCC) 06/08/2014   Cryptogenic stroke (HCC) 06/29/2014   CVA (cerebral vascular accident) (HCC) 06/14/2014   Depression    Diabetes (HCC) 08/21/2014   Diabetes mellitus without complication (HCC)    Dysrhythmia    ? Atrial fibrillation episode" Loop Recorder" implanted left chest- Varanasi follows   Essential hypertension, benign 04/07/2013   Gout    Hearing impaired person, bilateral    hearing aids used   History of kidney stones LAST MAY 2015   HLD (hyperlipidemia) 08/21/2014   Hx of laparoscopic gastric banding    remains in place   Hypertension    Hypertensive heart disease 03/17/2016   Hypertrophic obstructive cardiomyopathy (HCC) 04/07/2013   Obstructive sleep apnea 04/07/2013   PAF (paroxysmal atrial fibrillation) (HCC) 06/14/2015   Peripheral neuropathy    Sleep apnea    cpap use, SETTING OF 13   Slurred speech 06/08/2014   Stroke (HCC) 06-07-2014    Past Surgical History:  Procedure Laterality Date   ARTHROSCOPIC REPAIR PCL Bilateral    right- x1, left- x3    BUNIONECTOMY Left 07/03/2020   Procedure: BUNIONETTE EXCISION, DEBRIDEMENT OF CALCIFIC TENDINITIS AND BURSA;  Surgeon: Terance Hart, MD;  Location: [facility];  Service:  Orthopedics;  Laterality: Left;  LENGTH OF SURGERY: 1.5 HOURS   COLONOSCOPY WITH PROPOFOL N/A 10/24/2014   Procedure: COLONOSCOPY WITH PROPOFOL;  Surgeon: Charolett Bumpers, MD;  Location: [facility];  Service: Endoscopy;  Laterality: N/A;   CYSTOSCOPY WITH RETROGRADE PYELOGRAM, URETEROSCOPY AND STENT PLACEMENT Right 01/31/2016   Procedure: CYSTOSCOPY WITH RETROGRADE PYELOGRAM, URETEROSCOPY AND STENT PLACEMENT;  Surgeon: Heloise Purpura, MD;  Location: [facility];  Service: Urology;  Laterality: Right;   CYSTOSCOPY WITH URETHRAL DILATATION N/A 01/31/2016   Procedure: CYSTOSCOPY WITH URETHRAL BALLOON DILATATION;  Surgeon: Heloise Purpura, MD;  Location: [facility];  Service: Urology;  Laterality: N/A;   CYSTOSCOPY/URETEROSCOPY/HOLMIUM LASER/STENT PLACEMENT Bilateral 04/14/2016   Procedure: CYSTOSCOPY/URETEROSCOPY   RETROGRADE PYELOGRAM;  Surgeon: Heloise Purpura, MD;  Location: [facility];  Service: Urology;  Laterality: Bilateral;   ETHMOIDECTOMY Right 04/30/2018   Procedure: ANTERIOR ETHMOIDECTOMY RIGHT SIDE;  Surgeon: Drema Halon, MD;  Location: [facility];  Service: ENT;  Laterality: Right;   HERNIA REPAIR     abdominal   HOLMIUM LASER APPLICATION Right 01/31/2016   Procedure: HOLMIUM LASER APPLICATION;  Surgeon: Heloise Purpura, MD;  Location: [facility];  Service: Urology;  Laterality: Right;   JOINT REPLACEMENT Left    KNEE SURGERY Left    LAPAROSCOPIC GASTRIC BANDING     LOOP RECORDER IMPLANT N/A 07/12/2014   Procedure: LOOP RECORDER IMPLANT;  Surgeon: Marinus Maw, MD;  Location: [facility] C[facility] LAB;  Service: Cardiovascular;  Laterality: N/A;   LOOP RECORDER REMOVAL N/A 08/05/2016   Procedure: Loop Recorder Removal;  Surgeon:  Marinus Maw, MD;  Location: [facility] INVASIVE CV LAB;  Service: Cardiovascular;  Laterality: N/A;   MAXILLARY ANTROSTOMY Right 04/30/2018   Procedure: RIGHT MAXILLARY ANTROSTOMY WITH REMOVAL OF TISSUE;  Surgeon: Drema Halon, MD;  Location: [facility] OR;  Service: ENT;  Laterality: Right;   NASAL  SINUS SURGERY Right 04/30/2018   Procedure: FUNCTIONAL ENDOSCOPIC SINUS SURGERY;  Surgeon: Drema Halon, MD;  Location: [facility] OR;  Service: ENT;  Laterality: Right;   TARSAL TUNNEL RELEASE Bilateral 2005   TEE WITHOUT CARDIOVERSION N/A 06/14/2014   Procedure: TRANSESOPHAGEAL ECHOCARDIOGRAM (TEE);  Surgeon: Quintella Reichert, MD;  Location: [facility] ENDOSCOPY;  Service: Cardiovascular;  Laterality: N/A;   TONSILLECTOMY     TOTAL KNEE ARTHROPLASTY Right 06/07/2014   Procedure: TOTAL KNEE ARTHROPLASTY;  Surgeon: Nestor Lewandowsky, MD;  Location: [facility] OR;  Service: Orthopedics;  Laterality: Right;    Allergies: Patient has no known allergies.  Medications: Prior to Admission medications   Medication Sig Start Date End Date Taking? Authorizing Provider  allopurinol (ZYLOPRIM) 100 MG tablet Take 100 mg by mouth every morning.    [provider]  allopurinol (ZYLOPRIM) 300 MG tablet Take 300 mg by mouth every morning.    [provider]  amoxicillin (AMOXIL) 500 MG capsule Take 2,000 mg by mouth See admin instructions. Take 4 capsules (2000 mg) by mouth one hour prior to dental procedure 01/01/16   [provider]  Coenzyme Q10 (COQ-10) 200 MG CAPS Take 200 mg by mouth every morning. Patient not taking: Reported on 06/25/2023    [provider]  Cyanocobalamin (VITAMIN B-12 PO) Take 1 tablet by mouth every morning.    [provider]  empagliflozin (JARDIANCE) 10 MG TABS tablet Take 10 mg by mouth daily.    [provider]  escitalopram (LEXAPRO) 20 MG tablet Take 20 mg by mouth every morning.    [provider]  hydrochlorothiazide (MICROZIDE) 12.5 MG capsule Take 12.5 mg by mouth every morning.  Patient not taking: Reported on 06/25/2023    [provider]  hydroxychloroquine (PLAQUENIL) 200 MG tablet Take 200 mg by mouth 2 (two) times daily. 12/17/17   [provider]  lisinopril (ZESTRIL) 10 MG tablet Take 5 mg by mouth daily.  09/09/21   [provider]  meloxicam (MOBIC) 15 MG tablet Take 15 mg by mouth as needed for pain. Patient not taking: Reported on 06/25/2023 08/21/21   [provider]  metFORMIN (GLUCOPHAGE) 1000 MG tablet Take 1,000 mg by mouth 2 (two) times daily.    [provider]  Multiple Vitamin (MULTIVITAMIN WITH MINERALS) TABS tablet Take 1 tablet by mouth every morning. Men's One a Day    [provider]  OZEMPIC, 1 MG/DOSE, 4 MG/3ML SOPN Inject 1 mg into the skin once a week. 05/17/23   [provider]  pantoprazole (PROTONIX) 40 MG tablet Take 40 mg by mouth daily. 09/30/21   [provider]  pioglitazone (ACTOS) 15 MG tablet Take 15 mg by mouth daily.    [provider]  Polyvinyl Alcohol-Povidone (CLEAR EYES NATURAL TEARS OP) Place 1 drop into both eyes 2 (two) times daily as needed (dry eyes).    [provider]  pregabalin (LYRICA) 200 MG capsule Take 200 mg by mouth daily. 01/03/21   [provider]  PRESCRIPTION MEDICATION Inhale into the lungs at bedtime. CPAP    [provider]  rosuvastatin (CRESTOR) 5 MG tablet Take 1 tablet (5 mg total) by  mouth daily. TAKE 1 TABLET DAILY AT 6 P.M 06/30/23   Reather Littler D, NP  traMADol (ULTRAM) 50 MG tablet Take 50 mg by mouth 3 (three) times daily as needed for pain. 08/03/21   [provider]  tretinoin (RETIN-A) 0.1 % cream as needed. 09/09/21   [provider]  XARELTO 20 MG TABS tablet TAKE 1 TABLET DAILY 06/17/23   Reather Littler D, NP     Family History  Problem Relation Age of Onset   AAA (abdominal aortic aneurysm) Mother    Hypertension Mother    Alzheimer's disease Father    Cirrhosis Brother        of the liver   AAA (abdominal aortic aneurysm) Brother    Hypertension Brother    Heart attack Neg Hx     Social History   Socioeconomic History   Marital status: Married    Spouse name: Meriam Sprague   Number of children: 3   Years of education:  Master's   Highest education level: Not on file  Occupational History   Occupation: [employer]  Tobacco Use   Smoking status: Never   Smokeless tobacco: Never  Vaping Use   Vaping status: Never Used  Substance and Sexual Activity   Alcohol use: Yes    Alcohol/week: 0.0 standard drinks of alcohol    Comment: rare 1 PER month   Drug use: No   Sexual activity: Not on file  Other Topics Concern   Not on file  Social History Narrative   Lives at home with wife.   Left handed.    Caffeine use: Drinks 1 glass coffee, tea, soda per day. Varies.   Social Drivers of Corporate investment banker Strain: Not on file  Food Insecurity: Not on file  Transportation Needs: Not on file  Physical Activity: Not on file  Stress: Not on file  Social Connections: Not on file    Review of Systems: A 12 point ROS discussed and pertinent positives are indicated in the HPI above.  All other systems are negative.  Vital Signs: There were no vitals taken for this visit.  Physical Exam Constitutional:      General: He is not in acute distress. HENT:     Head: Normocephalic.  Eyes:     General: No scleral icterus. Cardiovascular:     Rate and Rhythm: Normal rate and regular rhythm.  Pulmonary:     Effort: No respiratory distress.  Abdominal:     General: There is no distension.  Skin:    General: Skin is warm and dry.  Neurological:     Mental Status: He is alert and oriented to person, place, and time.     Imaging: No results found.  Labs:  CBC: No results for input(s): "WBC", "HGB", "HCT", "PLT" in the last 8760 hours.  COAGS: No results for input(s): "INR", "APTT" in the last 8760 hours.  BMP: No results for input(s): "NA", "K", "CL", "CO2", "GLUCOSE", "BUN", "CALCIUM", "CREATININE", "GFRNONAA", "GFRAA" in the last 8760 hours.  Invalid input(s): "CMP"  LIVER FUNCTION TESTS: No results for input(s): "BILITOT", "AST", "ALT", "ALKPHOS", "PROT", "ALBUMIN" in the last 8760  hours.  TUMOR MARKERS: No results for input(s): "AFPTM", "CEA", "CA199", "CHROMGRNA" in the last 8760 hours.  Assessment and Plan:  Poor venous access: Eddie Zimmerman, 78 year old male, presents today for an image-guided port-a-catheter placement.   Risks and benefits of image-guided port-a-catheter placement were discussed with the patient including, but not limited to bleeding, infection,  pneumothorax, or fibrin sheath development and need for additional procedures.  All of the patient's questions were answered, patient is agreeable to proceed. He has been NPO.   Consent signed and in chart.  Marliss Coots, MD Pager: [phone]

## 2023-07-17 NOTE — Progress Notes (Signed)
 Reminder call regarding Eddie Zimmerman's port placement on 07/20/23. Instructions provided regarding NPO status, comfortable clothing, what to expect pre, during, and post procedure, reminded him he will need a driver, and we reviewed his allergies and medications. Time allowed to ask questions Eddie Zimmerman verbalized understanding.

## 2023-07-20 ENCOUNTER — Ambulatory Visit
Admission: RE | Admit: 2023-07-20 | Discharge: 2023-07-20 | Disposition: A | Discharge status: Home / Self Care | Source: Ambulatory Visit | Attending: Specialist | Admitting: Specialist

## 2023-07-20 DIAGNOSIS — Z452 Encounter for adjustment and management of vascular access device: Secondary | ICD-10-CM

## 2023-07-20 DIAGNOSIS — G309 Alzheimer's disease, unspecified: Secondary | ICD-10-CM | POA: Diagnosis not present

## 2023-07-20 DIAGNOSIS — R339 Retention of urine, unspecified: Secondary | ICD-10-CM | POA: Diagnosis not present

## 2023-07-20 HISTORY — PX: IR IMAGING GUIDED PORT INSERTION: IMG5740

## 2023-07-20 MED ORDER — MIDAZOLAM HCL 2 MG/2ML IJ SOLN
1.0000 mg | INTRAMUSCULAR | Status: DC | PRN
  Administered 2023-07-20: 1 mg via INTRAVENOUS

## 2023-07-20 MED ORDER — FENTANYL CITRATE PF 50 MCG/ML IJ SOSY
25.0000 ug | PREFILLED_SYRINGE | INTRAMUSCULAR | Status: DC | PRN
  Administered 2023-07-20: 50 ug via INTRAVENOUS

## 2023-07-20 MED ORDER — HEPARIN SOD (PORK) LOCK FLUSH 100 UNIT/ML IV SOLN
500.0000 [IU] | Freq: Once | INTRAVENOUS | Status: AC
  Administered 2023-07-20: 500 [IU]

## 2023-07-20 MED ORDER — SODIUM CHLORIDE 0.9 % IV SOLN: INTRAVENOUS | Status: DC

## 2023-07-20 MED ORDER — LIDOCAINE-EPINEPHRINE 1 %-1:100000 IJ SOLN
20.0000 mL | Freq: Once | INTRAMUSCULAR | Status: AC
  Administered 2023-07-20: 20 mL via INTRADERMAL

## 2023-07-20 NOTE — Procedures (Signed)
 Interventional Radiology Procedure Note  Procedure: Single Lumen Power Port Placement    Access:  Right internal jugular vein  Findings: Catheter tip positioned at cavoatrial junction. Port is ready for immediate use.   Complications: None  EBL: < 10 mL  Recommendations:  - Ok to shower in 24 hours - Do not submerge for 7 days - Routine line care    Marliss Coots, MD

## 2023-07-20 NOTE — Discharge Instructions (Signed)
 Implanted Port Insertion After Care   What can I expect after the procedure?   After the procedure, it is common to have:   Discomfort at the port insertion site.   Bruising on the skin over the port. This should improve over 3-4 days.    Port care   After your port is placed, you will get a manufacturer's information card. The card has information about your port. Keep this card with you at all times.   Take care of the port as told by your health care provider. Ask your health care provider if you or a family member can get training for taking care of the port at home.   Make sure to remember what type of port you have.       Incision care   Follow instructions from your health care provider about how to take care of your port insertion site. Make sure you:   Wash your hands with soap and water for at least 20 seconds before and after you change your bandage (dressing). If soap and water are not available, use hand sanitizer.        Leave your initial bandage on for a full 24 hours.     After 24 hours you may remove the dressing and shower.  Do not scrub directly on the incision site but rather above it and let the soapy water run over the incision.  Pat dry after.    You may then opt to redress your incision for your comfort but you may just leave it open to air.  The Dermabond (surgical super glue) will protect your incision and keep it clean and dry.    Leave the layer of skin glue in place. If the glue edges start to loosen and curl up, you may trim the loose edges but do not pull or pick at it.   Do NOT apply neosporin or other antibacterial ointment to the surgical glue.  It will dissolve the glue and expose your new incision to possible infection.     Do NOT apply EMLA numbing cream to the surgical glue.  It will dissolve the glue and expose your new incision to possible infection.  You may have to wait to use the EMLA cream until your incision has healed.    Check  your port insertion site every day for signs of infection. Check for:   Redness, swelling, or pain.   Fluid or blood.   Warmth.   Pus or a bad smell.      Activity   Return to your normal activities as told by your health care provider. Ask your health care provider what activities are safe for you.   You may have to avoid lifting. Ask your health care provider how much you can safely lift.   General instructions   Take over-the-counter and prescription medicines only as told by your health care provider.   Do not take baths, swim, or use a hot tub until your incision has healed completely (usually 2 weeks).    If you were given a sedative during the procedure, it can affect you for several hours. Do not drive, operate machinery or sign important documents for 24 hours after your procedure.   Keep all follow-up visits. This is important.   Please contact our office at [phone] or you may call [phone] and ask to speak with Cala Bradford the nurse for the following symptoms:   You have a fever or chills.  You have redness, swelling, or pain around your port insertion site.   You have fluid or blood coming from your port insertion site.   Your port insertion site feels warm to the touch.   You have pus or a bad smell coming from the port insertion site.   Get help right away if:   You have chest pain or shortness of breath.   You have bleeding from your port that you cannot control.   Do not wait to see if the symptoms will go away.   Do not drive yourself to the hospital.      These symptoms may be an emergency.    Get help right away. Call 911.     Summary   Take care of the port as told by your health care provider. Keep the manufacturer's information card with you at all times.   Keep your dressing on for 24 hours!  After that you can shower and redress the site only as needed.    No neosporin, antibiotic ointment or EMLA numbing cream on the glue  over your incision   Do not submerge your incision under water in a bath, pool or hot tube until fully healed   Contact a health care provider if you have a fever or chills or if you have redness, swelling, or pain around your port insertion site.   Keep all follow-up visits.   If you need to speak to someone after hours (5:00PM) please contact the on-call IR MD at [phone]. Tell them you are a patient of Dr. Elby Showers; you had a Port placed today and any issues you are experiencing.    Thank you for visiting [facility] today!

## 2023-07-27 DIAGNOSIS — K5730 Diverticulosis of large intestine without perforation or abscess without bleeding: Secondary | ICD-10-CM | POA: Diagnosis not present

## 2023-07-27 DIAGNOSIS — Z860101 Personal history of adenomatous and serrated colon polyps: Secondary | ICD-10-CM | POA: Diagnosis not present

## 2023-07-27 DIAGNOSIS — Z09 Encounter for follow-up examination after completed treatment for conditions other than malignant neoplasm: Secondary | ICD-10-CM | POA: Diagnosis not present

## 2023-07-27 DIAGNOSIS — K635 Polyp of colon: Secondary | ICD-10-CM | POA: Diagnosis not present

## 2023-07-27 NOTE — Progress Notes (Signed)
 Follow up to Mr. Prevette regarding his port placement on 07/20/23. Mr. Gongora stated he has not had any issues so far and that he will receive his first infusion tomorrow 07/28/23. He did report one of his 6 dogs jumped on him in the area of his port but no harm was done. I advised him to give the clinic a call if any issues arise or if he has any concerns or questions, he verbalized understanding.

## 2023-07-28 DIAGNOSIS — G5603 Carpal tunnel syndrome, bilateral upper limbs: Secondary | ICD-10-CM | POA: Diagnosis not present

## 2023-07-28 DIAGNOSIS — G301 Alzheimer's disease with late onset: Secondary | ICD-10-CM | POA: Diagnosis not present

## 2023-07-28 DIAGNOSIS — Z8673 Personal history of transient ischemic attack (TIA), and cerebral infarction without residual deficits: Secondary | ICD-10-CM | POA: Diagnosis not present

## 2023-07-28 DIAGNOSIS — M5417 Radiculopathy, lumbosacral region: Secondary | ICD-10-CM | POA: Diagnosis not present

## 2023-07-28 DIAGNOSIS — E0821 Diabetes mellitus due to underlying condition with diabetic nephropathy: Secondary | ICD-10-CM | POA: Diagnosis not present

## 2023-07-29 DIAGNOSIS — K635 Polyp of colon: Secondary | ICD-10-CM | POA: Diagnosis not present

## 2023-08-03 DIAGNOSIS — E1169 Type 2 diabetes mellitus with other specified complication: Secondary | ICD-10-CM | POA: Diagnosis not present

## 2023-08-03 DIAGNOSIS — G4730 Sleep apnea, unspecified: Secondary | ICD-10-CM | POA: Diagnosis not present

## 2023-08-03 DIAGNOSIS — Z8673 Personal history of transient ischemic attack (TIA), and cerebral infarction without residual deficits: Secondary | ICD-10-CM | POA: Diagnosis not present

## 2023-08-03 DIAGNOSIS — E7800 Pure hypercholesterolemia, unspecified: Secondary | ICD-10-CM | POA: Diagnosis not present

## 2023-08-03 DIAGNOSIS — I480 Paroxysmal atrial fibrillation: Secondary | ICD-10-CM | POA: Diagnosis not present

## 2023-08-03 DIAGNOSIS — I10 Essential (primary) hypertension: Secondary | ICD-10-CM | POA: Diagnosis not present

## 2023-08-03 DIAGNOSIS — M0600 Rheumatoid arthritis without rheumatoid factor, unspecified site: Secondary | ICD-10-CM | POA: Diagnosis not present

## 2023-08-03 DIAGNOSIS — Z23 Encounter for immunization: Secondary | ICD-10-CM | POA: Diagnosis not present

## 2023-08-03 DIAGNOSIS — F324 Major depressive disorder, single episode, in partial remission: Secondary | ICD-10-CM | POA: Diagnosis not present

## 2023-08-06 DIAGNOSIS — F331 Major depressive disorder, recurrent, moderate: Secondary | ICD-10-CM | POA: Diagnosis not present

## 2023-08-11 DIAGNOSIS — G603 Idiopathic progressive neuropathy: Secondary | ICD-10-CM | POA: Diagnosis not present

## 2023-08-11 DIAGNOSIS — G301 Alzheimer's disease with late onset: Secondary | ICD-10-CM | POA: Diagnosis not present

## 2023-08-11 DIAGNOSIS — Z8673 Personal history of transient ischemic attack (TIA), and cerebral infarction without residual deficits: Secondary | ICD-10-CM | POA: Diagnosis not present

## 2023-08-11 DIAGNOSIS — G5603 Carpal tunnel syndrome, bilateral upper limbs: Secondary | ICD-10-CM | POA: Diagnosis not present

## 2023-08-11 DIAGNOSIS — E0821 Diabetes mellitus due to underlying condition with diabetic nephropathy: Secondary | ICD-10-CM | POA: Diagnosis not present

## 2023-08-13 DIAGNOSIS — F331 Major depressive disorder, recurrent, moderate: Secondary | ICD-10-CM | POA: Diagnosis not present

## 2023-08-24 DIAGNOSIS — G319 Degenerative disease of nervous system, unspecified: Secondary | ICD-10-CM | POA: Diagnosis not present

## 2023-08-24 DIAGNOSIS — G301 Alzheimer's disease with late onset: Secondary | ICD-10-CM | POA: Diagnosis not present

## 2023-08-24 DIAGNOSIS — J320 Chronic maxillary sinusitis: Secondary | ICD-10-CM | POA: Diagnosis not present

## 2023-08-24 DIAGNOSIS — F0280 Dementia in other diseases classified elsewhere without behavioral disturbance: Secondary | ICD-10-CM | POA: Diagnosis not present

## 2023-08-25 DIAGNOSIS — Z8673 Personal history of transient ischemic attack (TIA), and cerebral infarction without residual deficits: Secondary | ICD-10-CM | POA: Diagnosis not present

## 2023-08-25 DIAGNOSIS — M5417 Radiculopathy, lumbosacral region: Secondary | ICD-10-CM | POA: Diagnosis not present

## 2023-08-25 DIAGNOSIS — G301 Alzheimer's disease with late onset: Secondary | ICD-10-CM | POA: Diagnosis not present

## 2023-08-25 DIAGNOSIS — E0821 Diabetes mellitus due to underlying condition with diabetic nephropathy: Secondary | ICD-10-CM | POA: Diagnosis not present

## 2023-08-25 DIAGNOSIS — G5603 Carpal tunnel syndrome, bilateral upper limbs: Secondary | ICD-10-CM | POA: Diagnosis not present

## 2023-09-03 ENCOUNTER — Telehealth: Payer: Self-pay | Admitting: *Deleted

## 2023-09-03 DIAGNOSIS — R1319 Other dysphagia: Secondary | ICD-10-CM | POA: Diagnosis not present

## 2023-09-03 DIAGNOSIS — Z9884 Bariatric surgery status: Secondary | ICD-10-CM | POA: Diagnosis not present

## 2023-09-03 DIAGNOSIS — K449 Diaphragmatic hernia without obstruction or gangrene: Secondary | ICD-10-CM | POA: Diagnosis not present

## 2023-09-03 NOTE — Telephone Encounter (Signed)
   Pre-operative Risk Assessment    Patient Name: Eddie Zimmerman  DOB: [birth year] MRN: [id number]   Date of last office visit: 06/25/2023 Date of next office visit: N/A   Request for Surgical Clearance    Procedure:  LAP BAND REMOVAL  Date of Surgery:  Clearance TBD                                Surgeon:  Teddie Favre, MD Surgeon's Group or Practice Name:  [facility] Phone number:  [phone] Fax number:  [phone]   Type of Clearance Requested:   - Medical  - Pharmacy:  Hold Rivaroxaban  (Xarelto ) NOT INDICATED   Type of Anesthesia:  General    Additional requests/questions:    Berenda Breaker   09/03/2023, 11:16 AM

## 2023-09-04 NOTE — Telephone Encounter (Signed)
 Please advise holding Xarelto  prior to lap band removal  Thank you!  DW

## 2023-09-07 NOTE — Telephone Encounter (Signed)
   Name: Eddie Zimmerman  DOB: 12/11/1945  MRN: [id number]  Primary Cardiologist: Avery Bodo, MD   Preoperative team, please contact this patient and set up a phone call appointment for further preoperative risk assessment. Please obtain consent and complete medication review. Thank you for your help.  I confirm that guidance regarding antiplatelet and oral anticoagulation therapy has been completed and, if necessary, noted below.  Per Pharm D, patient may hold Xarelto  for 2 days prior to procedure.    I also confirmed the patient resides in the state of [place] . As per [facility] Medical Board telemedicine laws, the patient must reside in the state in which the provider is licensed.   Morey Ar, NP 09/07/2023, 2:53 PM [facility]

## 2023-09-07 NOTE — Telephone Encounter (Signed)
 Patient with diagnosis of afib on Xarelto  for anticoagulation.    Procedure:  LAP BAND REMOVAL  Date of procedure: TBD   CHA2DS2-VASc Score = 8   This indicates a 10.8% annual risk of stroke. The patient's score is based upon: CHF History: 1 HTN History: 1 Diabetes History: 1 Stroke History: 2 Vascular Disease History: 1 Age Score: 2 Gender Score: 0      CrCl 58 ml/min Platelet count 176  Patient has not had an Afib/aflutter ablation within the last 3 months or DCCV within the last 30 days  Per office protocol, patient can hold Xarelto  for 2 days prior to procedure.    **This guidance is not considered finalized until pre-operative APP has relayed final recommendations.**

## 2023-09-07 NOTE — Telephone Encounter (Signed)
 Left message for pt to call our office and ask for the preop team to get TELE Preop Appt. Scheduled.

## 2023-09-08 DIAGNOSIS — M5417 Radiculopathy, lumbosacral region: Secondary | ICD-10-CM | POA: Diagnosis not present

## 2023-09-08 DIAGNOSIS — Z8673 Personal history of transient ischemic attack (TIA), and cerebral infarction without residual deficits: Secondary | ICD-10-CM | POA: Diagnosis not present

## 2023-09-08 DIAGNOSIS — G301 Alzheimer's disease with late onset: Secondary | ICD-10-CM | POA: Diagnosis not present

## 2023-09-08 DIAGNOSIS — G5603 Carpal tunnel syndrome, bilateral upper limbs: Secondary | ICD-10-CM | POA: Diagnosis not present

## 2023-09-08 DIAGNOSIS — E0821 Diabetes mellitus due to underlying condition with diabetic nephropathy: Secondary | ICD-10-CM | POA: Diagnosis not present

## 2023-09-08 NOTE — Telephone Encounter (Signed)
 2nd attempt to reach the pt to schedule tele preop appt.

## 2023-09-09 NOTE — Telephone Encounter (Signed)
 Called patient back, NA, left message to contact our office to schedule a telehealth pre-op clearance.

## 2023-09-09 NOTE — Telephone Encounter (Signed)
 Patient returned call

## 2023-09-11 ENCOUNTER — Telehealth: Payer: Self-pay | Admitting: *Deleted

## 2023-09-11 ENCOUNTER — Encounter (INDEPENDENT_AMBULATORY_CARE_PROVIDER_SITE_OTHER): Payer: Self-pay | Admitting: Otolaryngology

## 2023-09-11 ENCOUNTER — Ambulatory Visit (INDEPENDENT_AMBULATORY_CARE_PROVIDER_SITE_OTHER): Payer: Medicare PPO | Admitting: Otolaryngology

## 2023-09-11 VITALS — BP 87/50 | HR 66

## 2023-09-11 DIAGNOSIS — R0981 Nasal congestion: Secondary | ICD-10-CM

## 2023-09-11 DIAGNOSIS — J310 Chronic rhinitis: Secondary | ICD-10-CM

## 2023-09-11 DIAGNOSIS — J342 Deviated nasal septum: Secondary | ICD-10-CM | POA: Diagnosis not present

## 2023-09-11 NOTE — Telephone Encounter (Signed)
 Pt returning call

## 2023-09-11 NOTE — Progress Notes (Signed)
 Patient ID: Eddie Zimmerman, male   DOB: October 03, 1945, 78 y.o.   MRN: [id number]  Follow up: Chronic sinusitis, nasal septal deviation  HPI: The patient is a 78 year old male who returns today for his follow-up evaluation. The patient has a history of bilateral chronic pansinusitis. He previously underwent bilateral sinus surgery by Dr. Odean Bend. At his last visit in May 2024, no infection or crusting was noted. Sinus openings were widely patent on the right. He was also noted to have left septal deviation. According to the patient, he has been doing well since his last visit. He has been doing the nasal saline irrigation periodically. He denies any facial pain, nasal drainage, or fever.   Exam: General: Communicates without difficulty, well nourished, no acute distress. Head: Normocephalic, no evidence injury, no tenderness, facial buttresses intact without stepoff. Face/sinus: No tenderness to palpation and percussion. Facial movement is normal and symmetric. Eyes: PERRL, EOMI. No scleral icterus, conjunctivae clear. Neuro: CN II exam reveals vision grossly intact.  No nystagmus at any point of gaze. Ears: Auricles well formed without lesions.  Ear canals are intact without mass or lesion.  No erythema or edema is appreciated.  The TMs are intact without fluid. Nose: External evaluation reveals normal support and skin without lesions.  Dorsum is intact.  Anterior rhinoscopy reveals congested mucosa over anterior aspect of inferior turbinates and deviated septum.  No purulence noted. Oral:  Oral cavity and oropharynx are intact, symmetric, without erythema or edema.  Mucosa is moist without lesions. Neck: Full range of motion without pain.  There is no significant lymphadenopathy.  No masses palpable.  Thyroid  bed within normal limits to palpation.  Parotid glands and submandibular glands equal bilaterally without mass.  Trachea is midline. Neuro:  CN 2-12 grossly intact.   Assessment: 1. Left nasal septal  deviation.  2. Chronic rhinitis with mild nasal mucosal congestion. However, his sinus openings are patent. No debris or drainage is noted today.   Plan: 1. The physical exam findings are reviewed with the patient.  2. The patient should continue with nasal saline irrigation.  3. The patient should return for re-evaluation in 12 months, sooner if needed.

## 2023-09-11 NOTE — Telephone Encounter (Signed)
 Pt has been scheduled tele preop appt 09/22/23. Med rec and consent are done.    Patient Consent for Virtual Visit        Eddie Zimmerman has provided verbal consent on 09/11/2023 for a virtual visit (video or telephone).   CONSENT FOR VIRTUAL VISIT FOR:  Eddie Zimmerman  By participating in this virtual visit I agree to the following:  I hereby voluntarily request, consent and authorize [facility] and its employed or contracted physicians, physician assistants, nurse practitioners or other licensed health care professionals (the Practitioner), to provide me with telemedicine health care services (the "Services") as deemed necessary by the treating Practitioner. I acknowledge and consent to receive the Services by the Practitioner via telemedicine. I understand that the telemedicine visit will involve communicating with the Practitioner through live audiovisual communication technology and the disclosure of certain medical information by electronic transmission. I acknowledge that I have been given the opportunity to request an in-person assessment or other available alternative prior to the telemedicine visit and am voluntarily participating in the telemedicine visit.  I understand that I have the right to withhold or withdraw my consent to the use of telemedicine in the course of my care at any time, without affecting my right to future care or treatment, and that the Practitioner or I may terminate the telemedicine visit at any time. I understand that I have the right to inspect all information obtained and/or recorded in the course of the telemedicine visit and may receive copies of available information for a reasonable fee.  I understand that some of the potential risks of receiving the Services via telemedicine include:  Delay or interruption in medical evaluation due to technological equipment failure or disruption; Information transmitted may not be sufficient (e.g. poor resolution  of images) to allow for appropriate medical decision making by the Practitioner; and/or  In rare instances, security protocols could fail, causing a breach of personal health information.  Furthermore, I acknowledge that it is my responsibility to provide information about my medical history, conditions and care that is complete and accurate to the best of my ability. I acknowledge that Practitioner's advice, recommendations, and/or decision may be based on factors not within their control, such as incomplete or inaccurate data provided by me or distortions of diagnostic images or specimens that may result from electronic transmissions. I understand that the practice of medicine is not an exact science and that Practitioner makes no warranties or guarantees regarding treatment outcomes. I acknowledge that a copy of this consent can be made available to me via my patient portal [facility] MyChart), or I can request a printed copy by calling the office of [facility].    I understand that my insurance will be billed for this visit.   I have read or had this consent read to me. I understand the contents of this consent, which adequately explains the benefits and risks of the Services being provided via telemedicine.  I have been provided ample opportunity to ask questions regarding this consent and the Services and have had my questions answered to my satisfaction. I give my informed consent for the services to be provided through the use of telemedicine in my medical care

## 2023-09-11 NOTE — Telephone Encounter (Signed)
 Pt has been scheduled tele preop appt 09/22/23. Med rec and consent are done.

## 2023-09-13 DIAGNOSIS — J342 Deviated nasal septum: Secondary | ICD-10-CM | POA: Insufficient documentation

## 2023-09-13 DIAGNOSIS — J310 Chronic rhinitis: Secondary | ICD-10-CM | POA: Insufficient documentation

## 2023-09-22 ENCOUNTER — Ambulatory Visit: Attending: Cardiology

## 2023-09-22 DIAGNOSIS — Z8673 Personal history of transient ischemic attack (TIA), and cerebral infarction without residual deficits: Secondary | ICD-10-CM | POA: Diagnosis not present

## 2023-09-22 DIAGNOSIS — Z01810 Encounter for preprocedural cardiovascular examination: Secondary | ICD-10-CM

## 2023-09-22 DIAGNOSIS — G5603 Carpal tunnel syndrome, bilateral upper limbs: Secondary | ICD-10-CM | POA: Diagnosis not present

## 2023-09-22 DIAGNOSIS — E0821 Diabetes mellitus due to underlying condition with diabetic nephropathy: Secondary | ICD-10-CM | POA: Diagnosis not present

## 2023-09-22 DIAGNOSIS — M5417 Radiculopathy, lumbosacral region: Secondary | ICD-10-CM | POA: Diagnosis not present

## 2023-09-22 DIAGNOSIS — G301 Alzheimer's disease with late onset: Secondary | ICD-10-CM | POA: Diagnosis not present

## 2023-09-22 NOTE — Progress Notes (Signed)
 Virtual Visit via Telephone Note   Because of Eddie Zimmerman co-morbid illnesses, he is at least at moderate risk for complications without adequate follow up.  This format is felt to be most appropriate for this patient at this time.  Due to technical limitations with video connection (technology), today's appointment will be conducted as an audio only telehealth visit, and Eddie Zimmerman verbally agreed to proceed in this manner.   All issues noted in this document were discussed and addressed.  No physical exam could be performed with this format.  Evaluation Performed:  Preoperative cardiovascular risk assessment _____________   Date:  09/22/2023   Patient ID:  Eddie Zimmerman, DOB December 05, 1945, MRN [id number] Patient Location:  Home Provider location:   Office  Primary Care Provider:  Merl Star, MD Primary Cardiologist:  Avery Bodo, MD  Chief Complaint / Patient Profile   78 y.o. y/o male with a h/o paroxysmal AF, cryptogenic CVA s/p ILR, coronary calcifications, DM type II, HTN, HLD, OSA, obesity s/p lap band surgery who is pending Lap-Band removal and presents today for telephonic preoperative cardiovascular risk assessment.  History of Present Illness    Eddie Zimmerman is a 78 y.o. male who presents via audio/video conferencing for a telehealth visit today.  Pt was last seen in cardiology clinic on 06/25/2023 by Katlyn West, NP.  At that time Eddie Zimmerman was doing well he was granted clearance for colonoscopy procedure.  He reported going to the gym 3 to 4 days a week and exercising for an hour and a half. The patient is now pending procedure as outlined above. Since his last visit, he reports doing well with no new cardiac complaints.  He is still active and participating at the gym 3 to 4 days a week.  He denies chest pain, shortness of breath, lower extremity edema, fatigue, palpitations, melena, hematuria, hemoptysis, diaphoresis, weakness, presyncope, syncope,  orthopnea, and PND.    Past Medical History    Past Medical History:  Diagnosis Date   Acute ischemic left MCA stroke (HCC) 08/21/2014   Aphasia    Arthritis    hands, wrist & Knee    Chronic diastolic heart failure (HCC) 06/08/2014   Cryptogenic stroke (HCC) 06/29/2014   CVA (cerebral vascular accident) (HCC) 06/14/2014   Depression    Diabetes (HCC) 08/21/2014   Diabetes mellitus without complication (HCC)    Dysrhythmia    ? Atrial fibrillation episode" Loop Recorder" implanted left chest- Varanasi follows   Essential hypertension, benign 04/07/2013   Gout    Hearing impaired person, bilateral    hearing aids used   History of kidney stones LAST MAY 2015   HLD (hyperlipidemia) 08/21/2014   Hx of laparoscopic gastric banding    remains in place   Hypertension    Hypertensive heart disease 03/17/2016   Hypertrophic obstructive cardiomyopathy (HCC) 04/07/2013   Obstructive sleep apnea 04/07/2013   PAF (paroxysmal atrial fibrillation) (HCC) 06/14/2015   Peripheral neuropathy    Sleep apnea    cpap use, SETTING OF 13   Slurred speech 06/08/2014   Stroke (HCC) 06-07-2014   Past Surgical History:  Procedure Laterality Date   ARTHROSCOPIC REPAIR PCL Bilateral    right- x1, left- x3    BUNIONECTOMY Left 07/03/2020   Procedure: BUNIONETTE EXCISION, DEBRIDEMENT OF CALCIFIC TENDINITIS AND BURSA;  Surgeon: Donnamarie Gables, MD;  Location: [facility];  Service: Orthopedics;  Laterality: Left;  LENGTH OF SURGERY: 1.5 HOURS  COLONOSCOPY WITH PROPOFOL  N/A 10/24/2014   Procedure: COLONOSCOPY WITH PROPOFOL ;  Surgeon: Garrett Kallman, MD;  Location: [facility];  Service: Endoscopy;  Laterality: N/A;   CYSTOSCOPY WITH RETROGRADE PYELOGRAM, URETEROSCOPY AND STENT PLACEMENT Right 01/31/2016   Procedure: CYSTOSCOPY WITH RETROGRADE PYELOGRAM, URETEROSCOPY AND STENT PLACEMENT;  Surgeon: Florencio Hunting, MD;  Location: [facility];  Service: Urology;  Laterality: Right;   CYSTOSCOPY WITH  URETHRAL DILATATION N/A 01/31/2016   Procedure: CYSTOSCOPY WITH URETHRAL BALLOON DILATATION;  Surgeon: Florencio Hunting, MD;  Location: [facility];  Service: Urology;  Laterality: N/A;   CYSTOSCOPY/URETEROSCOPY/HOLMIUM LASER/STENT PLACEMENT Bilateral 04/14/2016   Procedure: CYSTOSCOPY/URETEROSCOPY   RETROGRADE PYELOGRAM;  Surgeon: Florencio Hunting, MD;  Location: [facility];  Service: Urology;  Laterality: Bilateral;   ETHMOIDECTOMY Right 04/30/2018   Procedure: ANTERIOR ETHMOIDECTOMY RIGHT SIDE;  Surgeon: Prescott Brodie, MD;  Location[facility] OR;  Service: ENT;  Laterality: Right;   HERNIA REPAIR     abdominal   HOLMIUM LASER APPLICATION Right 01/31/2016   Procedure: HOLMIUM LASER APPLICATION;  Surgeon: Florencio Hunting, MD;  Location: [facility];  Service: Urology;  Laterality: Right;   IR IMAGING GUIDED PORT INSERTION  07/20/2023   JOINT REPLACEMENT Left    KNEE SURGERY Left    LAPAROSCOPIC GASTRIC BANDING     LOOP RECORDER IMPLANT N/A 07/12/2014   Procedure: LOOP RECORDER IMPLANT;  Surgeon: Tammie Fall, MD;  Location: [facility] CATH LAB;  Service: Cardiovascular;  Laterality: N/A;   LOOP RECORDER REMOVAL N/A 08/05/2016   Procedure: Loop Recorder Removal;  Surgeon: Tammie Fall, MD;  Location: [facility] INVASIVE CV LAB;  Service: Cardiovascular;  Laterality: N/A;   MAXILLARY ANTROSTOMY Right 04/30/2018   Procedure: RIGHT MAXILLARY ANTROSTOMY WITH REMOVAL OF TISSUE;  Surgeon: Prescott Brodie, MD;  Location: [facility] OR;  Service: ENT;  Laterality: Right;   NASAL SINUS SURGERY Right 04/30/2018   Procedure: FUNCTIONAL ENDOSCOPIC SINUS SURGERY;  Surgeon: Prescott Brodie, MD;  Location: [facility] OR;  Service: ENT;  Laterality: Right;   TARSAL TUNNEL RELEASE Bilateral 2005   TEE WITHOUT CARDIOVERSION N/A 06/14/2014   Procedure: TRANSESOPHAGEAL ECHOCARDIOGRAM (TEE);  Surgeon: Jacqueline Matsu, MD;  Location: [facility] ENDOSCOPY;  Service: Cardiovascular;  Laterality: N/A;   TONSILLECTOMY     TOTAL KNEE ARTHROPLASTY Right 06/07/2014    Procedure: TOTAL KNEE ARTHROPLASTY;  Surgeon: Ilean Mall, MD;  Location: [facility] OR;  Service: Orthopedics;  Laterality: Right;    Allergies  No Known Allergies  Home Medications    Prior to Admission medications   Medication Sig Start Date End Date Taking? Authorizing Provider  allopurinol  (ZYLOPRIM ) 100 MG tablet Take 100 mg by mouth every morning.    [provider]  allopurinol  (ZYLOPRIM ) 300 MG tablet Take 300 mg by mouth every morning.    [provider]  amoxicillin (AMOXIL) 500 MG capsule Take 2,000 mg by mouth See admin instructions. Take 4 capsules (2000 mg) by mouth one hour prior to dental procedure Patient not taking: Reported on 09/11/2023 01/01/16   [provider]  Coenzyme Q10 (COQ-10) 200 MG CAPS Take 200 mg by mouth every morning. Patient not taking: Reported on 09/11/2023    [provider]  Cyanocobalamin (VITAMIN B-12 PO) Take 1 tablet by mouth every morning.    [provider]  empagliflozin (JARDIANCE) 10 MG TABS tablet Take 10 mg by mouth daily.    [provider]  escitalopram  (LEXAPRO ) 20 MG tablet Take 20 mg by mouth every morning.    [provider]  hydrochlorothiazide (MICROZIDE) 12.5 MG capsule Take 12.5 mg by mouth every morning.    [provider]  hydroxychloroquine (PLAQUENIL) 200 MG tablet Take 200 mg by mouth 2 (two) times daily. 12/17/17   [provider]  lisinopril  (ZESTRIL ) 10 MG tablet Take 5 mg by mouth daily. 09/09/21   [provider]  meloxicam (MOBIC) 15 MG tablet Take 15 mg by mouth as needed for pain. 08/21/21   [provider]  metFORMIN (GLUCOPHAGE) 1000 MG tablet Take 1,000 mg by mouth 2 (two) times daily.    [provider]  Multiple Vitamin (MULTIVITAMIN WITH MINERALS) TABS tablet Take 1 tablet by mouth every morning. Men's One a Day    [provider]  OZEMPIC, 1 MG/DOSE, 4 MG/3ML SOPN Inject 1 mg into the skin once a week.  05/17/23   [provider]  pantoprazole (PROTONIX) 40 MG tablet Take 40 mg by mouth daily. 09/30/21   [provider]  pioglitazone  (ACTOS ) 15 MG tablet Take 15 mg by mouth daily.    [provider]  Polyvinyl Alcohol-Povidone (CLEAR EYES NATURAL TEARS OP) Place 1 drop into both eyes 2 (two) times daily as needed (dry eyes).    [provider]  pregabalin (LYRICA) 200 MG capsule Take 200 mg by mouth daily. 01/03/21   [provider]  PRESCRIPTION MEDICATION Inhale into the lungs at bedtime. CPAP    [provider]  rosuvastatin  (CRESTOR ) 5 MG tablet Take 1 tablet (5 mg total) by mouth daily. TAKE 1 TABLET DAILY AT 6 P.M 06/30/23   West, Katlyn D, NP  traMADol (ULTRAM) 50 MG tablet Take 50 mg by mouth 3 (three) times daily as needed for pain. 08/03/21   [provider]  tretinoin (RETIN-A) 0.1 % cream as needed. 09/09/21   [provider]  XARELTO  20 MG TABS tablet TAKE 1 TABLET DAILY 06/17/23   West, Katlyn D, NP    Physical Exam    Vital Signs:  Eddie Zimmerman does not have vital signs available for review today.  Given telephonic nature of communication, physical exam is limited. AAOx3. NAD. Normal affect.  Speech and respirations are unlabored.  Accessory Clinical Findings    None  Assessment & Plan    1.  Preoperative Cardiovascular Risk Assessment: - Patient's RCRI score 6.6%  The patient affirms he has been doing well without any new cardiac symptoms. They are able to achieve 7 METS without cardiac limitations. Therefore, based on ACC/AHA guidelines, the patient would be at acceptable risk for the planned procedure without further cardiovascular testing. The patient was advised that if he develops new symptoms prior to surgery to contact our office to arrange for a follow-up visit, and he verbalized understanding.   The patient was advised that if he develops new symptoms prior to surgery to contact our office to  arrange for a follow-up visit, and he verbalized understanding.  Per office protocol, patient can hold Xarelto  for 2 days prior to procedure.    A copy of this note will be routed to requesting surgeon.  Time:   Today, I have spent 6 minutes with the patient with telehealth technology discussing medical history, symptoms, and management plan.     Francene Ing, Retha Cast, NP  09/22/2023, 8:06 AM

## 2023-10-02 DIAGNOSIS — G301 Alzheimer's disease with late onset: Secondary | ICD-10-CM | POA: Diagnosis not present

## 2023-10-02 DIAGNOSIS — F0280 Dementia in other diseases classified elsewhere without behavioral disturbance: Secondary | ICD-10-CM | POA: Diagnosis not present

## 2023-10-06 DIAGNOSIS — M5417 Radiculopathy, lumbosacral region: Secondary | ICD-10-CM | POA: Diagnosis not present

## 2023-10-06 DIAGNOSIS — G5603 Carpal tunnel syndrome, bilateral upper limbs: Secondary | ICD-10-CM | POA: Diagnosis not present

## 2023-10-06 DIAGNOSIS — G301 Alzheimer's disease with late onset: Secondary | ICD-10-CM | POA: Diagnosis not present

## 2023-10-06 DIAGNOSIS — Z8673 Personal history of transient ischemic attack (TIA), and cerebral infarction without residual deficits: Secondary | ICD-10-CM | POA: Diagnosis not present

## 2023-10-06 DIAGNOSIS — E0821 Diabetes mellitus due to underlying condition with diabetic nephropathy: Secondary | ICD-10-CM | POA: Diagnosis not present

## 2023-10-19 ENCOUNTER — Ambulatory Visit: Payer: Self-pay | Admitting: Surgery

## 2023-10-19 DIAGNOSIS — Z01818 Encounter for other preprocedural examination: Secondary | ICD-10-CM

## 2023-10-20 DIAGNOSIS — M5417 Radiculopathy, lumbosacral region: Secondary | ICD-10-CM | POA: Diagnosis not present

## 2023-10-20 DIAGNOSIS — G301 Alzheimer's disease with late onset: Secondary | ICD-10-CM | POA: Diagnosis not present

## 2023-10-20 DIAGNOSIS — G5603 Carpal tunnel syndrome, bilateral upper limbs: Secondary | ICD-10-CM | POA: Diagnosis not present

## 2023-10-20 DIAGNOSIS — Z8673 Personal history of transient ischemic attack (TIA), and cerebral infarction without residual deficits: Secondary | ICD-10-CM | POA: Diagnosis not present

## 2023-10-20 DIAGNOSIS — E0821 Diabetes mellitus due to underlying condition with diabetic nephropathy: Secondary | ICD-10-CM | POA: Diagnosis not present

## 2023-11-04 NOTE — Patient Instructions (Signed)
 SURGICAL WAITING ROOM VISITATION  Patients having surgery or a procedure may have no more than 2 support people in the waiting area - these visitors may rotate.    Children under the age of 60 must have an adult with them who is not the patient.  Visitors with respiratory illnesses are discouraged from visiting and should remain at home.  If the patient needs to stay at the hospital during part of their recovery, the visitor guidelines for inpatient rooms apply. Pre-op nurse will coordinate an appropriate time for 1 support person to accompany patient in pre-op.  This support person may not rotate.    Please refer to the [facility] website for the visitor guidelines for Inpatients (after your surgery is over and you are in a regular room).    Your procedure is scheduled on: 11/18/23   Report to [facility] Main Entrance    Report to admitting at 8:15 AM   Call this number if you have problems the morning of surgery [phone]   Do not eat food :After Midnight.   After Midnight you may have the following liquids until 7:30 AM DAY OF SURGERY  Water Non-Citrus Juices (without pulp, NO RED-Apple, White grape, White cranberry) Black Coffee (NO MILK/CREAM OR CREAMERS, sugar ok)  Clear Tea (NO MILK/CREAM OR CREAMERS, sugar ok) regular and decaf                             Plain Jell-O (NO RED)                                           Fruit ices (not with fruit pulp, NO RED)                                     Popsicles (NO RED)                                                               Sports drinks like Gatorade (NO RED)                      If you have questions, please contact your surgeon's office.   FOLLOW BOWEL PREP AND ANY ADDITIONAL PRE OP INSTRUCTIONS YOU RECEIVED FROM YOUR SURGEON'S OFFICE!!!     Oral Hygiene is also important to reduce your risk of infection.                                    Remember - BRUSH YOUR TEETH THE MORNING OF SURGERY WITH YOUR  REGULAR TOOTHPASTE  DENTURES WILL BE REMOVED PRIOR TO SURGERY PLEASE DO NOT APPLY Poly grip OR ADHESIVES!!!   Stop all vitamins and herbal supplements 7 days before surgery.   Last dose of Xarelto  11/15/23   Take these medicines the morning of surgery with A SIP OF WATER: Allopurinol , Lexapro , Lyrica, Tramadol   DO NOT TAKE ANY ORAL DIABETIC MEDICATIONS DAY OF YOUR SURGERY  How to  Manage Your Diabetes Before and After Surgery  Why is it important to control my blood sugar before and after surgery? Improving blood sugar levels before and after surgery helps healing and can limit problems. A way of improving blood sugar control is eating a healthy diet by:  Eating less sugar and carbohydrates  Increasing activity/exercise  Talking with your doctor about reaching your blood sugar goals High blood sugars (greater than 180 mg/dL) can raise your risk of infections and slow your recovery, so you will need to focus on controlling your diabetes during the weeks before surgery. Make sure that the doctor who takes care of your diabetes knows about your planned surgery including the date and location.  How do I manage my blood sugar before surgery? Check your blood sugar at least 4 times a day, starting 2 days before surgery, to make sure that the level is not too high or low. Check your blood sugar the morning of your surgery when you wake up and every 2 hours until you get to the Short Stay unit. If your blood sugar is less than 70 mg/dL, you will need to treat for low blood sugar: Do not take insulin . Treat a low blood sugar (less than 70 mg/dL) with  cup of clear juice (cranberry or apple), 4 glucose tablets, OR glucose gel. Recheck blood sugar in 15 minutes after treatment (to make sure it is greater than 70 mg/dL). If your blood sugar is not greater than 70 mg/dL on recheck, call [phone] for further instructions. Report your blood sugar to the short stay nurse when you get to Short  Stay.  If you are admitted to the hospital after surgery: Your blood sugar will be checked by the staff and you will probably be given insulin  after surgery (instead of oral diabetes medicines) to make sure you have good blood sugar levels. The goal for blood sugar control after surgery is 80-180 mg/dL.   WHAT DO I DO ABOUT MY DIABETES MEDICATION?  Do not take oral diabetes medicines (pills) the morning of surgery.  Do not take Ozempic after 11/10/23.  Do not take Jardiance after 11/14/23  THE DAY BEFORE SURGERY, take Metformin and ACTOS  as prescribed..      THE MORNING OF SURGERY, do not take Metformin or ACTOS .  DO NOT TAKE THE FOLLOWING 7 DAYS PRIOR TO SURGERY: Ozempic, Wegovy, Rybelsus (Semaglutide), Byetta (exenatide), Bydureon (exenatide ER), Victoza, Saxenda (liraglutide), or Trulicity (dulaglutide) Mounjaro (Tirzepatide) Adlyxin (Lixisenatide), Polyethylene Glycol Loxenatide.  Reviewed and Endorsed by [facility] Patient Education Committee, August 2015  Bring CPAP mask and tubing day of surgery.                              You may not have any metal on your body including jewelry, and body piercing             Do not wear lotions, powders, cologne, or deodorant              Men may shave face and neck.   Do not bring valuables to the hospital. [facility] IS NOT             RESPONSIBLE   FOR VALUABLES.   Contacts, glasses, dentures or bridgework may not be worn into surgery.  DO NOT BRING YOUR HOME MEDICATIONS TO THE HOSPITAL. PHARMACY WILL DISPENSE MEDICATIONS LISTED ON YOUR MEDICATION LIST TO YOU DURING YOUR ADMISSION IN THE HOSPITAL!  Patients discharged on the day of surgery will not be allowed to drive home.  Someone NEEDS to stay with you for the first 24 hours after anesthesia.   Special Instructions: Bring a copy of your healthcare power of attorney and living will documents the day of surgery if you haven't scanned them before.              Please read  over the following fact sheets you were given: IF YOU HAVE QUESTIONS ABOUT YOUR PRE-OP INSTRUCTIONS PLEASE CALL [phone] [place]   If you received a COVID test during your pre-op visit  it is requested that you wear a mask when out in public, stay away from anyone that may not be feeling well and notify your surgeon if you develop symptoms. If you test positive for Covid or have been in contact with anyone that has tested positive in the last 10 days please notify you surgeon.    [facility] - Preparing for Surgery Before surgery, you can play an important role.  Because skin is not sterile, your skin needs to be as free of germs as possible.  You can reduce the number of germs on your skin by washing with CHG (chlorahexidine gluconate) soap before surgery.  CHG is an antiseptic cleaner which kills germs and bonds with the skin to continue killing germs even after washing. Please DO NOT use if you have an allergy to CHG or antibacterial soaps.  If your skin becomes reddened/irritated stop using the CHG and inform your nurse when you arrive at Short Stay. Do not shave (including legs and underarms) for at least 48 hours prior to the first CHG shower.  You may shave your face/neck.  Please follow these instructions carefully:  1.  Shower with CHG Soap the night before surgery and the  morning of surgery.  2.  If you choose to wash your hair, wash your hair first as usual with your normal  shampoo.  3.  After you shampoo, rinse your hair and body thoroughly to remove the shampoo.                             4.  Use CHG as you would any other liquid soap.  You can apply chg directly to the skin and wash.  Gently with a scrungie or clean washcloth.  5.  Apply the CHG Soap to your body ONLY FROM THE NECK DOWN.   Do   not use on face/ open                           Wound or open sores. Avoid contact with eyes, ears mouth and   genitals (private parts).                       Wash face,  Genitals  (private parts) with your normal soap.             6.  Wash thoroughly, paying special attention to the area where your    surgery  will be performed.  7.  Thoroughly rinse your body with warm water from the neck down.  8.  DO NOT shower/wash with your normal soap after using and rinsing off the CHG Soap.                9.  Pat yourself dry with a clean towel.  10.  Wear clean pajamas.            11.  Place clean sheets on your bed the night of your first shower and do not  sleep with pets. Day of Surgery : Do not apply any lotions/deodorants the morning of surgery.  Please wear clean clothes to the hospital/surgery center.  FAILURE TO FOLLOW THESE INSTRUCTIONS MAY RESULT IN THE CANCELLATION OF YOUR SURGERY  PATIENT SIGNATURE_________________________________  NURSE SIGNATURE__________________________________  ________________________________________________________________________ WHAT IS A BLOOD TRANSFUSION? Blood Transfusion Information  A transfusion is the replacement of blood or some of its parts. Blood is made up of multiple cells which provide different functions. Red blood cells carry oxygen and are used for blood loss replacement. White blood cells fight against infection. Platelets control bleeding. Plasma helps clot blood. Other blood products are available for specialized needs, such as hemophilia or other clotting disorders. BEFORE THE TRANSFUSION  Who gives blood for transfusions?  Healthy volunteers who are fully evaluated to make sure their blood is safe. This is blood bank blood. Transfusion therapy is the safest it has ever been in the practice of medicine. Before blood is taken from a donor, a complete history is taken to make sure that person has no history of diseases nor engages in risky social behavior (examples are intravenous drug use or sexual activity with multiple partners). The donor's travel history is screened to minimize risk of transmitting  infections, such as malaria. The donated blood is tested for signs of infectious diseases, such as HIV and hepatitis. The blood is then tested to be sure it is compatible with you in order to minimize the chance of a transfusion reaction. If you or a relative donates blood, this is often done in anticipation of surgery and is not appropriate for emergency situations. It takes many days to process the donated blood. RISKS AND COMPLICATIONS Although transfusion therapy is very safe and saves many lives, the main dangers of transfusion include:  Getting an infectious disease. Developing a transfusion reaction. This is an allergic reaction to something in the blood you were given. Every precaution is taken to prevent this. The decision to have a blood transfusion has been considered carefully by your caregiver before blood is given. Blood is not given unless the benefits outweigh the risks. AFTER THE TRANSFUSION Right after receiving a blood transfusion, you will usually feel much better and more energetic. This is especially true if your red blood cells have gotten low (anemic). The transfusion raises the level of the red blood cells which carry oxygen, and this usually causes an energy increase. The nurse administering the transfusion will monitor you carefully for complications. HOME CARE INSTRUCTIONS  No special instructions are needed after a transfusion. You may find your energy is better. Speak with your caregiver about any limitations on activity for underlying diseases you may have. SEEK MEDICAL CARE IF:  Your condition is not improving after your transfusion. You develop redness or irritation at the intravenous (IV) site. SEEK IMMEDIATE MEDICAL CARE IF:  Any of the following symptoms occur over the next 12 hours: Shaking chills. You have a temperature by mouth above 102 F (38.9 C), not controlled by medicine. Chest, back, or muscle pain. People around you feel you are not acting correctly  or are confused. Shortness of breath or difficulty breathing. Dizziness and fainting. You get a rash or develop hives. You have a decrease in urine output. Your urine turns a dark color or changes to pink, red,  or brown. Any of the following symptoms occur over the next 10 days: You have a temperature by mouth above 102 F (38.9 C), not controlled by medicine. Shortness of breath. Weakness after normal activity. The white part of the eye turns yellow (jaundice). You have a decrease in the amount of urine or are urinating less often. Your urine turns a dark color or changes to pink, red, or brown. Document Released: 04/11/2000 Document Revised: 07/07/2011 Document Reviewed: 11/29/2007 [facility] Patient Information 2014 [place], [place].  _______________________________________________________________________

## 2023-11-04 NOTE — Progress Notes (Signed)
 COVID Vaccine Completed:  Date of COVID positive in last 90 days:  PCP - Tanda Bame, MD Cardiologist - Candyce Reek, MD  Chest x-ray - n/a EKG - 06/25/23 Epic Stress Test - 2010 ECHO - 2016 Cardiac Cath - n/a Pacemaker/ICD device last checked: n/a Spinal Cord Stimulator: n/a  Bowel Prep - no  Sleep Study - yes CPAP - yes every night  Fasting Blood Sugar - 100-125 Checks Blood Sugar two times a week  Last dose of GLP1 agonist-  Ozempic, takes Mondays  GLP1 instructions:  Do not take after  11/10/23   Last dose of SGLT-2 inhibitors-  Jardiance  SGLT-2 instructions:  Do not take after  11/14/23   Blood Thinner Instructions: Xarelto , hold 2 days Aspirin  Instructions: Last Dose: 11/15/23 1900  Activity level: Can go up a flight of stairs and perform activities of daily living without stopping and without symptoms of chest pain or shortness of breath.   Anesthesia review: CHF, HTN, PAF, CVA, stroke, OSA, DM2, needs cardaic clearance   Patient denies shortness of breath, fever, cough and chest pain at PAT appointment  Patient verbalized understanding of instructions that were given to them at the PAT appointment. Patient was also instructed that they will need to review over the PAT instructions again at home before surgery.

## 2023-11-05 ENCOUNTER — Other Ambulatory Visit: Payer: Self-pay

## 2023-11-05 ENCOUNTER — Encounter (HOSPITAL_COMMUNITY)
Admission: RE | Admit: 2023-11-05 | Discharge: 2023-11-05 | Disposition: A | Discharge status: Home / Self Care | Source: Ambulatory Visit | Attending: Surgery | Admitting: Surgery

## 2023-11-05 ENCOUNTER — Encounter (HOSPITAL_COMMUNITY): Payer: Self-pay

## 2023-11-05 VITALS — BP 110/74 | HR 60 | Temp 97.8°F | Resp 14 | Ht 70.0 in | Wt 192.0 lb

## 2023-11-05 DIAGNOSIS — E119 Type 2 diabetes mellitus without complications: Secondary | ICD-10-CM | POA: Insufficient documentation

## 2023-11-05 DIAGNOSIS — G4730 Sleep apnea, unspecified: Secondary | ICD-10-CM | POA: Insufficient documentation

## 2023-11-05 DIAGNOSIS — R1319 Other dysphagia: Secondary | ICD-10-CM | POA: Diagnosis not present

## 2023-11-05 DIAGNOSIS — I509 Heart failure, unspecified: Secondary | ICD-10-CM | POA: Insufficient documentation

## 2023-11-05 DIAGNOSIS — Z01818 Encounter for other preprocedural examination: Secondary | ICD-10-CM

## 2023-11-05 DIAGNOSIS — Z9884 Bariatric surgery status: Secondary | ICD-10-CM | POA: Insufficient documentation

## 2023-11-05 DIAGNOSIS — I110 Hypertensive heart disease with heart failure: Secondary | ICD-10-CM | POA: Insufficient documentation

## 2023-11-05 DIAGNOSIS — Z7984 Long term (current) use of oral hypoglycemic drugs: Secondary | ICD-10-CM | POA: Diagnosis not present

## 2023-11-05 DIAGNOSIS — Z8673 Personal history of transient ischemic attack (TIA), and cerebral infarction without residual deficits: Secondary | ICD-10-CM | POA: Diagnosis not present

## 2023-11-05 DIAGNOSIS — I480 Paroxysmal atrial fibrillation: Secondary | ICD-10-CM | POA: Insufficient documentation

## 2023-11-05 DIAGNOSIS — Z7901 Long term (current) use of anticoagulants: Secondary | ICD-10-CM | POA: Insufficient documentation

## 2023-11-05 DIAGNOSIS — Z01812 Encounter for preprocedural laboratory examination: Secondary | ICD-10-CM | POA: Diagnosis not present

## 2023-11-05 HISTORY — DX: Alzheimer's disease with early onset: G30.0

## 2023-11-05 HISTORY — DX: Dementia in other diseases classified elsewhere, unspecified severity, without behavioral disturbance, psychotic disturbance, mood disturbance, and anxiety: F02.80

## 2023-11-05 LAB — CBC WITH DIFFERENTIAL/PLATELET
Abs Immature Granulocytes: 0.02 K/uL (ref 0.00–0.07)
Basophils Absolute: 0 K/uL (ref 0.0–0.1)
Basophils Relative: 1 %
Eosinophils Absolute: 0.3 K/uL (ref 0.0–0.5)
Eosinophils Relative: 5 %
HCT: 42.4 % (ref 39.0–52.0)
Hemoglobin: 13.3 g/dL (ref 13.0–17.0)
Immature Granulocytes: 0 %
Lymphocytes Relative: 17 %
Lymphs Abs: 1.1 K/uL (ref 0.7–4.0)
MCH: 30.9 pg (ref 26.0–34.0)
MCHC: 31.4 g/dL (ref 30.0–36.0)
MCV: 98.6 fL (ref 80.0–100.0)
Monocytes Absolute: 0.7 K/uL (ref 0.1–1.0)
Monocytes Relative: 11 %
Neutro Abs: 4.5 K/uL (ref 1.7–7.7)
Neutrophils Relative %: 66 %
Platelets: 185 K/uL (ref 150–400)
RBC: 4.3 MIL/uL (ref 4.22–5.81)
RDW: 14.3 % (ref 11.5–15.5)
WBC: 6.7 K/uL (ref 4.0–10.5)
nRBC: 0 % (ref 0.0–0.2)

## 2023-11-05 LAB — HEMOGLOBIN A1C
Hgb A1c MFr Bld: 5.3 % (ref 4.8–5.6)
Mean Plasma Glucose: 105 mg/dL

## 2023-11-05 LAB — COMPREHENSIVE METABOLIC PANEL WITH GFR
ALT: 15 U/L (ref 0–44)
AST: 28 U/L (ref 15–41)
Albumin: 4.2 g/dL (ref 3.5–5.0)
Alkaline Phosphatase: 73 U/L (ref 38–126)
Anion gap: 10 (ref 5–15)
BUN: 25 mg/dL — ABNORMAL HIGH (ref 8–23)
CO2: 25 mmol/L (ref 22–32)
Calcium: 9.4 mg/dL (ref 8.9–10.3)
Chloride: 103 mmol/L (ref 98–111)
Creatinine, Ser: 1.3 mg/dL — ABNORMAL HIGH (ref 0.61–1.24)
GFR, Estimated: 57 mL/min — ABNORMAL LOW (ref >60)
Glucose, Bld: 96 mg/dL (ref 70–99)
Potassium: 5 mmol/L (ref 3.5–5.1)
Sodium: 138 mmol/L (ref 135–145)
Total Bilirubin: 0.9 mg/dL (ref 0.0–1.2)
Total Protein: 7.1 g/dL (ref 6.5–8.1)

## 2023-11-05 LAB — TYPE AND SCREEN
ABO/RH(D): A POS
Antibody Screen: NEGATIVE

## 2023-11-05 LAB — GLUCOSE, CAPILLARY: Glucose-Capillary: 104 mg/dL — ABNORMAL HIGH (ref 70–99)

## 2023-11-09 NOTE — Anesthesia Preprocedure Evaluation (Addendum)
 Anesthesia Evaluation  Patient identified by MRN, date of birth, ID band Patient awake    Reviewed: Allergy & Precautions, H&P , NPO status , Patient's Chart, lab work & pertinent test results  Airway Mallampati: II  TM Distance: >3 FB Neck ROM: Full    Dental no notable dental hx. (+) Dental Advisory Given   Pulmonary neg pulmonary ROS   Pulmonary exam normal breath sounds clear to auscultation       Cardiovascular Exercise Tolerance: Good hypertension, Pt. on medications negative cardio ROS Normal cardiovascular exam Rhythm:Regular Rate:Normal     Neuro/Psych negative neurological ROS  negative psych ROS   GI/Hepatic negative GI ROS, Neg liver ROS,,,  Endo/Other  negative endocrine ROSdiabetes, Type 2, Oral Hypoglycemic Agents    Renal/GU negative Renal ROS  negative genitourinary   Musculoskeletal negative musculoskeletal ROS (+)    Abdominal   Peds negative pediatric ROS (+)  Hematology negative hematology ROS (+)   Anesthesia Other Findings   Reproductive/Obstetrics negative OB ROS                              Anesthesia Physical Anesthesia Plan  ASA: 3  Anesthesia Plan: General   Post-op Pain Management:    Induction: Intravenous  PONV Risk Score and Plan: 2 and Dexamethasone , Ondansetron  and Treatment may vary due to age or medical condition  Airway Management Planned: LMA  Additional Equipment: None  Intra-op Plan:   Post-operative Plan: Extubation in OR  Informed Consent: I have reviewed the patients History and Physical, chart, labs and discussed the procedure including the risks, benefits and alternatives for the proposed anesthesia with the patient or authorized representative who has indicated his/her understanding and acceptance.     Dental advisory given  Plan Discussed with: CRNA  Anesthesia Plan Comments: (See PAT note 11/05/2023  DISCUSSION:78  y.o. never smoker with h/o HTN, sleep apnea, cryptogenic stroke 2016 s/p ILR that was removed in 2018, paroxysmal atrial fibrillation, CHF, DM II, lap band in place with dysphagia scheduled for above procedure 11/18/2023 with Dr. Deward Foy.    Last cardio visit 09/22/2023. Pt reports going to the gym 3-4 days a week without cv sx. coronary artery calcification by CT scan, CAC 107, 43rd percentile in 12/2019.  Per note, - Patient's RCRI score 6.6%  The patient affirms he has been doing well without any new cardiac symptoms. They are able to achieve 7 METS without cardiac limitations. Therefore, based on ACC/AHA guidelines, the patient would be at acceptable risk for the planned procedure without further cardiovascular testing. The patient was advised that if he develops new symptoms prior to surgery to contact our office to arrange for a follow-up visit, and he verbalized understanding.    The patient was advised that if he develops new symptoms prior to surgery to contact our office to arrange for a follow-up visit, and he verbalized understanding.   Per office protocol, patient can hold Xarelto  for 2 days prior to procedure.    CV: Echo 06/14/2014 - Left ventricle: The cavity size was normal. Wall thickness was    normal. Systolic function was normal. The estimated ejection    fraction was in the range of 55% to 60%. Wall motion was normal;    there were no regional wall motion abnormalities.  - Aortic valve: Mild thickening and calcification, consistent with    sclerosis. No evidence of vegetation. There was mild to moderate  regurgitation directed eccentrically in the LVOT and towards the    mitral anterior leaflet.  - Mitral valve: No evidence of vegetation. There was mild    regurgitation.  - Left atrium: No evidence of thrombus in the atrial cavity or    appendage. No evidence of thrombus in the appendage.  - Right atrium: No evidence of thrombus in the atrial cavity or     appendage.  - Atrial septum: No defect or patent foramen ovale was identified.    Echo contrast study showed no right-to-left atrial level shunt,    following an increase in RA pressure induced by provocative    maneuvers.  - Tricuspid valve: No evidence of vegetation.  - Pulmonic valve: No evidence of vegetation. There was trivial    regurgitation.   )         Anesthesia Quick Evaluation

## 2023-11-09 NOTE — Progress Notes (Signed)
 Anesthesia Chart Review   Case: [id number] Date/Time: 11/18/23 1015   Procedure: REMOVAL, GASTRIC BAND, LAPAROSCOPIC   Anesthesia type: General   Diagnosis:      Esophageal dysphagia [R13.19]     LAP-BAND surgery status [Z98.84]   Pre-op diagnosis: LAP BAND WITH DYSPHAGIA   Location: [facility]OM 01 / [facility]   Surgeons: Lyndel Deward PARAS, MD       DISCUSSION:77 y.o. never smoker with h/o HTN, sleep apnea, cryptogenic stroke 2016 s/p ILR that was removed in 2018, paroxysmal atrial fibrillation, CHF, DM II, lap band in place with dysphagia scheduled for above procedure 11/18/2023 with Dr. Deward Lyndel.   Last cardio visit 09/22/2023. Pt reports going to the gym 3-4 days a week without cv sx. coronary artery calcification by CT scan, CAC 107, 43rd percentile in 12/2019.  Per note, - Patient's RCRI score 6.6%  The patient affirms he has been doing well without any new cardiac symptoms. They are able to achieve 7 METS without cardiac limitations. Therefore, based on ACC/AHA guidelines, the patient would be at acceptable risk for the planned procedure without further cardiovascular testing. The patient was advised that if he develops new symptoms prior to surgery to contact our office to arrange for a follow-up visit, and he verbalized understanding.    The patient was advised that if he develops new symptoms prior to surgery to contact our office to arrange for a follow-up visit, and he verbalized understanding.   Per office protocol, patient can hold Xarelto  for 2 days prior to procedure.     VS: BP 110/74   Pulse 60   Temp 36.6 C (Oral)   Resp 14   Ht 5' 10 (1.778 m)   Wt 87.1 kg   SpO2 94%   BMI 27.55 kg/m   PROVIDERS: Rexanne Ingle, MD is PCP   Cardiologist - Candyce Reek, MD  LABS: Labs reviewed: Acceptable for surgery. (all labs ordered are listed, but only abnormal results are displayed)  Labs Reviewed  COMPREHENSIVE METABOLIC PANEL WITH GFR - Abnormal; Notable  for the following components:      Result Value   BUN 25 (*)    Creatinine, Ser 1.30 (*)    GFR, Estimated 57 (*)    All other components within normal limits  GLUCOSE, CAPILLARY - Abnormal; Notable for the following components:   Glucose-Capillary 104 (*)    All other components within normal limits  CBC WITH DIFFERENTIAL/PLATELET  HEMOGLOBIN A1C  TYPE AND SCREEN     IMAGES:   EKG:   CV: Echo 06/14/2014 - Left ventricle: The cavity size was normal. Wall thickness was    normal. Systolic function was normal. The estimated ejection    fraction was in the range of 55% to 60%. Wall motion was normal;    there were no regional wall motion abnormalities.  - Aortic valve: Mild thickening and calcification, consistent with    sclerosis. No evidence of vegetation. There was mild to moderate    regurgitation directed eccentrically in the LVOT and towards the    mitral anterior leaflet.  - Mitral valve: No evidence of vegetation. There was mild    regurgitation.  - Left atrium: No evidence of thrombus in the atrial cavity or    appendage. No evidence of thrombus in the appendage.  - Right atrium: No evidence of thrombus in the atrial cavity or    appendage.  - Atrial septum: No defect or patent foramen ovale was identified.  Echo contrast study showed no right-to-left atrial level shunt,    following an increase in RA pressure induced by provocative    maneuvers.  - Tricuspid valve: No evidence of vegetation.  - Pulmonic valve: No evidence of vegetation. There was trivial    regurgitation.  Past Medical History:  Diagnosis Date   Acute ischemic left MCA stroke (HCC) 08/21/2014   Aphasia    Arthritis    hands, wrist & Knee    Chronic diastolic heart failure (HCC) 06/08/2014   Cryptogenic stroke (HCC) 06/29/2014   CVA (cerebral vascular accident) (HCC) 06/14/2014   Depression    Diabetes (HCC) 08/21/2014   Diabetes mellitus without complication (HCC)    Dysrhythmia    ?  Atrial fibrillation episode Loop Recorder implanted left chest- Varanasi follows   Early onset Alzheimer dementia [facility])    Essential hypertension, benign 04/07/2013   Gout    Hearing impaired person, bilateral    hearing aids used   History of kidney stones LAST MAY 2015   HLD (hyperlipidemia) 08/21/2014   Hx of laparoscopic gastric banding    remains in place   Hypertension    Hypertensive heart disease 03/17/2016   Hypertrophic obstructive cardiomyopathy (HCC) 04/07/2013   Obstructive sleep apnea 04/07/2013   PAF (paroxysmal atrial fibrillation) (HCC) 06/14/2015   Peripheral neuropathy    Sleep apnea    cpap use, SETTING OF 13   Slurred speech 06/08/2014   Stroke (HCC) 06/07/2014    Past Surgical History:  Procedure Laterality Date   ARTHROSCOPIC REPAIR PCL Bilateral    right- x1, left- x3    BUNIONECTOMY Left 07/03/2020   Procedure: BUNIONETTE EXCISION, DEBRIDEMENT OF CALCIFIC TENDINITIS AND BURSA;  Surgeon: Elsa Lonni SAUNDERS, MD;  Location: [facility];  Service: Orthopedics;  Laterality: Left;  LENGTH OF SURGERY: 1.5 HOURS   COLONOSCOPY WITH PROPOFOL  N/A 10/24/2014   Procedure: COLONOSCOPY WITH PROPOFOL ;  Surgeon: Gladis MARLA Louder, MD;  [facility];  Service: Endoscopy;  Laterality: N/A;   CYSTOSCOPY WITH RETROGRADE PYELOGRAM, URETEROSCOPY AND STENT PLACEMENT Right 01/31/2016   Procedure: CYSTOSCOPY WITH RETROGRADE PYELOGRAM, URETEROSCOPY AND STENT PLACEMENT;  Surgeon: Gretel Ferrara, MD;  [facility];  Service: Urology;  Laterality: Right;   CYSTOSCOPY WITH URETHRAL DILATATION N/A 01/31/2016   Procedure: CYSTOSCOPY WITH URETHRAL BALLOON DILATATION;  Surgeon: Gretel Ferrara, MD;  [facility];  Service: Urology;  Laterality: N/A;   CYSTOSCOPY/URETEROSCOPY/HOLMIUM LASER/STENT PLACEMENT Bilateral 04/14/2016   Procedure: CYSTOSCOPY/URETEROSCOPY   RETROGRADE PYELOGRAM;  Surgeon: Gretel Ferrara, MD;  [facility];  Service: Urology;  Laterality:  Bilateral;   ETHMOIDECTOMY Right 04/30/2018   Procedure: ANTERIOR ETHMOIDECTOMY RIGHT SIDE;  Surgeon: Ethyl Lonni BRAVO, MD;  Location: [facility] OR;  Service: ENT;  Laterality: Right;   HERNIA REPAIR     abdominal   HOLMIUM LASER APPLICATION Right 01/31/2016   Procedure: HOLMIUM LASER APPLICATION;  Surgeon: Gretel Ferrara, MD;  [facility];  Service: Urology;  Laterality: Right;   IR IMAGING GUIDED PORT INSERTION  07/20/2023   JOINT REPLACEMENT Left    KNEE SURGERY Left    LAPAROSCOPIC GASTRIC BANDING     LOOP RECORDER IMPLANT N/A 07/12/2014   Procedure: LOOP RECORDER IMPLANT;  Surgeon: Danelle LELON Birmingham, MD;  Location: [facility] CATH LAB;  Service: Cardiovascular;  Laterality: N/A;   LOOP RECORDER REMOVAL N/A 08/05/2016   Procedure: Loop Recorder Removal;  Surgeon: Danelle LELON Birmingham, MD;  [facility];  Service: Cardiovascular;  Laterality: N/A;  MAXILLARY ANTROSTOMY Right 04/30/2018   Procedure: RIGHT MAXILLARY ANTROSTOMY WITH REMOVAL OF TISSUE;  Surgeon: Ethyl Lonni BRAVO, MD;  Location: [facility] OR;  Service: ENT;  Laterality: Right;   NASAL SINUS SURGERY Right 04/30/2018   Procedure: FUNCTIONAL ENDOSCOPIC SINUS SURGERY;  Surgeon: Ethyl Lonni BRAVO, MD;  Location: [facility] OR;  Service: ENT;  Laterality: Right;   TARSAL TUNNEL RELEASE Bilateral 2005   TEE WITHOUT CARDIOVERSION N/A 06/14/2014   Procedure: TRANSESOPHAGEAL ECHOCARDIOGRAM (TEE);  Surgeon: Wilbert JONELLE Bihari, MD;  Location: [facility] ENDOSCOPY;  Service: Cardiovascular;  Laterality: N/A;   TONSILLECTOMY     TOTAL KNEE ARTHROPLASTY Right 06/07/2014   Procedure: TOTAL KNEE ARTHROPLASTY;  Surgeon: Dempsey JINNY Sensor, MD;  [facility];  Service: Orthopedics;  Laterality: Right;    MEDICATIONS:  allopurinol  (ZYLOPRIM ) 100 MG tablet   allopurinol  (ZYLOPRIM ) 300 MG tablet   amoxicillin (AMOXIL) 500 MG capsule   Cyanocobalamin (VITAMIN B-12 PO)   cycloSPORINE (RESTASIS) 0.05 % ophthalmic emulsion   empagliflozin (JARDIANCE) 10 MG TABS tablet    escitalopram  (LEXAPRO ) 20 MG tablet   hydrochlorothiazide (MICROZIDE) 12.5 MG capsule   hydroxychloroquine (PLAQUENIL) 200 MG tablet   lisinopril  (ZESTRIL ) 5 MG tablet   metFORMIN (GLUCOPHAGE) 1000 MG tablet   Multiple Vitamin (MULTIVITAMIN WITH MINERALS) TABS tablet   OZEMPIC, 1 MG/DOSE, 4 MG/3ML SOPN   pioglitazone  (ACTOS ) 15 MG tablet   Polyvinyl Alcohol-Povidone (CLEAR EYES NATURAL TEARS OP)   pregabalin (LYRICA) 200 MG capsule   PRESCRIPTION MEDICATION   rosuvastatin  (CRESTOR ) 5 MG tablet   traMADol (ULTRAM) 50 MG tablet   tretinoin (RETIN-A) 0.1 % cream   XARELTO  20 MG TABS tablet   No current facility-administered medications for this encounter.     Harlene Hoots Ward, PA-C [facility] [phone]

## 2023-11-10 DIAGNOSIS — M25561 Pain in right knee: Secondary | ICD-10-CM | POA: Diagnosis not present

## 2023-11-10 DIAGNOSIS — S82091A Other fracture of right patella, initial encounter for closed fracture: Secondary | ICD-10-CM | POA: Diagnosis not present

## 2023-11-17 DIAGNOSIS — M7918 Myalgia, other site: Secondary | ICD-10-CM | POA: Diagnosis not present

## 2023-11-17 DIAGNOSIS — G5603 Carpal tunnel syndrome, bilateral upper limbs: Secondary | ICD-10-CM | POA: Diagnosis not present

## 2023-11-17 DIAGNOSIS — E0821 Diabetes mellitus due to underlying condition with diabetic nephropathy: Secondary | ICD-10-CM | POA: Diagnosis not present

## 2023-11-17 DIAGNOSIS — G894 Chronic pain syndrome: Secondary | ICD-10-CM | POA: Diagnosis not present

## 2023-11-17 DIAGNOSIS — Z8673 Personal history of transient ischemic attack (TIA), and cerebral infarction without residual deficits: Secondary | ICD-10-CM | POA: Diagnosis not present

## 2023-11-17 DIAGNOSIS — G301 Alzheimer's disease with late onset: Secondary | ICD-10-CM | POA: Diagnosis not present

## 2023-11-18 ENCOUNTER — Other Ambulatory Visit: Payer: Self-pay

## 2023-11-18 ENCOUNTER — Encounter (HOSPITAL_COMMUNITY): Payer: Self-pay | Admitting: Surgery

## 2023-11-18 ENCOUNTER — Encounter (HOSPITAL_COMMUNITY)
Admission: RE | Disposition: A | Discharge status: Home / Self Care | Payer: Self-pay | Source: Home / Self Care | Attending: Surgery

## 2023-11-18 ENCOUNTER — Ambulatory Visit (HOSPITAL_COMMUNITY): Payer: Self-pay | Admitting: Physician Assistant

## 2023-11-18 ENCOUNTER — Ambulatory Visit (HOSPITAL_COMMUNITY)
Admission: RE | Admit: 2023-11-18 | Discharge: 2023-11-18 | Disposition: A | Discharge status: Home / Self Care | Attending: Surgery | Admitting: Surgery

## 2023-11-18 ENCOUNTER — Ambulatory Visit (HOSPITAL_COMMUNITY): Payer: Self-pay | Admitting: Anesthesiology

## 2023-11-18 DIAGNOSIS — I110 Hypertensive heart disease with heart failure: Secondary | ICD-10-CM | POA: Diagnosis not present

## 2023-11-18 DIAGNOSIS — K429 Umbilical hernia without obstruction or gangrene: Secondary | ICD-10-CM | POA: Insufficient documentation

## 2023-11-18 DIAGNOSIS — Z7901 Long term (current) use of anticoagulants: Secondary | ICD-10-CM | POA: Insufficient documentation

## 2023-11-18 DIAGNOSIS — Z9884 Bariatric surgery status: Secondary | ICD-10-CM | POA: Diagnosis not present

## 2023-11-18 DIAGNOSIS — I421 Obstructive hypertrophic cardiomyopathy: Secondary | ICD-10-CM | POA: Insufficient documentation

## 2023-11-18 DIAGNOSIS — I5032 Chronic diastolic (congestive) heart failure: Secondary | ICD-10-CM | POA: Diagnosis not present

## 2023-11-18 DIAGNOSIS — Z4651 Encounter for fitting and adjustment of gastric lap band: Secondary | ICD-10-CM | POA: Diagnosis not present

## 2023-11-18 DIAGNOSIS — Z7985 Long-term (current) use of injectable non-insulin antidiabetic drugs: Secondary | ICD-10-CM | POA: Diagnosis not present

## 2023-11-18 DIAGNOSIS — R1310 Dysphagia, unspecified: Secondary | ICD-10-CM | POA: Insufficient documentation

## 2023-11-18 DIAGNOSIS — Z7984 Long term (current) use of oral hypoglycemic drugs: Secondary | ICD-10-CM | POA: Insufficient documentation

## 2023-11-18 DIAGNOSIS — F0283 Dementia in other diseases classified elsewhere, unspecified severity, with mood disturbance: Secondary | ICD-10-CM | POA: Diagnosis not present

## 2023-11-18 DIAGNOSIS — G4733 Obstructive sleep apnea (adult) (pediatric): Secondary | ICD-10-CM | POA: Insufficient documentation

## 2023-11-18 DIAGNOSIS — I480 Paroxysmal atrial fibrillation: Secondary | ICD-10-CM | POA: Diagnosis not present

## 2023-11-18 DIAGNOSIS — R1319 Other dysphagia: Secondary | ICD-10-CM | POA: Diagnosis not present

## 2023-11-18 DIAGNOSIS — K9509 Other complications of gastric band procedure: Secondary | ICD-10-CM | POA: Diagnosis not present

## 2023-11-18 DIAGNOSIS — E119 Type 2 diabetes mellitus without complications: Secondary | ICD-10-CM | POA: Insufficient documentation

## 2023-11-18 DIAGNOSIS — G300 Alzheimer's disease with early onset: Secondary | ICD-10-CM | POA: Insufficient documentation

## 2023-11-18 LAB — GLUCOSE, CAPILLARY
Glucose-Capillary: 92 mg/dL (ref 70–99)
Glucose-Capillary: 98 mg/dL (ref 70–99)

## 2023-11-18 SURGERY — REMOVAL, GASTRIC BAND, LAPAROSCOPIC
Anesthesia: General | Site: Abdomen

## 2023-11-18 MED ORDER — OXYCODONE HCL 5 MG PO TABS: 5.0000 mg | ORAL_TABLET | Freq: Once | ORAL | Status: DC | PRN

## 2023-11-18 MED ORDER — SUGAMMADEX SODIUM 200 MG/2ML IV SOLN
INTRAVENOUS | Status: DC | PRN
Start: 2023-11-18 — End: 2023-11-18
  Administered 2023-11-18: 300 mg via INTRAVENOUS

## 2023-11-18 MED ORDER — CEFAZOLIN SODIUM-DEXTROSE 2-3 GM-%(50ML) IV SOLR
INTRAVENOUS | Status: DC | PRN
  Administered 2023-11-18: 2 g via INTRAVENOUS

## 2023-11-18 MED ORDER — PROPOFOL 10 MG/ML IV BOLUS
INTRAVENOUS | Status: DC | PRN
  Administered 2023-11-18: 50 mg via INTRAVENOUS
  Administered 2023-11-18: 150 mg via INTRAVENOUS

## 2023-11-18 MED ORDER — MEPERIDINE HCL 25 MG/ML IJ SOLN: 6.2500 mg | INTRAMUSCULAR | Status: DC | PRN

## 2023-11-18 MED ORDER — PROPOFOL 10 MG/ML IV BOLUS
INTRAVENOUS | Status: AC
  Filled 2023-11-18: qty 20

## 2023-11-18 MED ORDER — STERILE WATER FOR IRRIGATION IR SOLN
Status: DC | PRN
  Administered 2023-11-18: 1000 mL

## 2023-11-18 MED ORDER — ONDANSETRON HCL 4 MG/2ML IJ SOLN
INTRAMUSCULAR | Status: DC | PRN
  Administered 2023-11-18: 4 mg via INTRAVENOUS

## 2023-11-18 MED ORDER — ACETAMINOPHEN 500 MG PO TABS
1000.0000 mg | ORAL_TABLET | Freq: Once | ORAL | Status: AC
  Administered 2023-11-18: 1000 mg via ORAL
  Filled 2023-11-18: qty 2

## 2023-11-18 MED ORDER — ONDANSETRON HCL 4 MG/2ML IJ SOLN: 4.0000 mg | Freq: Once | INTRAMUSCULAR | Status: DC | PRN

## 2023-11-18 MED ORDER — BUPIVACAINE-EPINEPHRINE (PF) 0.25% -1:200000 IJ SOLN
INTRAMUSCULAR | Status: AC
  Filled 2023-11-18: qty 30

## 2023-11-18 MED ORDER — CELECOXIB 200 MG PO CAPS
200.0000 mg | ORAL_CAPSULE | Freq: Once | ORAL | Status: AC
  Administered 2023-11-18: 200 mg via ORAL
  Filled 2023-11-18: qty 1

## 2023-11-18 MED ORDER — LACTATED RINGERS IV SOLN: INTRAVENOUS | Status: DC

## 2023-11-18 MED ORDER — CEFAZOLIN SODIUM 1 G IJ SOLR
INTRAMUSCULAR | Status: AC
  Filled 2023-11-18: qty 20

## 2023-11-18 MED ORDER — BUPIVACAINE-EPINEPHRINE 0.25% -1:200000 IJ SOLN
INTRAMUSCULAR | Status: DC | PRN
  Administered 2023-11-18: 30 mL

## 2023-11-18 MED ORDER — FENTANYL CITRATE PF 50 MCG/ML IJ SOSY: 25.0000 ug | PREFILLED_SYRINGE | INTRAMUSCULAR | Status: DC | PRN

## 2023-11-18 MED ORDER — CHLORHEXIDINE GLUCONATE 0.12 % MT SOLN
15.0000 mL | Freq: Once | OROMUCOSAL | Status: AC
  Administered 2023-11-18: 15 mL via OROMUCOSAL

## 2023-11-18 MED ORDER — LIDOCAINE HCL (CARDIAC) PF 100 MG/5ML IV SOSY
PREFILLED_SYRINGE | INTRAVENOUS | Status: DC | PRN
  Administered 2023-11-18: 100 mg via INTRAVENOUS

## 2023-11-18 MED ORDER — ROCURONIUM BROMIDE 100 MG/10ML IV SOLN
INTRAVENOUS | Status: DC | PRN
  Administered 2023-11-18: 70 mg via INTRAVENOUS

## 2023-11-18 MED ORDER — 0.9 % SODIUM CHLORIDE (POUR BTL) OPTIME
TOPICAL | Status: DC | PRN
  Administered 2023-11-18: 1000 mL

## 2023-11-18 MED ORDER — ORAL CARE MOUTH RINSE: 15.0000 mL | Freq: Once | OROMUCOSAL | Status: AC

## 2023-11-18 MED ORDER — FENTANYL CITRATE (PF) 100 MCG/2ML IJ SOLN
INTRAMUSCULAR | Status: AC
Start: 2023-11-18 — End: 2023-11-18
  Filled 2023-11-18: qty 2

## 2023-11-18 MED ORDER — INSULIN ASPART 100 UNIT/ML IJ SOLN: 0.0000 [IU] | INTRAMUSCULAR | Status: DC | PRN

## 2023-11-18 MED ORDER — PHENYLEPHRINE 80 MCG/ML (10ML) SYRINGE FOR IV PUSH (FOR BLOOD PRESSURE SUPPORT)
PREFILLED_SYRINGE | INTRAVENOUS | Status: DC | PRN
  Administered 2023-11-18: 80 ug via INTRAVENOUS

## 2023-11-18 MED ORDER — FENTANYL CITRATE (PF) 100 MCG/2ML IJ SOLN
INTRAMUSCULAR | Status: DC | PRN
  Administered 2023-11-18 (×2): 50 ug via INTRAVENOUS

## 2023-11-18 MED ORDER — SUGAMMADEX SODIUM 200 MG/2ML IV SOLN
INTRAVENOUS | Status: AC
  Filled 2023-11-18: qty 4

## 2023-11-18 MED ORDER — OXYCODONE HCL 5 MG/5ML PO SOLN: 5.0000 mg | Freq: Once | ORAL | Status: DC | PRN

## 2023-11-18 MED ORDER — DEXAMETHASONE SODIUM PHOSPHATE 10 MG/ML IJ SOLN
INTRAMUSCULAR | Status: DC | PRN
  Administered 2023-11-18: 10 mg via INTRAVENOUS

## 2023-11-18 MED ORDER — PHENYLEPHRINE HCL-NACL 20-0.9 MG/250ML-% IV SOLN
INTRAVENOUS | Status: AC
  Filled 2023-11-18: qty 250

## 2023-11-18 MED ORDER — OXYCODONE-ACETAMINOPHEN 5-325 MG PO TABS: 1.0000 | ORAL_TABLET | ORAL | 0 refills | Status: DC | PRN

## 2023-11-18 MED ORDER — EPHEDRINE SULFATE-NACL 50-0.9 MG/10ML-% IV SOSY
PREFILLED_SYRINGE | INTRAVENOUS | Status: DC | PRN
  Administered 2023-11-18 (×2): 10 mg via INTRAVENOUS

## 2023-11-18 SURGICAL SUPPLY — 42 items
BAG COUNTER SPONGE SURGICOUNT (BAG) IMPLANT
BENZOIN TINCTURE PRP APPL 2/3 (GAUZE/BANDAGES/DRESSINGS) IMPLANT
BLADE SURG 15 STRL LF DISP TIS (BLADE) ×2 IMPLANT
BLADE SURG SZ11 CARB STEEL (BLADE) ×2 IMPLANT
BNDG ADH 1X3 SHEER STRL LF (GAUZE/BANDAGES/DRESSINGS) ×12 IMPLANT
CABLE HIGH FREQUENCY MONO STRZ (ELECTRODE) IMPLANT
CHLORAPREP W/TINT 26 (MISCELLANEOUS) ×2 IMPLANT
COVER SURGICAL LIGHT HANDLE (MISCELLANEOUS) ×2 IMPLANT
DERMABOND ADVANCED .7 DNX12 (GAUZE/BANDAGES/DRESSINGS) IMPLANT
DEVICE SUTURE ENDOST 10MM (ENDOMECHANICALS) IMPLANT
DISSECTOR BLUNT TIP ENDO 5MM (MISCELLANEOUS) IMPLANT
DRAPE UTILITY XL STRL (DRAPES) ×2 IMPLANT
ELECT REM PT RETURN 15FT ADLT (MISCELLANEOUS) ×2 IMPLANT
GLOVE BIO SURGEON STRL SZ7.5 (GLOVE) ×2 IMPLANT
GLOVE INDICATOR 8.0 STRL GRN (GLOVE) ×2 IMPLANT
GOWN STRL REUS W/ TWL XL LVL3 (GOWN DISPOSABLE) ×4 IMPLANT
GRASPER SUT TROCAR 14GX15 (MISCELLANEOUS) IMPLANT
IRRIGATION SUCT STRKRFLW 2 WTP (MISCELLANEOUS) IMPLANT
KIT BASIN OR (CUSTOM PROCEDURE TRAY) ×2 IMPLANT
KIT TURNOVER KIT A (KITS) ×2 IMPLANT
MAT PREVALON FULL STRYKER (MISCELLANEOUS) ×2 IMPLANT
NDL SPNL 22GX3.5 QUINCKE BK (NEEDLE) ×2 IMPLANT
NEEDLE SPNL 22GX3.5 QUINCKE BK (NEEDLE) ×1 IMPLANT
NS IRRIG 1000ML POUR BTL (IV SOLUTION) ×2 IMPLANT
PACK UNIVERSAL I (CUSTOM PROCEDURE TRAY) ×2 IMPLANT
PENCIL SMOKE EVACUATOR (MISCELLANEOUS) IMPLANT
SET TUBE SMOKE EVAC HIGH FLOW (TUBING) ×2 IMPLANT
SHEARS HARMONIC 36 ACE (MISCELLANEOUS) IMPLANT
SLEEVE Z-THREAD 5X100MM (TROCAR) ×6 IMPLANT
SOLUTION ANTFG W/FOAM PAD STRL (MISCELLANEOUS) ×2 IMPLANT
SPIKE FLUID TRANSFER (MISCELLANEOUS) ×2 IMPLANT
STRIP CLOSURE SKIN 1/2X4 (GAUZE/BANDAGES/DRESSINGS) IMPLANT
SUT MNCRL AB 4-0 PS2 18 (SUTURE) ×2 IMPLANT
SUT SILK 0 30XBRD TIE 6 (SUTURE) ×2 IMPLANT
SUT VIC AB 2-0 SH 27X BRD (SUTURE) ×2 IMPLANT
SUT VICRYL 0 TIES 12 18 (SUTURE) IMPLANT
SUT VICRYL 0 UR6 27IN ABS (SUTURE) IMPLANT
SYR 20ML LL LF (SYRINGE) ×2 IMPLANT
SYSTEM KII OPTICAL ACCESS 15MM (TROCAR) IMPLANT
TOWEL OR 17X26 10 PK STRL BLUE (TOWEL DISPOSABLE) ×2 IMPLANT
TROCAR BALLN 12MMX100 BLUNT (TROCAR) IMPLANT
TROCAR Z-THREAD OPTICAL 5X100M (TROCAR) ×2 IMPLANT

## 2023-11-18 NOTE — Transfer of Care (Signed)
 Immediate Anesthesia Transfer of Care Note  Patient: Eddie Zimmerman  Procedure(s) Performed: REMOVAL, GASTRIC BAND, LAPAROSCOPIC (Abdomen)  Patient Location: PACU  Anesthesia Type:General  Level of Consciousness: awake, alert , and oriented  Airway & Oxygen Therapy: Patient Spontanous Breathing and Patient connected to face mask oxygen  Post-op Assessment: Report given to RN and Post -op Vital signs reviewed and stable  Post vital signs: Reviewed and stable  Last Vitals:  Vitals Value Taken Time  BP 114/59 11/18/23 10:56  Temp    Pulse 60 11/18/23 11:01  Resp 13 11/18/23 11:01  SpO2 96 % 11/18/23 11:01  Vitals shown include unfiled device data.  Last Pain:  Vitals:   11/18/23 9177  TempSrc: Oral         Complications: No notable events documented.

## 2023-11-18 NOTE — Op Note (Signed)
   Patient: Eddie Zimmerman (03-16-1946, [id number])  Date of Surgery: 11/18/2023  Preoperative Diagnosis: LAP BAND WITH DYSPHAGIA   Postoperative Diagnosis: LAP BAND WITH DYSPHAGIA   Surgical Procedure: REMOVAL, GASTRIC BAND, LAPAROSCOPIC: 56225 (CPT)    Operative Team Members:  Surgeons and Role:    * Adisynn Suleiman, Deward PARAS, MD - Primary   Anesthesiologist: Mallory Manus, MD CRNA: Thomos Janetta DEL, CRNA   Anesthesia: General   Fluids:  Total I/O In: 1000 [I.V.:1000] Out: -   Complications: None  Drains:  none   Specimen: * No specimens in log *   Disposition:  PACU - hemodynamically stable.  Plan of Care: Discharge to home after PACU    Indications for Procedure: Eddie Zimmerman is a 78 y.o. male who presented with signs and symptoms of an dysfunctional gastric band.  Imaging was reviewed.  The risks and benefits were discussed with the patient and family in extensive detail and the patient wished to proceed with removal of the adjustable gastric band and all components, intra-abdominal and subcutaneous.  Findings: Lap band removed entirely:   Infection status: Patient: Private Patient Elective Case Case: Elective Infection Present At Time Of Surgery (PATOS): None   Description of Procedure:   The patient was taken to the operating suite and placed in supine position.  General endotracheal anesthesia was induced.  Bilateral compression devices were placed on the patient's lower extremities to prevent blood clots.  The abdomen was then prepped and draped in the usual fashion.  Antibiotics were given just prior to the case start.  A time-out was completed verifying the correct patient, procedure, positioning and equipment needed for the case.  A verress needle was inserted at palmers point and the abdomen was inflated to 15mm of Hg.  A 15mm trocar was placed adjacent to the port, two 5mm trocars were placed in the left abdomen.  The band was identified.  We  followed the tubing until we reached the balloon.  Scissors were employed to dissect tissue from the buckle of the gastric band.  The tubing was cut with scissors.  Once it was free from its surrounding attachments were were able to unbuckle the device and remove it from the pocket surrounding the stomach.  The band balloon was then removed from the abdominal cavity via the 15 mm port.  There was some bleeding in the omentum from initial palmer's point inflation.  This was treated with electrocautery using the maryland  grasper.  Good hemostasis was observed intra-abdominally.   All ports were then removed and the abdomen deflated.   The subcutaneous reservoir was then dissected free of the surrounding tissue with bovie electrocautery.  It was freed from the fascia and subcutaneous tissue. The subcutaneous reservoir and all remaining components of the adjustable gastric band and attached polypropylene mesh was removed.  The subcutaneous pocket was irrigated.  The 15 mm port site was closed at the fascia with vicryl sutures .  The wound was then closed in layers with vicryl and monocryl. All other ports were closed with monocryl.  The wounds were anesthetized and covered with Dermabond.  The patient was awakened from anesthesia.  A post procedure timeout verified that all instrument, needles, and sponge counts were correct.  The patient was then extubated and transferred to the recovery room in a stable condition.   Deward Foy, MD General, Bariatric, & Minimally Invasive Surgery [facility], [place]

## 2023-11-18 NOTE — Anesthesia Postprocedure Evaluation (Signed)
 Anesthesia Post Note  Patient: Eddie Zimmerman  Procedure(s) Performed: REMOVAL, GASTRIC BAND, LAPAROSCOPIC (Abdomen)     Patient location during evaluation: PACU Anesthesia Type: General Level of consciousness: awake and alert Pain management: pain level controlled Vital Signs Assessment: post-procedure vital signs reviewed and stable Respiratory status: spontaneous breathing, nonlabored ventilation, respiratory function stable and patient connected to nasal cannula oxygen Cardiovascular status: blood pressure returned to baseline and stable Postop Assessment: no apparent nausea or vomiting Anesthetic complications: no   No notable events documented.  Last Vitals:  Vitals:   11/18/23 0822  BP: 108/67  Pulse: 62  Resp: 15  Temp: 37.1 C  SpO2: 95%    Last Pain:  Vitals:   11/18/23 0822  TempSrc: Oral                 Giovanne Nickolson

## 2023-11-18 NOTE — H&P (Signed)
 Admitting Physician: Deward PARAS Martha Soltys  Service: General Surgery   CC: Umbilical Hernia   Subjective   HPI: Eddie Zimmerman is an 78 y.o. male who is here for umbilical hernia repair  Past Medical History:  Diagnosis Date   Acute ischemic left MCA stroke (HCC) 08/21/2014   Aphasia    Arthritis    hands, wrist & Knee    Chronic diastolic heart failure (HCC) 06/08/2014   Cryptogenic stroke (HCC) 06/29/2014   CVA (cerebral vascular accident) (HCC) 06/14/2014   Depression    Diabetes (HCC) 08/21/2014   Diabetes mellitus without complication (HCC)    Dysrhythmia    ? Atrial fibrillation episode Loop Recorder implanted left chest- Varanasi follows   Early onset Alzheimer dementia [facility])    Essential hypertension, benign 04/07/2013   Gout    Hearing impaired person, bilateral    hearing aids used   History of kidney stones LAST MAY 2015   HLD (hyperlipidemia) 08/21/2014   Hx of laparoscopic gastric banding    remains in place   Hypertension    Hypertensive heart disease 03/17/2016   Hypertrophic obstructive cardiomyopathy (HCC) 04/07/2013   Obstructive sleep apnea 04/07/2013   PAF (paroxysmal atrial fibrillation) (HCC) 06/14/2015   Peripheral neuropathy    Sleep apnea    cpap use, SETTING OF 13   Slurred speech 06/08/2014   Stroke (HCC) 06/07/2014    Past Surgical History:  Procedure Laterality Date   ARTHROSCOPIC REPAIR PCL Bilateral    right- x1, left- x3    BUNIONECTOMY Left 07/03/2020   Procedure: BUNIONETTE EXCISION, DEBRIDEMENT OF CALCIFIC TENDINITIS AND BURSA;  Surgeon: Elsa Lonni SAUNDERS, MD;  Location:  SURGERY CENTER;  Service: Orthopedics;  Laterality: Left;  LENGTH OF SURGERY: 1.5 HOURS   COLONOSCOPY WITH PROPOFOL  N/A 10/24/2014   Procedure: COLONOSCOPY WITH PROPOFOL ;  Surgeon: Gladis MARLA Louder, MD;  [facility];  Service: Endoscopy;  Laterality: N/A;   CYSTOSCOPY WITH RETROGRADE PYELOGRAM, URETEROSCOPY AND STENT PLACEMENT Right  01/31/2016   Procedure: CYSTOSCOPY WITH RETROGRADE PYELOGRAM, URETEROSCOPY AND STENT PLACEMENT;  Surgeon: Gretel Ferrara, MD;  [facility];  Service: Urology;  Laterality: Right;   CYSTOSCOPY WITH URETHRAL DILATATION N/A 01/31/2016   Procedure: CYSTOSCOPY WITH URETHRAL BALLOON DILATATION;  Surgeon: Gretel Ferrara, MD;  [facility];  Service: Urology;  Laterality: N/A;   CYSTOSCOPY/URETEROSCOPY/HOLMIUM LASER/STENT PLACEMENT Bilateral 04/14/2016   Procedure: CYSTOSCOPY/URETEROSCOPY   RETROGRADE PYELOGRAM;  Surgeon: Gretel Ferrara, MD;  [facility];  Service: Urology;  Laterality: Bilateral;   ETHMOIDECTOMY Right 04/30/2018   Procedure: ANTERIOR ETHMOIDECTOMY RIGHT SIDE;  Surgeon: Ethyl Lonni BRAVO, MD;  [facility] OR;  Service: ENT;  Laterality: Right;   HERNIA REPAIR     abdominal   HOLMIUM LASER APPLICATION Right 01/31/2016   Procedure: HOLMIUM LASER APPLICATION;  Surgeon: Gretel Ferrara, MD;  [facility];  Service: Urology;  Laterality: Right;   IR IMAGING GUIDED PORT INSERTION  07/20/2023   JOINT REPLACEMENT Left    KNEE SURGERY Left    LAPAROSCOPIC GASTRIC BANDING     LOOP RECORDER IMPLANT N/A 07/12/2014   Procedure: LOOP RECORDER IMPLANT;  Surgeon: Danelle LELON Birmingham, MD;  [facility] CATH LAB;  Service: Cardiovascular;  Laterality: N/A;   LOOP RECORDER REMOVAL N/A 08/05/2016   Procedure: Loop Recorder Removal;  Surgeon: Danelle LELON Birmingham, MD;  [facility];  Service: Cardiovascular;  Laterality: N/A;   MAXILLARY ANTROSTOMY Right 04/30/2018   Procedure: RIGHT MAXILLARY ANTROSTOMY WITH REMOVAL OF  TISSUE;  Surgeon: Ethyl Lonni BRAVO, MD;  Location: [facility] OR;  Service: ENT;  Laterality: Right;   NASAL SINUS SURGERY Right 04/30/2018   Procedure: FUNCTIONAL ENDOSCOPIC SINUS SURGERY;  Surgeon: Ethyl Lonni BRAVO, MD;  Location: [facility] OR;  Service: ENT;  Laterality: Right;   TARSAL TUNNEL RELEASE Bilateral 2005   TEE WITHOUT CARDIOVERSION N/A 06/14/2014   Procedure:  TRANSESOPHAGEAL ECHOCARDIOGRAM (TEE);  Surgeon: Wilbert JONELLE Bihari, MD;  Location: [facility] ENDOSCOPY;  Service: Cardiovascular;  Laterality: N/A;   TONSILLECTOMY     TOTAL KNEE ARTHROPLASTY Right 06/07/2014   Procedure: TOTAL KNEE ARTHROPLASTY;  Surgeon: Dempsey JINNY Sensor, MD;  [facility];  Service: Orthopedics;  Laterality: Right;    Family History  Problem Relation Age of Onset   AAA (abdominal aortic aneurysm) Mother    Hypertension Mother    Alzheimer's disease Father    Cirrhosis Brother        of the liver   AAA (abdominal aortic aneurysm) Brother    Hypertension Brother    Heart attack Neg Hx     Social:  reports that he has never smoked. He has never used smokeless tobacco. He reports that he does not currently use alcohol. He reports that he does not use drugs.  Allergies: No Known Allergies  Medications: Current Outpatient Medications  Medication Instructions   allopurinol  (ZYLOPRIM ) 300 mg, Every morning   allopurinol  (ZYLOPRIM ) 100 mg, Every morning   amoxicillin (AMOXIL) 2,000 mg, See admin instructions   Cyanocobalamin (VITAMIN B-12 PO) 1 tablet, Every morning   cycloSPORINE (RESTASIS) 0.05 % ophthalmic emulsion 1 drop, Both Eyes, Daily PRN   empagliflozin (JARDIANCE) 10 mg, Daily   escitalopram  (LEXAPRO ) 20 mg, Every morning   hydrochlorothiazide (MICROZIDE) 12.5 mg, Every morning   hydroxychloroquine (PLAQUENIL) 200 mg, 2 times daily   lisinopril  (ZESTRIL ) 5 mg, Daily   metFORMIN (GLUCOPHAGE) 1,000 mg, 2 times daily   Multiple Vitamin (MULTIVITAMIN WITH MINERALS) TABS tablet 1 tablet, Every morning   Ozempic (1 MG/DOSE) 1 mg, Subcutaneous, Weekly   pioglitazone  (ACTOS ) 15 mg, Oral, Daily   Polyvinyl Alcohol-Povidone (CLEAR EYES NATURAL TEARS OP) 1 drop, 2 times daily PRN   pregabalin (LYRICA) 200 mg, 3 times daily   PRESCRIPTION MEDICATION Daily at bedtime   rosuvastatin  (CRESTOR ) 5 mg, Oral, Daily, TAKE 1 TABLET DAILY AT 6 P.M   traMADol (ULTRAM) 25-50 mg, 3 times  daily PRN   tretinoin (RETIN-A) 0.1 % cream 1 application , At bedtime PRN   Xarelto  20 mg, Oral, Daily    ROS - all of the below systems have been reviewed with the patient and positives are indicated with bold text General: chills, fever or night sweats Eyes: blurry vision or double vision ENT: epistaxis or sore throat Allergy/Immunology: itchy/watery eyes or nasal congestion Hematologic/Lymphatic: bleeding problems, blood clots or swollen lymph nodes Endocrine: temperature intolerance or unexpected weight changes Breast: new or changing breast lumps or nipple discharge Resp: cough, shortness of breath, or wheezing CV: chest pain or dyspnea on exertion GI: as per HPI GU: dysuria, trouble voiding, or hematuria MSK: joint pain or joint stiffness Neuro: TIA or stroke symptoms Derm: pruritus and skin lesion changes Psych: anxiety and depression  Objective   PE Blood pressure 108/67, pulse 62, temperature 98.7 F (37.1 C), temperature source Oral, resp. rate 15, height 5' 10 (1.778 m), weight 87.1 kg, SpO2 95%. Constitutional: NAD; conversant; no deformities Eyes: Moist conjunctiva; no lid lag; anicteric; PERRL Neck: Trachea midline; no thyromegaly Lungs: Normal  respiratory effort; no tactile fremitus CV: RRR; no palpable thrills; no pitting edema GI: Abd Umbilical hernia; no palpable hepatosplenomegaly MSK: Normal range of motion of extremities; no clubbing/cyanosis Psychiatric: Appropriate affect; alert and oriented x3 Lymphatic: No palpable cervical or axillary lymphadenopathy  No results found for this or any previous visit (from the past 24 hours).  Imaging Orders  No imaging studies ordered today     Assessment and Plan   Mr. Veneta is having regurgitation and difficulty with his Lap-Band and would like it removed.  I recommended laparoscopic Lap-Band removal.  We discussed the procedure itself as well as its risk, benefits, and alternatives.  After full discussion  all questions answered the patient granted consent to proceed.  He does have a small hiatal hernia on MRI, however I would not recommend fixing this at the time of Lap-Band removal due to the increased risk and the possibility that it is asymptomatic.      ICD-10-CM   1. Diabetes mellitus without complication (HCC)  E11.9 CBG per Guidelines for Diabetes Management for Patients Undergoing Surgery ([facility], [facility], and [facility] only)    CBG per Guidelines for Diabetes Management for Patients Undergoing Surgery ([facility], [facility], and [facility] only)       Deward JINNY Foy, MD  [facility] Use [email or web address] to contact on call provider

## 2023-11-18 NOTE — Discharge Instructions (Signed)
 LAP BAND REMOVAL POST OPERATIVE INSTRUCTIONS  Thinking Clearly  The anesthesia may cause you to feel different for 1 or 2 days. Do not drive, drink alcohol, or make any big decisions for at least 2 days.  Nutrition When you wake up, you will be able to drink small amounts of liquid. If you do not feel sick, you can slowly advance your diet to regular foods. Continue to drink lots of fluids, usually about 8 to 10 glasses per day. Eat a high-fiber diet so you don't strain during bowel movements. High-Fiber Foods Foods high in fiber include beans, bran cereals and whole-grain breads, peas, dried fruit (figs, apricots, and dates), raspberries, blackberries, strawberries, sweet corn, broccoli, baked potatoes with skin, plums, pears, apples, greens, and nuts. Activity Slowly increase your activity. Be sure to get up and walk every hour or so to prevent blood clots. No heavy lifting or strenuous activity for 4 weeks following surgery to prevent hernias at your incision sites It is normal to feel tired. You may need more sleep than usual.  Get your rest but make sure to get up and move around frequently to prevent blood clots and pneumonia.  Work and Return to [employer] can go back to work when you feel well enough. Discuss the timing with your surgeon. You can usually go back to school or work 1 week after an operation. If your work requires heavy lifting or strenuous activity you need to be placed on light duty for 4 weeks following surgery. You can return to gym class, sports or other physical activities 4 weeks after surgery.  Wound Care Always wash your hands before and after touching near your incision site. Do not soak in a bathtub until cleared at your follow up appointment. You may take a shower 24 hours after surgery. A small amount of drainage from the incision is normal. If the drainage is thick and yellow or the site is red, you may have an infection, so call your surgeon. If you  have a drain in one of your incisions, it will be taken out in office when the drainage stops. Steri-Strips will fall off in 7 to 10 days or they will be removed during your first office visit. If you have dermabond glue covering over the incision, allow the glue to flake off on its own. Avoid wearing tight or rough clothing. It may rub your incisions and make it harder for them to heal. Protect the new skin, especially from the sun. The sun can burn and cause darker scarring. Your scar will heal in about 4 to 6 weeks and will become softer and continue to fade over the next year.  The cosmetic appearance of the incisions will improve over the course of the first year after surgery. Sensation around your incision will return in a few weeks or months.  Bowel Movements After intestinal surgery, you may have loose watery stools for several days. If watery diarrhea lasts longer than 3 days, contact your surgeon. Pain medication (narcotics) can cause constipation. Increase the fiber in your diet with high-fiber foods if you are constipated. You can take an over the counter stool softener like Colace to avoid constipation.  Additional over the counter medications can also be used if Colace isn't sufficient (for example, Milk of Magnesia or Miralax).  Pain The amount of pain is different for each person. Some people need only 1 to 3 doses of pain control medication, while others need more. Take alternating doses  of tylenol  and ibuprofen around the clock for the first five days following surgery.  This will provide a baseline of pain control and help with inflammation.  Take the narcotic pain medication in addition if needed for severe pain.  Contact Your Surgeon at [phone], if you have: Pain in your right upper abdomen like a gallbladder attack. Pain that will not go away Pain that gets worse A fever of more than 101F (38.3C) Repeated vomiting Swelling, redness, bleeding, or bad-smelling  drainage from your wound site Strong abdominal pain No bowel movement or unable to pass gas for 3 days Watery diarrhea lasting longer than 3 days  Pain Control The goal of pain control is to minimize pain, keep you moving and help you heal. Your surgical team will work with you on your pain plan. Most often a combination of therapies and medications are used to control your pain. You may also be given medication (local anesthetic) at the surgical site. This may help control your pain for several days. Extreme pain puts extra stress on your body at a time when your body needs to focus on healing. Do not wait until your pain has reached a level "10" or is unbearable before telling your doctor or nurse. It is much easier to control pain before it becomes severe. Following a laparoscopic procedure, pain is sometimes felt in the shoulder. This is due to the gas inserted into your abdomen during the procedure. Moving and walking helps to decrease the gas and the right shoulder pain.  Use the guide below for ways to manage your post-operative pain. Learn more by going to facs.org/safepaincontrol.  How Intense Is My Pain Common Therapies to Feel Better       I hardly notice my pain, and it does not interfere with my activities.  I notice my pain and it distracts me, but I can still do activities (sitting up, walking, standing).  Non-Medication Therapies  Ice (in a bag, applied over clothing at the surgical site), elevation, rest, meditation, massage, distraction (music, TV, play) walking and mild exercise Splinting the abdomen with pillows +  Non-Opioid Medications Acetaminophen  (Tylenol ) Non-steroidal anti-inflammatory drugs (NSAIDS) Aspirin , Ibuprofen (Motrin, Advil) Naproxen  (Aleve ) Take these as needed, when you feel pain. Both acetaminophen  and NSAIDs help to decrease pain and swelling (inflammation).      My pain is hard to ignore and is more noticeable even when I rest.  My  pain interferes with my usual activities.  Non-Medication Therapies  +  Non-Opioid medications  Take on a regular schedule (around-the-clock) instead of as needed. (For example, Tylenol  every 6 hours at 9:00 am, 3:00 pm, 9:00 pm, 3:00 am and Motrin every 6 hours at 12:00 am, 6:00 am, 12:00 pm, 6:00 pm)         I am focused on my pain, and I am not doing my daily activities.  I am groaning in pain, and I cannot sleep. I am unable to do anything.  My pain is as bad as it could be, and nothing else matters.  Non-Medication Therapies  +  Around-the-Clock Non-Opioid Medications  +  Short-acting opioids  Opioids should be used with other medications to manage severe pain. Opioids block pain and give a feeling of euphoria (feel high). Addiction, a serious side effect of opioids, is rare with short-term (a few days) use.  Examples of short-acting opioids include: Tramadol (Ultram), Hydrocodone  (Norco, Vicodin), Hydromorphone  (Dilaudid ), Oxycodone  (Oxycontin )     The above directions have been  adapted from the [employer] Surgical Patient Education Program.  Please refer to the ACS website if needed: [email or web address].ashx.   Deward Foy, MD [facility] [address], [place], [place]  [address] ?  [address] [address], [place], [place]   [address] [phone] ? [phone] ? FAX [phone] Web site: [email or web address]

## 2023-11-18 NOTE — Anesthesia Procedure Notes (Signed)
 Procedure Name: Intubation Date/Time: 11/18/2023 9:55 AM  Performed by: Thomos Janetta DEL, CRNAPre-anesthesia Checklist: Patient identified, Emergency Drugs available, Suction available and Patient being monitored Patient Re-evaluated:Patient Re-evaluated prior to induction Oxygen Delivery Method: Circle system utilized Preoxygenation: Pre-oxygenation with 100% oxygen Induction Type: IV induction Ventilation: Mask ventilation without difficulty Laryngoscope Size: Mac and 3 Grade View: Grade I Tube type: Oral Tube size: 7.0 mm Number of attempts: 1 Airway Equipment and Method: Stylet Placement Confirmation: ETT inserted through vocal cords under direct vision, positive ETCO2 and breath sounds checked- equal and bilateral Secured at: 23 cm Tube secured with: Tape Dental Injury: Teeth and Oropharynx as per pre-operative assessment

## 2023-11-20 DIAGNOSIS — G301 Alzheimer's disease with late onset: Secondary | ICD-10-CM | POA: Diagnosis not present

## 2023-11-20 DIAGNOSIS — F0280 Dementia in other diseases classified elsewhere without behavioral disturbance: Secondary | ICD-10-CM | POA: Diagnosis not present

## 2023-12-01 DIAGNOSIS — M7918 Myalgia, other site: Secondary | ICD-10-CM | POA: Diagnosis not present

## 2023-12-01 DIAGNOSIS — Z8673 Personal history of transient ischemic attack (TIA), and cerebral infarction without residual deficits: Secondary | ICD-10-CM | POA: Diagnosis not present

## 2023-12-01 DIAGNOSIS — E0821 Diabetes mellitus due to underlying condition with diabetic nephropathy: Secondary | ICD-10-CM | POA: Diagnosis not present

## 2023-12-01 DIAGNOSIS — G301 Alzheimer's disease with late onset: Secondary | ICD-10-CM | POA: Diagnosis not present

## 2023-12-01 DIAGNOSIS — G894 Chronic pain syndrome: Secondary | ICD-10-CM | POA: Diagnosis not present

## 2023-12-01 DIAGNOSIS — M25561 Pain in right knee: Secondary | ICD-10-CM | POA: Diagnosis not present

## 2023-12-10 ENCOUNTER — Telehealth: Payer: Self-pay | Admitting: Cardiology

## 2023-12-10 NOTE — Telephone Encounter (Signed)
 Pt sees a Neurologist every 2 weeks- gets an infusion for Alzheimer's(Leqembi- infusions) . The Neurologist noticed that the patient has a lot of bruising and discoloration on arms and said that the patient does not need to take Xarelto .   The patient wanted to ask Cardiology if he can stop taking the Xarelto .  Informed him that I will send this to the last provider he saw- since Dr Dann retired and they will get back in touch with him with recommendations.

## 2023-12-10 NOTE — Telephone Encounter (Signed)
 Called and left a message- asking for clarification/further information on the bleeding that he mentioned. Left call back number.

## 2023-12-10 NOTE — Telephone Encounter (Signed)
 Pt c/o medication issue:  1. Name of Medication:  XARELTO  20 MG TABS tablet  2. How are you currently taking this medication (dosage and times per day)?  Once daily at night.  3. Are you having a reaction (difficulty breathing--STAT)?   4. What is your medication issue?   Neuroloogist says patient should not be on Xarelto  and advised to follow up with cardiology. Patient mentions that he bleeds often--more often recently due to having procedures.

## 2023-12-14 ENCOUNTER — Encounter: Payer: Self-pay | Admitting: *Deleted

## 2023-12-14 NOTE — Telephone Encounter (Signed)
 Left message for patient to call back

## 2023-12-14 NOTE — Telephone Encounter (Signed)
 Patient was returning call. Please advise ?

## 2023-12-14 NOTE — Telephone Encounter (Signed)
 Left message for pt to call also aware response will be sent to my chart. Once he sees he is to call with questions.

## 2023-12-15 DIAGNOSIS — M7918 Myalgia, other site: Secondary | ICD-10-CM | POA: Diagnosis not present

## 2023-12-15 DIAGNOSIS — Z8673 Personal history of transient ischemic attack (TIA), and cerebral infarction without residual deficits: Secondary | ICD-10-CM | POA: Diagnosis not present

## 2023-12-15 DIAGNOSIS — G301 Alzheimer's disease with late onset: Secondary | ICD-10-CM | POA: Diagnosis not present

## 2023-12-15 DIAGNOSIS — G5603 Carpal tunnel syndrome, bilateral upper limbs: Secondary | ICD-10-CM | POA: Diagnosis not present

## 2023-12-15 DIAGNOSIS — E0821 Diabetes mellitus due to underlying condition with diabetic nephropathy: Secondary | ICD-10-CM | POA: Diagnosis not present

## 2023-12-15 DIAGNOSIS — G894 Chronic pain syndrome: Secondary | ICD-10-CM | POA: Diagnosis not present

## 2023-12-17 DIAGNOSIS — E119 Type 2 diabetes mellitus without complications: Secondary | ICD-10-CM | POA: Diagnosis not present

## 2023-12-17 DIAGNOSIS — H35371 Puckering of macula, right eye: Secondary | ICD-10-CM | POA: Diagnosis not present

## 2023-12-17 DIAGNOSIS — H25813 Combined forms of age-related cataract, bilateral: Secondary | ICD-10-CM | POA: Diagnosis not present

## 2023-12-17 DIAGNOSIS — D3132 Benign neoplasm of left choroid: Secondary | ICD-10-CM | POA: Diagnosis not present

## 2023-12-29 DIAGNOSIS — G5603 Carpal tunnel syndrome, bilateral upper limbs: Secondary | ICD-10-CM | POA: Diagnosis not present

## 2023-12-29 DIAGNOSIS — Z8673 Personal history of transient ischemic attack (TIA), and cerebral infarction without residual deficits: Secondary | ICD-10-CM | POA: Diagnosis not present

## 2023-12-29 DIAGNOSIS — G894 Chronic pain syndrome: Secondary | ICD-10-CM | POA: Diagnosis not present

## 2023-12-29 DIAGNOSIS — G301 Alzheimer's disease with late onset: Secondary | ICD-10-CM | POA: Diagnosis not present

## 2023-12-29 DIAGNOSIS — M7918 Myalgia, other site: Secondary | ICD-10-CM | POA: Diagnosis not present

## 2023-12-29 DIAGNOSIS — E0821 Diabetes mellitus due to underlying condition with diabetic nephropathy: Secondary | ICD-10-CM | POA: Diagnosis not present

## 2024-01-05 ENCOUNTER — Encounter (HOSPITAL_COMMUNITY): Payer: Self-pay

## 2024-01-05 ENCOUNTER — Other Ambulatory Visit: Payer: Self-pay

## 2024-01-05 ENCOUNTER — Emergency Department (HOSPITAL_COMMUNITY)

## 2024-01-05 ENCOUNTER — Emergency Department (HOSPITAL_COMMUNITY)
Admission: EM | Admit: 2024-01-05 | Discharge: 2024-01-05 | Disposition: A | Discharge status: Home / Self Care | Attending: Emergency Medicine | Admitting: Emergency Medicine

## 2024-01-05 DIAGNOSIS — I4891 Unspecified atrial fibrillation: Secondary | ICD-10-CM | POA: Diagnosis not present

## 2024-01-05 DIAGNOSIS — Y9301 Activity, walking, marching and hiking: Secondary | ICD-10-CM | POA: Diagnosis not present

## 2024-01-05 DIAGNOSIS — Y92194 Driveway of other specified residential institution as the place of occurrence of the external cause: Secondary | ICD-10-CM | POA: Diagnosis not present

## 2024-01-05 DIAGNOSIS — Z79899 Other long term (current) drug therapy: Secondary | ICD-10-CM | POA: Insufficient documentation

## 2024-01-05 DIAGNOSIS — S81812A Laceration without foreign body, left lower leg, initial encounter: Secondary | ICD-10-CM | POA: Diagnosis not present

## 2024-01-05 DIAGNOSIS — S8992XA Unspecified injury of left lower leg, initial encounter: Secondary | ICD-10-CM | POA: Diagnosis present

## 2024-01-05 DIAGNOSIS — Z7901 Long term (current) use of anticoagulants: Secondary | ICD-10-CM | POA: Insufficient documentation

## 2024-01-05 DIAGNOSIS — S7002XA Contusion of left hip, initial encounter: Secondary | ICD-10-CM | POA: Diagnosis not present

## 2024-01-05 DIAGNOSIS — W010XXA Fall on same level from slipping, tripping and stumbling without subsequent striking against object, initial encounter: Secondary | ICD-10-CM | POA: Diagnosis not present

## 2024-01-05 DIAGNOSIS — M25552 Pain in left hip: Secondary | ICD-10-CM | POA: Diagnosis not present

## 2024-01-05 DIAGNOSIS — Z043 Encounter for examination and observation following other accident: Secondary | ICD-10-CM | POA: Diagnosis not present

## 2024-01-05 MED ORDER — HYDROCODONE-ACETAMINOPHEN 5-325 MG PO TABS: 1.0000 | ORAL_TABLET | Freq: Four times a day (QID) | ORAL | 0 refills | Status: DC | PRN

## 2024-01-05 MED ORDER — BACITRACIN ZINC 500 UNIT/GM EX OINT
TOPICAL_OINTMENT | Freq: Once | CUTANEOUS | Status: AC
  Administered 2024-01-05: 2 via TOPICAL
  Filled 2024-01-05: qty 1.8

## 2024-01-05 MED ORDER — HYDROCODONE-ACETAMINOPHEN 5-325 MG PO TABS
1.0000 | ORAL_TABLET | Freq: Once | ORAL | Status: AC
  Administered 2024-01-05: 1 via ORAL
  Filled 2024-01-05: qty 1

## 2024-01-05 NOTE — Discharge Instructions (Addendum)
 Evaluation for your left hip pain was overall reassuring.  X-ray was negative for acute injury to the left hip.  I sent a few tablets of Norco to your pharmacy to help with pain.  Also recommend compression socks as well.  Please follow-up with your PCP.  For the skin tear on your posterior calf, recommend that you continue cleaning it daily with soap and water , do not use hydrogen peroxide, applying bacitracin  and a bandage.

## 2024-01-05 NOTE — ED Provider Notes (Signed)
 [place] EMERGENCY DEPARTMENT AT [facility] Provider Note   CSN: [id number] Arrival date & time: 01/05/24  9184     Patient presents with: Hip Pain  HPI Eddie Zimmerman is a 78 y.o. male presenting for mechanical fall that occurred 2 weeks ago.  He states he was walking in his driveway and tripped and fell on the left side of his body primarily landing on his left hip.  Endorses a large bruise to the left lateral hip with pain in that area that radiates down the left leg.  He is ambulatory with a cane which he states that he uses intermittently prior to the fall.  Patient reports that he is on Xarelto  for A-fib.    Hip Pain       Prior to Admission medications   Medication Sig Start Date End Date Taking? Authorizing Provider  HYDROcodone -acetaminophen  (NORCO/VICODIN) 5-325 MG tablet Take 1 tablet by mouth every 6 (six) hours as needed. 01/05/24  Yes Ambrosio Reuter K, PA-C  allopurinol  (ZYLOPRIM ) 100 MG tablet Take 100 mg by mouth every morning.    [provider]  allopurinol  (ZYLOPRIM ) 300 MG tablet Take 300 mg by mouth every morning.    [provider]  amoxicillin (AMOXIL) 500 MG capsule Take 2,000 mg by mouth See admin instructions. Take 4 capsules (2000 mg) by mouth one hour prior to dental procedure 01/01/16   [provider]  Cyanocobalamin (VITAMIN B-12 PO) Take 1 tablet by mouth every morning.    [provider]  cycloSPORINE (RESTASIS) 0.05 % ophthalmic emulsion Place 1 drop into both eyes daily as needed (dry eyes).    [provider]  empagliflozin (JARDIANCE) 10 MG TABS tablet Take 10 mg by mouth daily.    [provider]  escitalopram  (LEXAPRO ) 20 MG tablet Take 20 mg by mouth every morning.    [provider]  hydrochlorothiazide (MICROZIDE) 12.5 MG capsule Take 12.5 mg by mouth every morning.    [provider]  hydroxychloroquine (PLAQUENIL) 200 MG tablet Take 200 mg by mouth 2 (two) times  daily. 12/17/17   [provider]  lisinopril  (ZESTRIL ) 5 MG tablet Take 5 mg by mouth daily. 09/09/21   [provider]  metFORMIN (GLUCOPHAGE) 1000 MG tablet Take 1,000 mg by mouth 2 (two) times daily.    [provider]  Multiple Vitamin (MULTIVITAMIN WITH MINERALS) TABS tablet Take 1 tablet by mouth every morning. Men's One a Day    [provider]  oxyCODONE -acetaminophen  (PERCOCET) 5-325 MG tablet Take 1 tablet by mouth every 4 (four) hours as needed for severe pain (pain score 7-10). 11/18/23 11/17/24  Stechschulte, Deward PARAS, MD  OZEMPIC, 1 MG/DOSE, 4 MG/3ML SOPN Inject 1 mg into the skin once a week. 05/17/23   [provider]  pioglitazone  (ACTOS ) 15 MG tablet Take 15 mg by mouth daily.    [provider]  Polyvinyl Alcohol-Povidone (CLEAR EYES NATURAL TEARS OP) Place 1 drop into both eyes 2 (two) times daily as needed (dry eyes).    [provider]  pregabalin (LYRICA) 200 MG capsule Take 200 mg by mouth in the morning, at noon, and at bedtime. 01/03/21   [provider]  PRESCRIPTION MEDICATION Inhale into the lungs at bedtime. CPAP    [provider]  rosuvastatin  (CRESTOR ) 5 MG tablet Take 1 tablet (5 mg total) by mouth daily. TAKE 1 TABLET DAILY AT 6 P.M 06/30/23   West, Katlyn D, NP  traMADol (  ULTRAM) 50 MG tablet Take 25-50 mg by mouth 3 (three) times daily as needed for severe pain (pain score 7-10). 08/03/21   [provider]  tretinoin (RETIN-A) 0.1 % cream Apply 1 application  topically at bedtime as needed (acne). 09/09/21   [provider]  XARELTO  20 MG TABS tablet TAKE 1 TABLET DAILY 06/17/23   West, Katlyn D, NP    Allergies: Patient has no known allergies.    Review of Systems See HPI  Updated Vital Signs BP 122/73 (BP Location: Right Arm)   Pulse 62   Temp 98.1 F (36.7 C) (Oral)   Resp 18   Ht 5' 10 (1.778 m)   Wt 86.2 kg   SpO2 98%   BMI 27.26 kg/m   Physical  Exam Constitutional:      Appearance: Normal appearance.  HENT:     Head: Normocephalic.     Nose: Nose normal.  Eyes:     Conjunctiva/sclera: Conjunctivae normal.  Pulmonary:     Effort: Pulmonary effort is normal.  Musculoskeletal:     Comments: Hematoma noted to the left lateral hip extending down the left lateral upper leg.  Some bruising noted in the lower leg as well.  Small skin tear noted to the posterior calf.  No palpable fluctuance, surrounding erythema or edema noted.  Neurological:     Mental Status: He is alert.  Psychiatric:        Mood and Affect: Mood normal.     (all labs ordered are listed, but only abnormal results are displayed) Labs Reviewed - No data to display  EKG: None  Radiology: DG HIP UNILAT WITH PELVIS 2-3 VIEWS LEFT Result Date: 01/05/2024 CLINICAL DATA:  [id number] Status post fall 394268 EXAM: DG HIP (WITH OR WITHOUT PELVIS) 2-3V LEFT COMPARISON:  None Available. FINDINGS: No evidence of pelvic fracture or diastasis.No acute hip fracture or dislocation.Mild-to-moderate joint space loss of both hips, likely degenerative. Multilevel degenerative disc disease of the spine.Soft tissues are unremarkable. IMPRESSION: No acute fracture, pelvic bone diastasis, or dislocation. Electronically Signed   By: Rogelia Myers M.D.   On: 01/05/2024 10:00     Procedures   Medications Ordered in the ED  HYDROcodone -acetaminophen  (NORCO/VICODIN) 5-325 MG per tablet 1 tablet (has no administration in time range)  bacitracin  ointment (has no administration in time range)    Clinical Course as of 01/05/24 1015  Tue Jan 05, 2024  1009 BMI (Calculated): 27.26 [JR]    Clinical Course User Index [JR] Lang Norleen POUR, PA-C                                 Medical Decision Making Amount and/or Complexity of Data Reviewed Radiology: ordered.  Risk OTC drugs. Prescription drug management.   78 year old well-appearing male presenting for fall.  Exam notable for  bruising about the left lateral hip.  Patient is ambulatory with a cane which is what he uses normally.  There is some bruising noted in the left lower leg as well.  For the skin tear, cleaned the wound, applied bacitracin  and nonadherent dressing.  X-ray was negative.  Considered occult fracture and possible CT scan but do not feel it is warranted at this time given no obvious deformity left hip reassuring x-ray and patient is ambulatory per his baseline.  Advised compression socks and PCP follow-up.  Sent a few tablets of Norco to his pharmacy.  Discussed return precautions.  Discharged.     Final diagnoses:  Left hip pain    ED Discharge Orders          Ordered    HYDROcodone -acetaminophen  (NORCO/VICODIN) 5-325 MG tablet  Every 6 hours PRN        01/05/24 1015               Elita Dame K, PA-C 01/05/24 1016    Levander Houston, MD 01/06/24 1540

## 2024-01-05 NOTE — ED Triage Notes (Signed)
 Pt had a fall 2 weeks ago and has pain and bruising to left hip. Pt denies hitting his head, reports being on blood thinners. Pt ambulatory with cane.

## 2024-01-07 DIAGNOSIS — N200 Calculus of kidney: Secondary | ICD-10-CM | POA: Diagnosis not present

## 2024-01-07 DIAGNOSIS — N35919 Unspecified urethral stricture, male, unspecified site: Secondary | ICD-10-CM | POA: Diagnosis not present

## 2024-01-07 DIAGNOSIS — R8279 Other abnormal findings on microbiological examination of urine: Secondary | ICD-10-CM | POA: Diagnosis not present

## 2024-01-11 DIAGNOSIS — H25811 Combined forms of age-related cataract, right eye: Secondary | ICD-10-CM | POA: Diagnosis not present

## 2024-01-12 DIAGNOSIS — E0821 Diabetes mellitus due to underlying condition with diabetic nephropathy: Secondary | ICD-10-CM | POA: Diagnosis not present

## 2024-01-12 DIAGNOSIS — Z8673 Personal history of transient ischemic attack (TIA), and cerebral infarction without residual deficits: Secondary | ICD-10-CM | POA: Diagnosis not present

## 2024-01-12 DIAGNOSIS — M7918 Myalgia, other site: Secondary | ICD-10-CM | POA: Diagnosis not present

## 2024-01-12 DIAGNOSIS — G5603 Carpal tunnel syndrome, bilateral upper limbs: Secondary | ICD-10-CM | POA: Diagnosis not present

## 2024-01-12 DIAGNOSIS — G301 Alzheimer's disease with late onset: Secondary | ICD-10-CM | POA: Diagnosis not present

## 2024-01-12 DIAGNOSIS — G894 Chronic pain syndrome: Secondary | ICD-10-CM | POA: Diagnosis not present

## 2024-01-14 DIAGNOSIS — Z6828 Body mass index (BMI) 28.0-28.9, adult: Secondary | ICD-10-CM | POA: Diagnosis not present

## 2024-01-14 DIAGNOSIS — E663 Overweight: Secondary | ICD-10-CM | POA: Diagnosis not present

## 2024-01-14 DIAGNOSIS — M0600 Rheumatoid arthritis without rheumatoid factor, unspecified site: Secondary | ICD-10-CM | POA: Diagnosis not present

## 2024-01-14 DIAGNOSIS — Z79899 Other long term (current) drug therapy: Secondary | ICD-10-CM | POA: Diagnosis not present

## 2024-01-14 DIAGNOSIS — M1991 Primary osteoarthritis, unspecified site: Secondary | ICD-10-CM | POA: Diagnosis not present

## 2024-01-14 DIAGNOSIS — M1009 Idiopathic gout, multiple sites: Secondary | ICD-10-CM | POA: Diagnosis not present

## 2024-02-01 DIAGNOSIS — E7800 Pure hypercholesterolemia, unspecified: Secondary | ICD-10-CM | POA: Diagnosis not present

## 2024-02-01 DIAGNOSIS — M0600 Rheumatoid arthritis without rheumatoid factor, unspecified site: Secondary | ICD-10-CM | POA: Diagnosis not present

## 2024-02-01 DIAGNOSIS — I480 Paroxysmal atrial fibrillation: Secondary | ICD-10-CM | POA: Diagnosis not present

## 2024-02-01 DIAGNOSIS — Z0000 Encounter for general adult medical examination without abnormal findings: Secondary | ICD-10-CM | POA: Diagnosis not present

## 2024-02-01 DIAGNOSIS — Z1331 Encounter for screening for depression: Secondary | ICD-10-CM | POA: Diagnosis not present

## 2024-02-01 DIAGNOSIS — E1169 Type 2 diabetes mellitus with other specified complication: Secondary | ICD-10-CM | POA: Diagnosis not present

## 2024-02-01 DIAGNOSIS — G4730 Sleep apnea, unspecified: Secondary | ICD-10-CM | POA: Diagnosis not present

## 2024-02-01 DIAGNOSIS — D6869 Other thrombophilia: Secondary | ICD-10-CM | POA: Diagnosis not present

## 2024-02-01 DIAGNOSIS — F324 Major depressive disorder, single episode, in partial remission: Secondary | ICD-10-CM | POA: Diagnosis not present

## 2024-02-01 DIAGNOSIS — I10 Essential (primary) hypertension: Secondary | ICD-10-CM | POA: Diagnosis not present

## 2024-02-01 DIAGNOSIS — Z8673 Personal history of transient ischemic attack (TIA), and cerebral infarction without residual deficits: Secondary | ICD-10-CM | POA: Diagnosis not present

## 2024-02-01 DIAGNOSIS — R413 Other amnesia: Secondary | ICD-10-CM | POA: Diagnosis not present

## 2024-02-02 DIAGNOSIS — H26101 Unspecified traumatic cataract, right eye: Secondary | ICD-10-CM | POA: Diagnosis not present

## 2024-02-02 DIAGNOSIS — H25811 Combined forms of age-related cataract, right eye: Secondary | ICD-10-CM | POA: Diagnosis not present

## 2024-02-04 DIAGNOSIS — I4891 Unspecified atrial fibrillation: Secondary | ICD-10-CM | POA: Diagnosis not present

## 2024-02-04 DIAGNOSIS — E1122 Type 2 diabetes mellitus with diabetic chronic kidney disease: Secondary | ICD-10-CM | POA: Diagnosis not present

## 2024-02-04 DIAGNOSIS — F325 Major depressive disorder, single episode, in full remission: Secondary | ICD-10-CM | POA: Diagnosis not present

## 2024-02-04 DIAGNOSIS — D6869 Other thrombophilia: Secondary | ICD-10-CM | POA: Diagnosis not present

## 2024-02-04 DIAGNOSIS — E785 Hyperlipidemia, unspecified: Secondary | ICD-10-CM | POA: Diagnosis not present

## 2024-02-04 DIAGNOSIS — M1990 Unspecified osteoarthritis, unspecified site: Secondary | ICD-10-CM | POA: Diagnosis not present

## 2024-02-04 DIAGNOSIS — Z8249 Family history of ischemic heart disease and other diseases of the circulatory system: Secondary | ICD-10-CM | POA: Diagnosis not present

## 2024-02-04 DIAGNOSIS — E1142 Type 2 diabetes mellitus with diabetic polyneuropathy: Secondary | ICD-10-CM | POA: Diagnosis not present

## 2024-02-04 DIAGNOSIS — N1831 Chronic kidney disease, stage 3a: Secondary | ICD-10-CM | POA: Diagnosis not present

## 2024-02-09 DIAGNOSIS — M7918 Myalgia, other site: Secondary | ICD-10-CM | POA: Diagnosis not present

## 2024-02-09 DIAGNOSIS — G5603 Carpal tunnel syndrome, bilateral upper limbs: Secondary | ICD-10-CM | POA: Diagnosis not present

## 2024-02-09 DIAGNOSIS — Z8673 Personal history of transient ischemic attack (TIA), and cerebral infarction without residual deficits: Secondary | ICD-10-CM | POA: Diagnosis not present

## 2024-02-09 DIAGNOSIS — G301 Alzheimer's disease with late onset: Secondary | ICD-10-CM | POA: Diagnosis not present

## 2024-02-09 DIAGNOSIS — E0821 Diabetes mellitus due to underlying condition with diabetic nephropathy: Secondary | ICD-10-CM | POA: Diagnosis not present

## 2024-02-09 DIAGNOSIS — G894 Chronic pain syndrome: Secondary | ICD-10-CM | POA: Diagnosis not present

## 2024-02-18 ENCOUNTER — Emergency Department (HOSPITAL_COMMUNITY)
Admission: EM | Admit: 2024-02-18 | Discharge: 2024-02-18 | Disposition: A | Discharge status: Home / Self Care | Attending: Emergency Medicine | Admitting: Emergency Medicine

## 2024-02-18 ENCOUNTER — Other Ambulatory Visit: Payer: Self-pay

## 2024-02-18 ENCOUNTER — Emergency Department (HOSPITAL_COMMUNITY)

## 2024-02-18 ENCOUNTER — Encounter (HOSPITAL_COMMUNITY): Payer: Self-pay

## 2024-02-18 DIAGNOSIS — I4891 Unspecified atrial fibrillation: Secondary | ICD-10-CM | POA: Diagnosis not present

## 2024-02-18 DIAGNOSIS — Z7901 Long term (current) use of anticoagulants: Secondary | ICD-10-CM | POA: Diagnosis not present

## 2024-02-18 DIAGNOSIS — M25522 Pain in left elbow: Secondary | ICD-10-CM | POA: Diagnosis present

## 2024-02-18 DIAGNOSIS — Z043 Encounter for examination and observation following other accident: Secondary | ICD-10-CM | POA: Diagnosis not present

## 2024-02-18 DIAGNOSIS — I5033 Acute on chronic diastolic (congestive) heart failure: Secondary | ICD-10-CM | POA: Insufficient documentation

## 2024-02-18 DIAGNOSIS — W010XXA Fall on same level from slipping, tripping and stumbling without subsequent striking against object, initial encounter: Secondary | ICD-10-CM | POA: Insufficient documentation

## 2024-02-18 DIAGNOSIS — S52252A Displaced comminuted fracture of shaft of ulna, left arm, initial encounter for closed fracture: Secondary | ICD-10-CM | POA: Diagnosis not present

## 2024-02-18 DIAGNOSIS — M19022 Primary osteoarthritis, left elbow: Secondary | ICD-10-CM | POA: Diagnosis not present

## 2024-02-18 DIAGNOSIS — E119 Type 2 diabetes mellitus without complications: Secondary | ICD-10-CM | POA: Insufficient documentation

## 2024-02-18 DIAGNOSIS — S52022A Displaced fracture of olecranon process without intraarticular extension of left ulna, initial encounter for closed fracture: Secondary | ICD-10-CM | POA: Insufficient documentation

## 2024-02-18 MED ORDER — IBUPROFEN 200 MG PO TABS
600.0000 mg | ORAL_TABLET | Freq: Once | ORAL | Status: AC
  Administered 2024-02-18: 600 mg via ORAL
  Filled 2024-02-18: qty 3

## 2024-02-18 MED ORDER — BACITRACIN ZINC 500 UNIT/GM EX OINT
TOPICAL_OINTMENT | CUTANEOUS | Status: AC
  Filled 2024-02-18: qty 0.9

## 2024-02-18 MED ORDER — ACETAMINOPHEN 325 MG PO TABS: 650.0000 mg | ORAL_TABLET | Freq: Four times a day (QID) | ORAL | Status: DC | PRN

## 2024-02-18 MED ORDER — OXYCODONE HCL 5 MG PO TABS: 5.0000 mg | ORAL_TABLET | ORAL | 0 refills | Status: DC | PRN

## 2024-02-18 MED ORDER — OXYCODONE HCL 5 MG PO TABS
5.0000 mg | ORAL_TABLET | Freq: Once | ORAL | Status: AC
  Administered 2024-02-18: 5 mg via ORAL
  Filled 2024-02-18: qty 1

## 2024-02-18 NOTE — ED Provider Notes (Signed)
 [place] EMERGENCY DEPARTMENT AT [facility] Provider Note   CSN: [id number] Arrival date & time: 02/18/24  9376     Patient presents with: Eddie Zimmerman is a 78 y.o. male.   Eddie Zimmerman is a 78 year old male with pertinent past medical history of left MCA CVA in 2016, A-fib on Xarelto , HFpEF, and T2DM. Yesterday around 3 pm tripped on the curb, fell onto left side, elbow, and knee. Hit left elbow and knee, very confident he did not hit his head. Has been having worsening left elbow pain and limitation to motion. Was unable to sleep all night, presented this morning to the ED with wife due to uncontrolled pain. Patient falls fairly often, about weekly, due to unsteadiness on his feet. Considering getting a Watchman implant, but currently on Xarelto  for Afib. Reports he bruises easily.   Fall Pertinent negatives include no chest pain, no abdominal pain, no headaches and no shortness of breath.      Prior to Admission medications   Medication Sig Start Date End Date Taking? Authorizing Provider  allopurinol  (ZYLOPRIM ) 100 MG tablet Take 100 mg by mouth every morning.    [provider]  allopurinol  (ZYLOPRIM ) 300 MG tablet Take 300 mg by mouth every morning.    [provider]  amoxicillin (AMOXIL) 500 MG capsule Take 2,000 mg by mouth See admin instructions. Take 4 capsules (2000 mg) by mouth one hour prior to dental procedure 01/01/16   [provider]  Cyanocobalamin (VITAMIN B-12 PO) Take 1 tablet by mouth every morning.    [provider]  cycloSPORINE (RESTASIS) 0.05 % ophthalmic emulsion Place 1 drop into both eyes daily as needed (dry eyes).    [provider]  empagliflozin (JARDIANCE) 10 MG TABS tablet Take 10 mg by mouth daily.    [provider]  escitalopram  (LEXAPRO ) 20 MG tablet Take 20 mg by mouth every morning.    [provider]  hydrochlorothiazide (MICROZIDE) 12.5 MG capsule  Take 12.5 mg by mouth every morning.    [provider]  HYDROcodone -acetaminophen  (NORCO/VICODIN) 5-325 MG tablet Take 1 tablet by mouth every 6 (six) hours as needed. 01/05/24   Robinson, John K, PA-C  hydroxychloroquine (PLAQUENIL) 200 MG tablet Take 200 mg by mouth 2 (two) times daily. 12/17/17   [provider]  lisinopril  (ZESTRIL ) 5 MG tablet Take 5 mg by mouth daily. 09/09/21   [provider]  metFORMIN (GLUCOPHAGE) 1000 MG tablet Take 1,000 mg by mouth 2 (two) times daily.    [provider]  Multiple Vitamin (MULTIVITAMIN WITH MINERALS) TABS tablet Take 1 tablet by mouth every morning. Men's One a Day    [provider]  oxyCODONE -acetaminophen  (PERCOCET) 5-325 MG tablet Take 1 tablet by mouth every 4 (four) hours as needed for severe pain (pain score 7-10). 11/18/23 11/17/24  Stechschulte, Deward PARAS, MD  OZEMPIC, 1 MG/DOSE, 4 MG/3ML SOPN Inject 1 mg into the skin once a week. 05/17/23   [provider]  pioglitazone  (ACTOS ) 15 MG tablet Take 15 mg by mouth daily.    [provider]  Polyvinyl Alcohol-Povidone (CLEAR EYES NATURAL TEARS OP) Place 1 drop into both eyes 2 (two) times daily as needed (dry eyes).    [provider]  pregabalin (LYRICA) 200 MG capsule Take 200 mg by mouth in the morning, at noon, and at bedtime. 01/03/21   [provider]  PRESCRIPTION MEDICATION Inhale into the lungs at bedtime. CPAP  [provider]  rosuvastatin  (CRESTOR ) 5 MG tablet Take 1 tablet (5 mg total) by mouth daily. TAKE 1 TABLET DAILY AT 6 P.M 06/30/23   West, Katlyn D, NP  traMADol (ULTRAM) 50 MG tablet Take 25-50 mg by mouth 3 (three) times daily as needed for severe pain (pain score 7-10). 08/03/21   [provider]  tretinoin (RETIN-A) 0.1 % cream Apply 1 application  topically at bedtime as needed (acne). 09/09/21   [provider]  XARELTO  20 MG TABS tablet TAKE 1 TABLET DAILY 06/17/23   West, Katlyn D,  NP    Allergies: Patient has no known allergies.    Review of Systems  Constitutional:  Negative for chills and fever.  HENT:  Negative for ear pain and sore throat.   Eyes:  Negative for pain and visual disturbance.  Respiratory:  Negative for cough and shortness of breath.   Cardiovascular:  Negative for chest pain and palpitations.  Gastrointestinal:  Negative for abdominal pain and vomiting.  Genitourinary:  Negative for dysuria and hematuria.  Musculoskeletal:  Positive for gait problem and joint swelling. Negative for arthralgias and back pain.  Skin:  Positive for wound. Negative for color change and rash.  Neurological:  Negative for seizures, syncope and headaches.  All other systems reviewed and are negative.   Updated Vital Signs BP 106/75   Pulse 66   Temp 97.8 F (36.6 C) (Oral)   Resp 16   SpO2 96%   Physical Exam Vitals and nursing note reviewed.  Constitutional:      General: He is not in acute distress.    Appearance: He is well-developed.  HENT:     Head: Normocephalic and atraumatic.  Eyes:     Conjunctiva/sclera: Conjunctivae normal.  Cardiovascular:     Rate and Rhythm: Normal rate and regular rhythm.     Heart sounds: No murmur heard. Pulmonary:     Effort: Pulmonary effort is normal. No respiratory distress.     Breath sounds: Normal breath sounds.  Abdominal:     Palpations: Abdomen is soft.     Tenderness: There is no abdominal tenderness.  Musculoskeletal:        General: No swelling.     Right elbow: Normal.     Left elbow: Swelling, effusion and laceration present. No deformity. Decreased range of motion. Tenderness present in olecranon process.     Right hand: Normal.     Left hand: Normal. Normal strength. Normal sensation. There is no disruption of two-point discrimination. Normal capillary refill. Normal pulse.     Cervical back: Neck supple.     Right knee: Normal.     Left knee: Ecchymosis and laceration present. No swelling,  deformity, effusion, erythema, bony tenderness or crepitus. Normal range of motion. No tenderness. No LCL laxity, MCL laxity, ACL laxity or PCL laxity.Normal alignment and normal patellar mobility. Normal pulse.  Skin:    General: Skin is warm and dry.     Capillary Refill: Capillary refill takes less than 2 seconds.  Neurological:     General: No focal deficit present.     Mental Status: He is alert and oriented to person, place, and time. Mental status is at baseline.     Cranial Nerves: No cranial nerve deficit.  Psychiatric:        Mood and Affect: Mood normal.     (all labs ordered are listed, but only abnormal results are displayed) Labs Reviewed - No data to display  EKG:  None  Radiology: DG Elbow Complete Left Result Date: 02/18/2024 EXAM: 3 VIEW(S) XRAY OF THE LEFT ELBOW COMPARISON: None available. CLINICAL HISTORY: fall. Fall; Pt unable to move arm hardly at all. Best images obtainable at this time FINDINGS: LIMITATIONS: Examination is significantly limited due to nonanatomic patient positioning. BONES AND JOINTS: A possible fracture involving the OLECRANON may be present; however, without diagnostic imaging, this cannot be confirmed. Advanced degenerative changes of the ELBOW are noted. No joint dislocation. No joint effusion. SOFT TISSUES: Marked soft tissue swelling of the ELBOW. IMPRESSION: 1. Possible olecranon fracture, study limited by nonanatomic positioning; recommend repeat imaging for confirmation. 2. Marked soft tissue swelling of the elbow. 3. Advanced degenerative changes of the elbow. Electronically signed by: Waddell Calk MD 02/18/2024 07:31 AM EDT RP Workstation: [id number]     Procedures   Medications Ordered in the ED - No data to display  Clinical Course as of 02/18/24 1048  Thu Feb 18, 2024  1043 Dr Germaine ortho [address],  ph# [phone]  [SG]    Clinical Course User Index [SG] Elnor Jayson LABOR, DO                                  Medical Decision Making Nissan Frazzini is a 78 year old male with past medical history of A-fib on Xarelto  and frequent falls presenting after mechanical fall onto the left side with left elbow pain and swelling.  Exam showed significant left elbow edema and effusion with pain to palpation over olecranon and minimal range of motion.  Radiography showed comminuted proximal ulnar fracture at the olecranon with displaced fragments and significant hematoma.  Reassuringly, patient has intact pulses and sensation in left hand distal to fracture.  Left knee with abrasion, but no evidence of effusion and normal range of motion, no concern for fracture or dislocation.  CT head not obtained as patient is confident he did not his head during the fall.  Pain treated with ibuprofen, acetaminophen , oxycodone .  Dr. Elnor consulted orthopedic surgery on-call, Dr. Germaine, who recommended splint and follow up in office tomorrow.  Provided patient with office scheduling information for Dr. Germaine.  Discharged with oxycodone  prescription and strict return precautions ED.  Amount and/or Complexity of Data Reviewed Radiology: ordered and independent interpretation performed. Decision-making details documented in ED Course. ECG/medicine tests: ordered and independent interpretation performed. Decision-making details documented in ED Course.  Risk OTC drugs. Prescription drug management.      Final diagnoses:  None    ED Discharge Orders     None        Chirsty Armistead, MD 02/18/24 1139    Elnor Jayson LABOR, DO 02/18/24 1312

## 2024-02-18 NOTE — Progress Notes (Signed)
 Orthopedic Tech Progress Note Patient Details:  Eddie Zimmerman 1946/03/07 [id number]  Ortho Devices Type of Ortho Device: Shoulder immobilizer, Post (long arm) splint Ortho Device/Splint Location: LUE Ortho Device/Splint Interventions: Ordered, Application, Adjustment   Post Interventions Patient Tolerated: Well Instructions Provided: Care of device, Adjustment of device  Eddie Zimmerman 02/18/2024, 11:29 AM

## 2024-02-18 NOTE — Discharge Instructions (Addendum)
 It was a pleasure caring for you today in the emergency department.  Please call Dr. Germaine office at phone number listed above for appointment tomorrow morning.  He can see you between 830 am to 11 AM tomorrow.  Please return to the emergency department for any worsening or worrisome symptoms.

## 2024-02-18 NOTE — ED Triage Notes (Signed)
 Pt presents via POV c/o mechanical fall yesterday. Reports on Xarelto . Denies hitting head. C/o left elbow pain.

## 2024-02-22 ENCOUNTER — Other Ambulatory Visit: Payer: Self-pay | Admitting: Orthopedic Surgery

## 2024-02-22 ENCOUNTER — Ambulatory Visit
Admission: RE | Admit: 2024-02-22 | Discharge: 2024-02-22 | Disposition: A | Discharge status: Home / Self Care | Source: Ambulatory Visit | Attending: Orthopedic Surgery | Admitting: Orthopedic Surgery

## 2024-02-22 ENCOUNTER — Encounter: Payer: Self-pay | Admitting: Orthopedic Surgery

## 2024-02-22 DIAGNOSIS — S52252A Displaced comminuted fracture of shaft of ulna, left arm, initial encounter for closed fracture: Secondary | ICD-10-CM | POA: Diagnosis not present

## 2024-02-22 DIAGNOSIS — S52022A Displaced fracture of olecranon process without intraarticular extension of left ulna, initial encounter for closed fracture: Secondary | ICD-10-CM

## 2024-02-23 DIAGNOSIS — S52022A Displaced fracture of olecranon process without intraarticular extension of left ulna, initial encounter for closed fracture: Secondary | ICD-10-CM | POA: Diagnosis not present

## 2024-02-24 DIAGNOSIS — G8918 Other acute postprocedural pain: Secondary | ICD-10-CM | POA: Diagnosis not present

## 2024-02-24 DIAGNOSIS — S52022A Displaced fracture of olecranon process without intraarticular extension of left ulna, initial encounter for closed fracture: Secondary | ICD-10-CM | POA: Diagnosis not present

## 2024-02-24 DIAGNOSIS — S52042A Displaced fracture of coronoid process of left ulna, initial encounter for closed fracture: Secondary | ICD-10-CM | POA: Diagnosis not present

## 2024-03-07 DIAGNOSIS — S52022D Displaced fracture of olecranon process without intraarticular extension of left ulna, subsequent encounter for closed fracture with routine healing: Secondary | ICD-10-CM | POA: Diagnosis not present

## 2024-03-07 DIAGNOSIS — Z4889 Encounter for other specified surgical aftercare: Secondary | ICD-10-CM | POA: Diagnosis not present

## 2024-03-10 ENCOUNTER — Emergency Department (HOSPITAL_BASED_OUTPATIENT_CLINIC_OR_DEPARTMENT_OTHER)

## 2024-03-10 ENCOUNTER — Emergency Department (HOSPITAL_BASED_OUTPATIENT_CLINIC_OR_DEPARTMENT_OTHER)
Admission: EM | Admit: 2024-03-10 | Discharge: 2024-03-10 | Disposition: A | Discharge status: Home / Self Care | Attending: Emergency Medicine | Admitting: Emergency Medicine

## 2024-03-10 ENCOUNTER — Other Ambulatory Visit: Payer: Self-pay

## 2024-03-10 DIAGNOSIS — I9589 Other hypotension: Secondary | ICD-10-CM | POA: Insufficient documentation

## 2024-03-10 DIAGNOSIS — J9811 Atelectasis: Secondary | ICD-10-CM | POA: Diagnosis not present

## 2024-03-10 DIAGNOSIS — I10 Essential (primary) hypertension: Secondary | ICD-10-CM | POA: Diagnosis not present

## 2024-03-10 DIAGNOSIS — N3000 Acute cystitis without hematuria: Secondary | ICD-10-CM | POA: Diagnosis not present

## 2024-03-10 DIAGNOSIS — E861 Hypovolemia: Secondary | ICD-10-CM

## 2024-03-10 DIAGNOSIS — Y990 Civilian activity done for income or pay: Secondary | ICD-10-CM | POA: Diagnosis not present

## 2024-03-10 DIAGNOSIS — N281 Cyst of kidney, acquired: Secondary | ICD-10-CM | POA: Diagnosis not present

## 2024-03-10 DIAGNOSIS — G8929 Other chronic pain: Secondary | ICD-10-CM | POA: Diagnosis not present

## 2024-03-10 DIAGNOSIS — M545 Low back pain, unspecified: Secondary | ICD-10-CM

## 2024-03-10 DIAGNOSIS — M546 Pain in thoracic spine: Secondary | ICD-10-CM | POA: Insufficient documentation

## 2024-03-10 DIAGNOSIS — M4696 Unspecified inflammatory spondylopathy, lumbar region: Secondary | ICD-10-CM | POA: Diagnosis not present

## 2024-03-10 DIAGNOSIS — I700 Atherosclerosis of aorta: Secondary | ICD-10-CM | POA: Diagnosis not present

## 2024-03-10 DIAGNOSIS — K5730 Diverticulosis of large intestine without perforation or abscess without bleeding: Secondary | ICD-10-CM | POA: Diagnosis not present

## 2024-03-10 DIAGNOSIS — M549 Dorsalgia, unspecified: Secondary | ICD-10-CM | POA: Diagnosis not present

## 2024-03-10 DIAGNOSIS — I959 Hypotension, unspecified: Secondary | ICD-10-CM | POA: Diagnosis not present

## 2024-03-10 LAB — COMPREHENSIVE METABOLIC PANEL WITH GFR
ALT: 14 U/L (ref 0–44)
AST: 35 U/L (ref 15–41)
Albumin: 4.2 g/dL (ref 3.5–5.0)
Alkaline Phosphatase: 123 U/L (ref 38–126)
Anion gap: 12 (ref 5–15)
BUN: 26 mg/dL — ABNORMAL HIGH (ref 8–23)
CO2: 26 mmol/L (ref 22–32)
Calcium: 9.9 mg/dL (ref 8.9–10.3)
Chloride: 102 mmol/L (ref 98–111)
Creatinine, Ser: 1.51 mg/dL — ABNORMAL HIGH (ref 0.61–1.24)
GFR, Estimated: 48 mL/min — ABNORMAL LOW (ref >60)
Glucose, Bld: 91 mg/dL (ref 70–99)
Potassium: 5 mmol/L (ref 3.5–5.1)
Sodium: 139 mmol/L (ref 135–145)
Total Bilirubin: 0.6 mg/dL (ref 0.0–1.2)
Total Protein: 7.7 g/dL (ref 6.5–8.1)

## 2024-03-10 LAB — URINALYSIS, ROUTINE W REFLEX MICROSCOPIC
Bacteria, UA: NONE SEEN
Bilirubin Urine: NEGATIVE
Glucose, UA: 250 mg/dL — AB
Hgb urine dipstick: NEGATIVE
Ketones, ur: NEGATIVE mg/dL
Nitrite: POSITIVE — AB
Specific Gravity, Urine: 1.032 — ABNORMAL HIGH (ref 1.005–1.030)
WBC, UA: >50 WBC/hpf (ref 0–5)
pH: 6 (ref 5.0–8.0)

## 2024-03-10 LAB — CBC WITH DIFFERENTIAL/PLATELET
Abs Immature Granulocytes: 0.02 K/uL (ref 0.00–0.07)
Basophils Absolute: 0.1 K/uL (ref 0.0–0.1)
Basophils Relative: 1 %
Eosinophils Absolute: 0.4 K/uL (ref 0.0–0.5)
Eosinophils Relative: 5 %
HCT: 39.1 % (ref 39.0–52.0)
Hemoglobin: 12.1 g/dL — ABNORMAL LOW (ref 13.0–17.0)
Immature Granulocytes: 0 %
Lymphocytes Relative: 17 %
Lymphs Abs: 1.4 K/uL (ref 0.7–4.0)
MCH: 29.5 pg (ref 26.0–34.0)
MCHC: 30.9 g/dL (ref 30.0–36.0)
MCV: 95.4 fL (ref 80.0–100.0)
Monocytes Absolute: 0.6 K/uL (ref 0.1–1.0)
Monocytes Relative: 7 %
Neutro Abs: 5.4 K/uL (ref 1.7–7.7)
Neutrophils Relative %: 70 %
Platelets: 328 K/uL (ref 150–400)
RBC: 4.1 MIL/uL — ABNORMAL LOW (ref 4.22–5.81)
RDW: 14.2 % (ref 11.5–15.5)
WBC: 7.8 K/uL (ref 4.0–10.5)
nRBC: 0 % (ref 0.0–0.2)

## 2024-03-10 LAB — CBG MONITORING, ED: Glucose-Capillary: 90 mg/dL (ref 70–99)

## 2024-03-10 LAB — LACTIC ACID, PLASMA: Lactic Acid, Venous: 1.2 mmol/L (ref 0.5–1.9)

## 2024-03-10 MED ORDER — LIDOCAINE 5 % EX PTCH: 1.0000 | MEDICATED_PATCH | CUTANEOUS | 0 refills | Status: AC

## 2024-03-10 MED ORDER — MORPHINE SULFATE (PF) 4 MG/ML IV SOLN
4.0000 mg | Freq: Once | INTRAVENOUS | Status: AC
  Administered 2024-03-10: 4 mg via INTRAVENOUS
  Filled 2024-03-10: qty 1

## 2024-03-10 MED ORDER — LIDOCAINE 5 % EX PTCH
1.0000 | MEDICATED_PATCH | Freq: Once | CUTANEOUS | Status: DC
  Administered 2024-03-10: 1 via TRANSDERMAL
  Filled 2024-03-10: qty 1

## 2024-03-10 MED ORDER — CEPHALEXIN 500 MG PO CAPS: 500.0000 mg | ORAL_CAPSULE | Freq: Four times a day (QID) | ORAL | 0 refills | Status: DC

## 2024-03-10 MED ORDER — DEXAMETHASONE 4 MG PO TABS
6.0000 mg | ORAL_TABLET | Freq: Once | ORAL | Status: AC
  Administered 2024-03-10: 6 mg via ORAL
  Filled 2024-03-10: qty 2

## 2024-03-10 MED ORDER — ACETAMINOPHEN 500 MG PO TABS
1000.0000 mg | ORAL_TABLET | Freq: Once | ORAL | Status: AC
  Administered 2024-03-10: 1000 mg via ORAL
  Filled 2024-03-10: qty 2

## 2024-03-10 MED ORDER — SODIUM CHLORIDE 0.9 % IV BOLUS
1000.0000 mL | Freq: Once | INTRAVENOUS | Status: AC
  Administered 2024-03-10: 1000 mL via INTRAVENOUS

## 2024-03-10 MED ORDER — IOHEXOL 350 MG/ML SOLN
100.0000 mL | Freq: Once | INTRAVENOUS | Status: AC | PRN
  Administered 2024-03-10: 100 mL via INTRAVENOUS

## 2024-03-10 MED ORDER — SODIUM CHLORIDE 0.9 % IV BOLUS
500.0000 mL | Freq: Once | INTRAVENOUS | Status: AC
  Administered 2024-03-10: 500 mL via INTRAVENOUS

## 2024-03-10 MED ORDER — ONDANSETRON HCL 4 MG/2ML IJ SOLN
4.0000 mg | Freq: Once | INTRAMUSCULAR | Status: AC
  Administered 2024-03-10: 4 mg via INTRAVENOUS
  Filled 2024-03-10: qty 2

## 2024-03-10 MED ORDER — METHOCARBAMOL 500 MG PO TABS: 500.0000 mg | ORAL_TABLET | Freq: Two times a day (BID) | ORAL | 0 refills | Status: DC

## 2024-03-10 MED ORDER — METHOCARBAMOL 500 MG PO TABS
500.0000 mg | ORAL_TABLET | Freq: Once | ORAL | Status: AC
  Administered 2024-03-10: 500 mg via ORAL
  Filled 2024-03-10: qty 1

## 2024-03-10 NOTE — ED Notes (Signed)
 Pt ambulated around unit while measuring SpO2. Pt denied any dizziness or SHOB. Pt ambulated with steady gait. Pt SpO2 remained between 88-94% room air while ambulating. EDP notified. Pt placed back on monitor in room.

## 2024-03-10 NOTE — Discharge Instructions (Addendum)
 Please try to increase your fluids and food intake to help with your dehydration.  Please follow-up with your primary doctor regarding the incidental findings we discussed on your CT scan; pancreatic mass, renal cysts.  You do have a urinary tract infection, we are prescribing antibiotics.  As discussed take Tylenol  every 6 hours for baseline pain control you may use the lidocaine  patch and muscle relaxer for further pain relief.  Please use caution as the muscle relaxer can predispose your age group to delirium and falling.  Return for fevers, chills, lightheadedness, passout, chest pain, difficulty breathing, weakness in your lower extremities, numbness in your genital area, bowel or bladder incontinence or any new or worsening symptoms that are concerning to you.

## 2024-03-10 NOTE — ED Notes (Signed)
 Pt d/c instructions, medications, and follow-up care reviewed with pt. Pt verbalized understanding and had no further questions at time of d/c. Pt CA&Ox4, ambulatory, and in NAD at time of d/c. Pt discharged home with wife

## 2024-03-10 NOTE — ED Triage Notes (Signed)
 Patient states back pain that began after working out in the yard yesterday. States he went to [facility] in to get an injection for his back pain but they sent him here due to low BP. BP was 70/51 at [facility]. BP 74/53 in triage. Patient ambulatory to triage.

## 2024-03-10 NOTE — ED Notes (Signed)
 Pt SpO2 intermittently dropping into high 80s. Pt placed on 2L [place]. EDP notified.

## 2024-03-10 NOTE — ED Provider Notes (Signed)
 [place] EMERGENCY DEPARTMENT AT [facility] Provider Note   CSN: [id number] Arrival date & time: 03/10/24  1318     Patient presents with: Hypotension   Eddie Zimmerman is a 78 y.o. male.   78 yo M with a chief send of hypertension.  The patient states that he was doing some yard work yesterday and had developed some low back pain.  Has had chronic back pain that typically got injections but has not needed one in about 3 years or so.  He denies any obvious injury.  When he went to go see his doctor was noticed to have a blood pressure in the 70s.  He was then sent here for further evaluation.  He denies any dark stool or blood in his stool.  He is on Xarelto  for afib.  He has not anything to eat or drink today.  He thought that the pain was too bad to eat.        Prior to Admission medications   Medication Sig Start Date End Date Taking? Authorizing Provider  allopurinol  (ZYLOPRIM ) 100 MG tablet Take 100 mg by mouth every morning.    [provider]  allopurinol  (ZYLOPRIM ) 300 MG tablet Take 300 mg by mouth every morning.    [provider]  amoxicillin (AMOXIL) 500 MG capsule Take 2,000 mg by mouth See admin instructions. Take 4 capsules (2000 mg) by mouth one hour prior to dental procedure 01/01/16   [provider]  Cyanocobalamin (VITAMIN B-12 PO) Take 1 tablet by mouth every morning.    [provider]  cycloSPORINE (RESTASIS) 0.05 % ophthalmic emulsion Place 1 drop into both eyes daily as needed (dry eyes).    [provider]  empagliflozin (JARDIANCE) 10 MG TABS tablet Take 10 mg by mouth daily.    [provider]  escitalopram  (LEXAPRO ) 20 MG tablet Take 20 mg by mouth every morning.    [provider]  hydrochlorothiazide (MICROZIDE) 12.5 MG capsule Take 12.5 mg by mouth every morning.    [provider]  hydroxychloroquine (PLAQUENIL) 200 MG tablet Take 200 mg by mouth 2 (two) times daily.  12/17/17   [provider]  lisinopril  (ZESTRIL ) 5 MG tablet Take 5 mg by mouth daily. 09/09/21   [provider]  metFORMIN (GLUCOPHAGE) 1000 MG tablet Take 1,000 mg by mouth 2 (two) times daily.    [provider]  Multiple Vitamin (MULTIVITAMIN WITH MINERALS) TABS tablet Take 1 tablet by mouth every morning. Men's One a Day    [provider]  oxyCODONE  (ROXICODONE ) 5 MG immediate release tablet Take 1 tablet (5 mg total) by mouth every 4 (four) hours as needed for severe pain (pain score 7-10). 02/18/24   Elnor Jayson LABOR, DO  OZEMPIC, 1 MG/DOSE, 4 MG/3ML SOPN Inject 1 mg into the skin once a week. 05/17/23   [provider]  pioglitazone  (ACTOS ) 15 MG tablet Take 15 mg by mouth daily.    [provider]  Polyvinyl Alcohol-Povidone (CLEAR EYES NATURAL TEARS OP) Place 1 drop into both eyes 2 (two) times daily as needed (dry eyes).    [provider]  pregabalin (LYRICA) 200 MG capsule Take 200 mg by mouth in the morning, at noon, and at bedtime. 01/03/21   [provider]  PRESCRIPTION MEDICATION Inhale into the lungs at bedtime. CPAP    [provider]  rosuvastatin  (CRESTOR ) 5 MG tablet Take 1 tablet (5 mg total) by mouth daily. TAKE  1 TABLET DAILY AT 6 P.M 06/30/23   West, Katlyn D, NP  traMADol (ULTRAM) 50 MG tablet Take 25-50 mg by mouth 3 (three) times daily as needed for severe pain (pain score 7-10). 08/03/21   [provider]  tretinoin (RETIN-A) 0.1 % cream Apply 1 application  topically at bedtime as needed (acne). 09/09/21   [provider]  XARELTO  20 MG TABS tablet TAKE 1 TABLET DAILY 06/17/23   West, Katlyn D, NP    Allergies: Patient has no known allergies.    Review of Systems  Updated Vital Signs BP 136/82   Pulse (!) 52   Temp 97.6 F (36.4 C) (Oral)   Resp 15   SpO2 94%   Physical Exam Vitals and nursing note reviewed.  Constitutional:      Appearance: He is well-developed.   HENT:     Head: Normocephalic and atraumatic.     Comments: Pale conjunctiva Eyes:     Pupils: Pupils are equal, round, and reactive to light.  Neck:     Vascular: No JVD.  Cardiovascular:     Rate and Rhythm: Normal rate and regular rhythm.     Heart sounds: No murmur heard.    No friction rub. No gallop.  Pulmonary:     Effort: No respiratory distress.     Breath sounds: No wheezing.  Abdominal:     General: There is no distension.     Tenderness: There is no abdominal tenderness. There is no guarding or rebound.  Musculoskeletal:        General: Normal range of motion.     Cervical back: Normal range of motion and neck supple.     Comments: Patient points to his mid low back as area of discomfort.  No obvious midline spinal tenderness step-offs or deformities.  Pulse motor and sensation are intact to bilateral lower extremities.  Reflexes are 2+ and equal.  Skin:    Coloration: Skin is not pale.     Findings: No rash.  Neurological:     Mental Status: He is alert and oriented to person, place, and time.  Psychiatric:        Behavior: Behavior normal.     (all labs ordered are listed, but only abnormal results are displayed) Labs Reviewed  CBC WITH DIFFERENTIAL/PLATELET - Abnormal; Notable for the following components:      Result Value   RBC 4.10 (*)    Hemoglobin 12.1 (*)    All other components within normal limits  COMPREHENSIVE METABOLIC PANEL WITH GFR - Abnormal; Notable for the following components:   BUN 26 (*)    Creatinine, Ser 1.51 (*)    GFR, Estimated 48 (*)    All other components within normal limits  CULTURE, BLOOD (ROUTINE X 2)  CULTURE, BLOOD (ROUTINE X 2)  LACTIC ACID, PLASMA  URINALYSIS, ROUTINE W REFLEX MICROSCOPIC  CBG MONITORING, ED    EKG: None  Radiology: [facility] Chest Port 1 View Result Date: 03/10/2024 CLINICAL DATA:  Hypotension. EXAM: PORTABLE CHEST 1 VIEW COMPARISON:  Chest radiograph dated 04/03/2022. FINDINGS: Right-sided  Port-A-Cath with tip at the cavoatrial junction. There are bibasilar atelectasis. No focal consolidation, pleural effusion or pneumothorax. Stable cardiac silhouette. No acute osseous pathology. IMPRESSION: Bibasilar atelectasis. No focal consolidation. Electronically Signed   By: Vanetta Chou M.D.   On: 03/10/2024 14:50     .Critical Care  Performed by: Emil Share, DO Authorized by: Emil Share, DO   Critical care provider statement:  Critical care time (minutes):  35   Critical care time was exclusive of:  Separately billable procedures and treating other patients   Critical care was time spent personally by me on the following activities:  Development of treatment plan with patient or surrogate, discussions with consultants, evaluation of patient's response to treatment, examination of patient, ordering and review of laboratory studies, ordering and review of radiographic studies, ordering and performing treatments and interventions, pulse oximetry, re-evaluation of patient's condition and review of old charts   Care discussed with: admitting provider      Medications Ordered in the ED  iohexol  (OMNIPAQUE ) 350 MG/ML injection 100 mL (has no administration in time range)  morphine (PF) 4 MG/ML injection 4 mg (has no administration in time range)  ondansetron  (ZOFRAN ) injection 4 mg (has no administration in time range)  sodium chloride  0.9 % bolus 1,000 mL (0 mLs Intravenous Stopped 03/10/24 1459)    Clinical Course as of 03/10/24 1502  Thu Mar 10, 2024  1457 Received signout; pending CT scans.  [TY]    Clinical Course User Index [TY] Neysa Caron PARAS, DO                                 Medical Decision Making Amount and/or Complexity of Data Reviewed Labs: ordered. Radiology: ordered. ECG/medicine tests: ordered.  Risk Prescription drug management.   78 yo M with a chief complaints of low blood pressure.  Patient had developed low back pain after working in the yard  yesterday.  He went to see his family doctor for this and was found to be hypertensive.  Sent here for evaluation.  Will obtain laboratory evaluation.  Bolus of IV fluids.  Patient's blood pressure improved with IV fluids.  Lactate normal.  No obvious significant change to his hemoglobin.  Mild AKI.  Will obtain CT dissection study. \\  Signed out to Dr. Neysa, please see their note for further detials of care in the ED.  The patients results and plan were reviewed and discussed.   Any x-rays performed were independently reviewed by myself.   Differential diagnosis were considered with the presenting HPI.  Medications  iohexol  (OMNIPAQUE ) 350 MG/ML injection 100 mL (has no administration in time range)  morphine (PF) 4 MG/ML injection 4 mg (has no administration in time range)  ondansetron  (ZOFRAN ) injection 4 mg (has no administration in time range)  sodium chloride  0.9 % bolus 1,000 mL (0 mLs Intravenous Stopped 03/10/24 1459)    Vitals:   03/10/24 1400 03/10/24 1415 03/10/24 1430 03/10/24 1445  BP: (!) 90/57 91/64 (!) 116/58 136/82  Pulse:  (!) 51 (!) 50 (!) 52  Resp: 10 12 11 15   Temp:      TempSrc:      SpO2:  92% 98% 94%    Final diagnoses:  Hypotension due to hypovolemia  Acute midline low back pain without sciatica        Final diagnoses:  Hypotension due to hypovolemia  Acute midline low back pain without sciatica    ED Discharge Orders     None          Emil Share, DO 03/10/24 1502

## 2024-03-10 NOTE — ED Provider Notes (Signed)
 Clinical Course as of 03/10/24 1751  Thu Mar 10, 2024  1457 Received signout; pending CT scans.  [TY]  1703 CT Angio Chest/Abd/Pel for Dissection W and/or Wo Contrast IMPRESSION: 1. Negative for acute aortic dissection or aneurysm. 2. Small hiatal hernia with distal esophageal thickening, possible esophagitis. This may be correlated with endoscopy as indicated. 3. Slightly thick-walled bladder with mild perivesical stranding, correlate for cystitis. 4. 2 cm cystic density at the posterior pancreatic head, previously 2 cm. Reference MRI 02/08/2023 for additional follow-up recommendation 5. Multiple bilateral simple and complex renal cysts and subcentimeter hypodensities too small to further characterize, no specific imaging follow-up recommended, previously evaluated on multiple MRI. 6. Mild diverticular disease of the colon without acute inflammatory process. 7. Aortic atherosclerosis.   [TY]  1704 CT L-SPINE NO CHARGE IMPRESSION: 1. No CT evidence for acute osseous abnormality. 2. Multilevel degenerative changes as above, most significant at L3-L4 and L4-L5.   [TY]  1724 Patient blood pressure improved.  Made aware of incidental findings on CTA.  Is requesting more pain medications for his low back.  At this point suspect MSK etiology exam reassuring.  Will give Tylenol , lidocaine , muscle relaxer and Decadron .  Nurses also instructed to ambulate patient. [TY]  1750 Ambulatory without distress.  Will discharge in stable condition.  Return precautions given.  Patient will be given lidocaine  patch and muscle relaxer for his back pain.  Will be given antibiotics for UTI. [TY]    Clinical Course User Index [TY] Neysa Caron PARAS, DO      Neysa Caron PARAS, DO 03/10/24 1751

## 2024-03-11 DIAGNOSIS — E861 Hypovolemia: Secondary | ICD-10-CM | POA: Diagnosis not present

## 2024-03-14 DIAGNOSIS — M25522 Pain in left elbow: Secondary | ICD-10-CM | POA: Diagnosis not present

## 2024-03-14 DIAGNOSIS — M25622 Stiffness of left elbow, not elsewhere classified: Secondary | ICD-10-CM | POA: Diagnosis not present

## 2024-03-15 LAB — CULTURE, BLOOD (ROUTINE X 2)
Culture: NO GROWTH
Special Requests: ADEQUATE

## 2024-03-16 DIAGNOSIS — M25522 Pain in left elbow: Secondary | ICD-10-CM | POA: Diagnosis not present

## 2024-03-16 DIAGNOSIS — M25622 Stiffness of left elbow, not elsewhere classified: Secondary | ICD-10-CM | POA: Diagnosis not present

## 2024-03-16 DIAGNOSIS — M533 Sacrococcygeal disorders, not elsewhere classified: Secondary | ICD-10-CM | POA: Diagnosis not present

## 2024-03-16 LAB — CULTURE, BLOOD (ROUTINE X 2)
Culture  Setup Time: NO GROWTH
Special Requests: ADEQUATE

## 2024-03-17 ENCOUNTER — Ambulatory Visit
Attending: Student in an Organized Health Care Education/Training Program | Admitting: Student in an Organized Health Care Education/Training Program

## 2024-03-17 ENCOUNTER — Encounter: Payer: Self-pay | Admitting: Student in an Organized Health Care Education/Training Program

## 2024-03-17 ENCOUNTER — Telehealth (HOSPITAL_BASED_OUTPATIENT_CLINIC_OR_DEPARTMENT_OTHER): Payer: Self-pay | Admitting: *Deleted

## 2024-03-17 VITALS — BP 98/60 | HR 70 | Ht 70.0 in | Wt 195.0 lb

## 2024-03-17 DIAGNOSIS — I480 Paroxysmal atrial fibrillation: Secondary | ICD-10-CM | POA: Diagnosis not present

## 2024-03-17 DIAGNOSIS — Z01812 Encounter for preprocedural laboratory examination: Secondary | ICD-10-CM

## 2024-03-17 LAB — CBC

## 2024-03-17 NOTE — Telephone Encounter (Signed)
 Post ED Visit - Positive Culture Follow-up  Culture report reviewed by antimicrobial stewardship pharmacist: [facility] [x]  [place], [place].D. []  Venetia Gully, Pharm.D., BCPS AQ-ID []  Garrel Crews, Pharm.D., BCPS []  Almarie Lunger, Pharm.D., BCPS []  Alligator, [address].D., BCPS, AAHIVP []  Rosaline Bihari, Pharm.D., BCPS, AAHIVP []  Vernell Meier, PharmD, BCPS []  Latanya Hint, PharmD, BCPS []  Donald Medley, PharmD, BCPS []  Rocky Bold, PharmD []  Dorothyann Alert, PharmD, BCPS []  Morene Babe, PharmD  [facility] []  Rosaline Edison, PharmD []  Romona Bliss, PharmD []  Dolphus Roller, PharmD []  Veva Seip, Rph []  Vernell Daunt) Leonce, PharmD []  Eva Allis, PharmD []  Rosaline Millet, PharmD []  Iantha Batch, PharmD []  Arvin Gauss, PharmD []  Wanda Hasting, PharmD []  Ronal Rav, PharmD []  Rocky Slade, PharmD []  Bard Jeans, PharmD   Positive blood culture Treated with cephalexin .  Per Darice Showers, PA-C, pt not noted to be septic in ED (LA normal, well appearing on exam), found to have hypotension 2/2 hypovolemia and tx for UTI w/ results from CTA (no urinary sx noted).  Organism typical of skin microbiome, not usually pathogenic, no increased risk factors noted to have immunosuppression.  No further patient follow-up is required at this time.  Lorita Barnie Pereyra 03/17/2024, 11:16 AM

## 2024-03-17 NOTE — Patient Instructions (Addendum)
 Medication Instructions:  Your physician recommends that you continue on your current medications as directed. Please refer to the Current Medication list given to you today.  *If you need a refill on your cardiac medications before your next appointment, please call your pharmacy*  Lab Work: CBC and BMET today If you have labs (blood work) drawn today and your tests are completely normal, you will receive your results only by: MyChart Message (if you have MyChart) OR A paper copy in the mail If you have any lab test that is abnormal or we need to change your treatment, we will call you to review the results.  Testing/Procedures: Cardiac CT Your physician has requested that you have cardiac CT. Cardiac computed tomography (CT) is a painless test that uses an x-ray machine to take clear, detailed pictures of your heart. For further information please visit [email or web address] Please follow instruction sheet as given. You will be called to schedule this test.   Watchman will be scheduled for 04/14/2024. Your physician has requested that you have Left atrial appendage (LAA) closure device implantation is a procedure to put a small device in the LAA of the heart. The LAA is a small sac in the wall of the heart's left upper chamber. Blood clots can form in this area. The device, Watchman closes the LAA to help prevent a blood clot and stroke.  You will be contacted by Nurse Navigator, Danielle to schedule your pre-procedure visit and procedure date. If you have any questions she can be reached at [phone].   Follow-Up: At [facility], you and your health needs are our priority.  As part of our continuing mission to provide you with exceptional heart care, our providers are all part of one team.  This team includes your primary Cardiologist (physician) and Advanced Practice Providers or APPs (Physician Assistants and Nurse Practitioners) who all work together to provide you with the  care you need, when you need it.   WATCHMAN CT INSTRUCTIONS: Your WATCHMAN CT will be scheduled at the [facility] ([address]). You will be called to confirm appointment date and time.  The day of your CT appointment, please use the FREE valet parking offered at the [facility] entrance (encouraged to control the heart rate for the test). You will check in on the first floor then proceed to the second floor to complete the registration process and for CT scanning.   Please follow these instructions carefully:  Hold all erectile dysfunction medications at least 3 days (72 hrs) prior to test.  On the Night Before the Test: Be sure to Drink plenty of water . Do not consume any caffeinated/decaffeinated beverages or chocolate 12 hours prior to your test. Do not take any antihistamines 12 hours prior to your test.    On the Day of the Test: Drink plenty of water , but do not eat or drink 1 hour prior to your scheduled CT appointment time. You may take your regular medications prior to the test.  Take metoprolol (Lopressor) two hours prior to test  HOLD Furosemide/Hydrochlorothiazide morning of the test. Please wear underwire-free bra if available      After the Test: Drink plenty of water . After receiving IV contrast, you may experience a mild flushed feeling. This is normal. On occasion, you may experience a mild rash up to 24 hours after the test. This is not dangerous. If this occurs, you can take Benadryl  25 mg and increase your  fluid intake. If you experience trouble breathing, this can be serious. If it is severe call 911 IMMEDIATELY. If it is mild, please call our office. If you take any of these medications: Glipizide/Metformin, Avandament, Glucavance, please do not take 48 hours after completing test unless otherwise instructed.  Once we have confirmed authorization from your insurance company, you will be called to set up a date  and time for your test.   For non-scheduling related questions/concerns about your CT scan, please contact the cardiac imaging nurses: Camie Shutter, Cardiac Imaging Nurse Navigator Chantal Requena, Cardiac Imaging Nurse Navigator [facility] Direct Office Dial: [phone]   For scheduling needs, including cancellations and rescheduling, please call Brittany, [phone].  The Watchman Nurse Navigator will contact you after your CT to discuss results and schedule your procedure.    Thank you!

## 2024-03-17 NOTE — Progress Notes (Signed)
 Cardiology Office Note   Date: 03/17/24 ID:  Eddie Zimmerman, DOB Jul 04, 1945, MRN [id number] PCP: Rexanne Ingle, MD  [facility] Providers Cardiologist:  Candyce Reek, MD Electrophysiologist:  [name], MD   History of Present Illness Eddie Zimmerman is a 78 y.o. male with paroxysmal AF, cryptogenic CVA (2016) s/p ILR with identification of AF with subsequent removal (2018), coronary calcifications, DM2, HTN, HLD, OSA, and obesity s/p lap band surgery who presents for AF management and associated stroke risk. History of Present Illness Eddie Zimmerman is a 78 year old male with atrial fibrillation who presents for evaluation of stroke risk management. He is accompanied by his wife, Eddie Zimmerman.  He has a history of atrial fibrillation, identified following a stroke that occurred after knee replacement surgery. A loop recorder confirmed the presence of atrial fibrillation, although the burden is reportedly low. He is currently on Xarelto  to manage stroke risk but experiences significant bruising as a side effect. He is interested in exploring alternative treatments to manage his condition without long-term anticoagulation.  He has a history of low blood pressure, which was notably low during a recent emergency room visit for back pain, where he was found to be dehydrated. His blood pressure improved after receiving fluids. He is on lisinopril  and hydrochlorothiazide for blood pressure management, but there is consideration of adjusting these medications due to low blood pressure readings.  He has diabetes and is on Jardiance for management. He also has neuropathy in his feet and has had both knees replaced, contributing to balance issues and frequent falls.  He experiences bruising from Xarelto  and has low blood pressure readings. He has a history of falls, likely due to neuropathy and knee replacements.  Today he presents to clinic for further evaluation of AF and stroke  risk reduction.  He would ideally like to discontinue Xarelto  as he has constant bruising and falls related to his neuropathy.  ROS: bruising, falls   Studies Reviewed  ECG review  03/17/24: NSR 68, PR 178, QRS 96, QT/c 392/416 06/25/23: SB 57, PR 230, QRS 96, QT/c 440/428   Risk Assessment/Calculations  CHA2DS2-VASc Score = 8  This indicates a 10.8% annual risk of stroke. The patient's score is based upon: CHF History: 1 HTN History: 1 Diabetes History: 1 Stroke History: 2 Vascular Disease History: 1 Age Score: 2 Gender Score: 0  Physical Exam VS:  BP 98/60 (BP Location: Left Arm, Patient Position: Sitting, Cuff Size: Large)   Pulse 70   Ht 5' 10 (1.778 m)   Wt 195 lb (88.5 kg)   SpO2 96%   BMI 27.98 kg/m       Wt Readings from Last 3 Encounters:  03/17/24 195 lb (88.5 kg)  01/05/24 190 lb (86.2 kg)  11/18/23 192 lb (87.1 kg)    GEN: Well nourished, well developed in no acute distress CARDIAC: RRR, no murmurs, rubs, gallops RESPIRATORY:  Clear to auscultation without rales, wheezing or rhonchi  EXTREMITIES:  No edema; No deformity   ASSESSMENT AND PLAN STONE SPIRITO is a 78 y.o. male with paroxysmal AF, cryptogenic CVA (2016) s/p ILR with identification of AF with subsequent removal (2018), coronary calcifications, DM2, HTN, HLD, OSA, and obesity s/p lap band surgery who presents for AF management and associated stroke risk.  Paroxysmal AF Stroke risk reduction  Mr. Shorten has not had any recurrent symptomatic AF episodes although he did not have rhythm awareness prior to his stroke or while his ILR was  implanted.  He only had brief episodes of atrial fibrillation when he had his ILR prior to explant and has had no sustained episodes of AF since explant.  With his extremely low burden of AF he is not interested in rhythm control options at this point which I think is reasonable as he is asymptomatic and has not had increasing burden.  He really would like to be off  of long-term OAC however his CHA2DS2-VASc of 8 I would recommend continuation of OAC or alternative stroke risk reduction with watchman implant.  I have seen Dempsey LITTIE Mackintosh in the office today who is being considered for a Watchman left atrial appendage closure device. I believe they will benefit from this procedure given their history of atrial fibrillation, CHA2DS2-VASc score of 8 and unadjusted ischemic stroke rate of 10.8% per year. Unfortunately, the patient is not felt to be a long term anticoagulation candidate secondary to bruising and recurrent falls as well as patient preference. The patient's chart has been reviewed and I feel that they would be a candidate for short term oral anticoagulation after Watchman implant.   It is my belief that after undergoing a LAA closure procedure, Dempsey LITTIE Mackintosh will not need long term anticoagulation which eliminates anticoagulation side effects and major bleeding risk.   Procedural risks for the Watchman implant have been reviewed with the patient including a 0.5% risk of stroke, <1% risk of perforation and <1% risk of device embolization. Other risks include bleeding, vascular damage, tamponade, worsening renal function, and death. The patient understands these risk and wishes to proceed.    The published clinical data on the safety and effectiveness of WATCHMAN include but are not limited to the following: - Holmes DR, Jess BEARD, Sick P et al. for the PROTECT AF Investigators. Percutaneous closure of the left atrial appendage versus warfarin therapy for prevention of stroke in patients with atrial fibrillation: a randomised non-inferiority trial. Lancet 2009; 374: 534-42. GLENWOOD Jess BEARD, Doshi SK, Jonita VEAR Satchel D et al. on behalf of the PROTECT AF Investigators. Percutaneous Left Atrial Appendage Closure for Stroke Prophylaxis in Patients With Atrial Fibrillation 2.3-Year Follow-up of the PROTECT AF (Watchman Left Atrial Appendage System for Embolic Protection  in Patients With Atrial Fibrillation) Trial. Circulation 2013; 127:720-729. - Alli O, Doshi S,  Kar S, Reddy VY, Sievert H et al. Quality of Life Assessment in the Randomized PROTECT AF (Percutaneous Closure of the Left Atrial Appendage Versus Warfarin Therapy for Prevention of Stroke in Patients With Atrial Fibrillation) Trial of Patients at Risk for Stroke With Nonvalvular Atrial Fibrillation. J Am Coll Cardiol 2013; 61:1790-8. GLENWOOD Satchel DR, Archer RAMAN, Price M, Whisenant B, Sievert H, Doshi S, Huber K, Reddy V. Prospective randomized evaluation of the Watchman left atrial appendage Device in patients with atrial fibrillation versus long-term warfarin therapy; the PREVAIL trial. Journal of the [employer] of Cardiology, Vol. 4, No. 1, 2014, 1-11. - Kar S, Doshi SK, Sadhu A, Horton R, Osorio J et al. Primary outcome evaluation of a next-generation left atrial appendage closure device: results from the PINNACLE FLX trial. Circulation 2021;143(18)1754-1762.   After today's visit with the patient which was dedicated solely for shared decision making visit regarding LAA closure device, the patient decided to proceed with the LAA appendage closure procedure scheduled to be done in the near future at [facility].  Prior to the procedure, I would like to obtain a gated CT scan of the chest with contrast timed for PV/LA visualization.  HAS-BLED score 2 Hypertension No  Abnormal renal and liver function (Dialysis, transplant, Cr >2.26 mg/dL /Cirrhosis or Bilirubin >2x Normal or AST/ALT/AP >3x Normal) No  Stroke Yes  Bleeding No  Labile INR (Unstable/high INR) No  Elderly (>65) Yes  Drugs or alcohol (>= 8 drinks/week, anti-plt or NSAID) No   CHA2DS2-VASc Score = 8  The patient's score is based upon: CHF History: 1 HTN History: 1 Diabetes History: 1 Stroke History: 2 Vascular Disease History: 1 Age Score: 2 Gender Score: 0  Procedure details Procedure date: 04/14/24 Procedure plan:  Watchman FLX Pro implant  GA/TEE Pre-procedural CT: pending/ordered Pre-procedural medications: Continue uninterrupted OAC (LD Xarelto  12/17 PM), hold all 12/18 AM medications   A total of 50  minutes was spent preparing for the patient, reviewing history, performing exam, document encounter, coordinating care and counseling the patient. 28 minutes was spent with direct patient care.   Dispo: RTC post Watchman implant   Signed, [name], MD

## 2024-03-17 NOTE — Progress Notes (Deleted)
  Cardiology Office Note   Date:  03/17/2024  ID:  Eddie Zimmerman, DOB 1946-01-25, MRN [id number] PCP: Rexanne Ingle, MD  [facility] Providers Cardiologist:  Candyce Reek, MD { Click to update primary MD,subspecialty MD or APP then REFRESH:1}    History of Present Illness Eddie Zimmerman is a 78 y.o. male ***  ROS: ***  Studies Reviewed      *** Risk Assessment/Calculations {Does this patient have ATRIAL FIBRILLATION?:[phone]}         Physical Exam VS:  BP 98/60 (BP Location: Left Arm, Patient Position: Sitting, Cuff Size: Large)   Pulse 70   Ht 5' 10 (1.778 m)   Wt 195 lb (88.5 kg)   SpO2 96%   BMI 27.98 kg/m        Wt Readings from Last 3 Encounters:  03/17/24 195 lb (88.5 kg)  01/05/24 190 lb (86.2 kg)  11/18/23 192 lb (87.1 kg)    GEN: Well nourished, well developed in no acute distress NECK: No JVD; No carotid bruits CARDIAC: ***RRR, no murmurs, rubs, gallops RESPIRATORY:  Clear to auscultation without rales, wheezing or rhonchi  ABDOMEN: Soft, non-tender, non-distended EXTREMITIES:  No edema; No deformity   ASSESSMENT AND PLAN ***    {Are you ordering a CV Procedure (e.g. stress test, cath, DCCV, TEE, etc)?   Press F2        :[id number]}  Dispo: ***  Signed, [name], MD

## 2024-03-18 DIAGNOSIS — N200 Calculus of kidney: Secondary | ICD-10-CM | POA: Diagnosis not present

## 2024-03-18 DIAGNOSIS — N35812 Other urethral bulbous stricture, male: Secondary | ICD-10-CM | POA: Diagnosis not present

## 2024-03-18 LAB — CBC
Hematocrit: 37.2 % — ABNORMAL LOW (ref 37.5–51.0)
Hemoglobin: 11.8 g/dL — ABNORMAL LOW (ref 13.0–17.7)
MCH: 29.5 pg (ref 26.6–33.0)
MCHC: 31.7 g/dL (ref 31.5–35.7)
MCV: 93 fL (ref 79–97)
Platelets: 246 x10E3/uL (ref 150–450)
RBC: 4 x10E6/uL — ABNORMAL LOW (ref 4.14–5.80)
RDW: 13.6 % (ref 11.6–15.4)
WBC: 10.2 x10E3/uL (ref 3.4–10.8)

## 2024-03-18 LAB — BASIC METABOLIC PANEL WITH GFR
BUN/Creatinine Ratio: 16 (ref 10–24)
BUN: 22 mg/dL (ref 8–27)
CO2: 24 mmol/L (ref 20–29)
Calcium: 9.4 mg/dL (ref 8.6–10.2)
Chloride: 104 mmol/L (ref 96–106)
Creatinine, Ser: 1.37 mg/dL — ABNORMAL HIGH (ref 0.76–1.27)
Glucose: 117 mg/dL — ABNORMAL HIGH (ref 70–99)
Potassium: 4.7 mmol/L (ref 3.5–5.2)
Sodium: 141 mmol/L (ref 134–144)
eGFR: 53 mL/min/1.73 — ABNORMAL LOW (ref >59)

## 2024-03-21 ENCOUNTER — Telehealth: Payer: Self-pay

## 2024-03-21 DIAGNOSIS — M25622 Stiffness of left elbow, not elsewhere classified: Secondary | ICD-10-CM | POA: Diagnosis not present

## 2024-03-21 DIAGNOSIS — I959 Hypotension, unspecified: Secondary | ICD-10-CM | POA: Diagnosis not present

## 2024-03-21 DIAGNOSIS — M25522 Pain in left elbow: Secondary | ICD-10-CM | POA: Diagnosis not present

## 2024-03-21 NOTE — Telephone Encounter (Signed)
 Left voicemail for patient to return call to see if he can move up his CT scan date.

## 2024-03-21 NOTE — Telephone Encounter (Signed)
 Left voicemail for Dr. Christiane CMA, Darlene to return call to discuss Watchman referral form that needs to be completed.

## 2024-03-22 NOTE — Telephone Encounter (Addendum)
 Spoke with patient. Moved up CT scan 11/28 at 9:30. Updated instructions will be sent via MyChart.

## 2024-03-22 NOTE — Telephone Encounter (Signed)
 Did not receive a return call yet from Dr. Christiane office. Will fax the referral over. Their fax #[phone].

## 2024-03-23 DIAGNOSIS — M25522 Pain in left elbow: Secondary | ICD-10-CM | POA: Diagnosis not present

## 2024-03-23 DIAGNOSIS — M25622 Stiffness of left elbow, not elsewhere classified: Secondary | ICD-10-CM | POA: Diagnosis not present

## 2024-03-23 NOTE — Telephone Encounter (Signed)
 Spoke with Dr. Christiane office again. They have not received the fax. Did attempt to send again this morning. Emailed as requested to [email or web address]

## 2024-03-25 ENCOUNTER — Ambulatory Visit (HOSPITAL_COMMUNITY)
Admission: RE | Admit: 2024-03-25 | Discharge: 2024-03-25 | Disposition: A | Discharge status: Home / Self Care | Source: Ambulatory Visit | Attending: Student in an Organized Health Care Education/Training Program | Admitting: Student in an Organized Health Care Education/Training Program

## 2024-03-25 DIAGNOSIS — I480 Paroxysmal atrial fibrillation: Secondary | ICD-10-CM | POA: Diagnosis not present

## 2024-03-25 MED ORDER — IOHEXOL 350 MG/ML SOLN
80.0000 mL | Freq: Once | INTRAVENOUS | Status: AC | PRN
  Administered 2024-03-25: 80 mL via INTRAVENOUS

## 2024-03-28 DIAGNOSIS — M25622 Stiffness of left elbow, not elsewhere classified: Secondary | ICD-10-CM | POA: Diagnosis not present

## 2024-03-28 DIAGNOSIS — M25522 Pain in left elbow: Secondary | ICD-10-CM | POA: Diagnosis not present

## 2024-03-29 ENCOUNTER — Telehealth: Payer: Self-pay

## 2024-03-29 NOTE — Telephone Encounter (Signed)
 Eddie Zimmerman called to confirm candidacy for LAAO after CT scan last week.  Informed him Eddie Zimmerman has not yet reviewed the CT, but Eddie Zimmerman will call once Eddie Zimmerman does to confirm plans for procedure 12/18 and send instructions.  Reiterated to him he has already had updated labs and EKG, so if anatomy is suitable, he will be sent instructions and be ready to go.  He will need to hold Jardiance and Ozempic. He was grateful for assistance.

## 2024-03-29 NOTE — Telephone Encounter (Signed)
 Received a return call from [facility] with Dr. Christiane office. She is going to get an update on the paperwork.

## 2024-03-29 NOTE — Telephone Encounter (Signed)
 Left another message with Dr. Christiane office regarding referral form. A nurse is supposed to return the call today.

## 2024-03-30 DIAGNOSIS — M533 Sacrococcygeal disorders, not elsewhere classified: Secondary | ICD-10-CM | POA: Diagnosis not present

## 2024-03-30 NOTE — Telephone Encounter (Signed)
 SABRA

## 2024-03-30 NOTE — Telephone Encounter (Signed)
 I have spoken again with Dr. Christiane office and have not heard back. Will bring the form to their office for signature.

## 2024-03-31 ENCOUNTER — Telehealth: Payer: Self-pay

## 2024-03-31 NOTE — Telephone Encounter (Signed)
 Broccoli anatomy Max 23/ AVG 22/ Depth 18 Likely use a 27mm device Inf/Mid TSP Steep angle: RAO 33 CAU 33

## 2024-04-01 ENCOUNTER — Ambulatory Visit (HOSPITAL_COMMUNITY)

## 2024-04-01 ENCOUNTER — Other Ambulatory Visit: Payer: Self-pay

## 2024-04-01 DIAGNOSIS — I480 Paroxysmal atrial fibrillation: Secondary | ICD-10-CM

## 2024-04-01 NOTE — Telephone Encounter (Signed)
 Per Dr. Almetta, patient can proceed with LAAO after reviewing CT/TruPlan.  Spoke with patient. Confirmed procedure date 12/18 at 0915 with arrival time 0645. Reviewed last dose Ozempic 12/8. Last dose Jardiance 12/14. Explained instruction letter will be sent via MyChart and he will receive pre-procedural call next week. He verbalized understanding and had no questions.   He was grateful for the call.

## 2024-04-04 DIAGNOSIS — S52022A Displaced fracture of olecranon process without intraarticular extension of left ulna, initial encounter for closed fracture: Secondary | ICD-10-CM | POA: Diagnosis not present

## 2024-04-07 ENCOUNTER — Telehealth: Payer: Self-pay

## 2024-04-07 NOTE — Telephone Encounter (Signed)
 Left voicemail for patient to return call to review pre-procedure instructions.

## 2024-04-08 NOTE — Telephone Encounter (Signed)
 Left 2nd message for patient to return call. Also left a message on wife's phone.

## 2024-04-11 NOTE — Telephone Encounter (Signed)
 Confirmed procedure date of 12/18. Confirmed arrival time of 0645 for procedure time at 0915. Reviewed pre-procedure instructions with patient. Contrast allergy? No PPM or defibrillator? No The patient understands to call if questions/concerns arise prior to procedure. The patient was grateful for call and agreed with plan.

## 2024-04-14 ENCOUNTER — Inpatient Hospital Stay (HOSPITAL_COMMUNITY)

## 2024-04-14 ENCOUNTER — Inpatient Hospital Stay (HOSPITAL_COMMUNITY): Admitting: Certified Registered"

## 2024-04-14 ENCOUNTER — Other Ambulatory Visit (HOSPITAL_COMMUNITY): Payer: Self-pay

## 2024-04-14 ENCOUNTER — Inpatient Hospital Stay (HOSPITAL_COMMUNITY)
Admission: RE | Admit: 2024-04-14 | Discharge: 2024-04-14 | DRG: 274 | Disposition: A | Attending: Student in an Organized Health Care Education/Training Program | Admitting: Student in an Organized Health Care Education/Training Program

## 2024-04-14 ENCOUNTER — Other Ambulatory Visit: Payer: Self-pay

## 2024-04-14 ENCOUNTER — Inpatient Hospital Stay (HOSPITAL_COMMUNITY)
Admission: RE | Disposition: A | Payer: Self-pay | Attending: Student in an Organized Health Care Education/Training Program

## 2024-04-14 ENCOUNTER — Encounter (HOSPITAL_COMMUNITY): Payer: Self-pay | Admitting: Student in an Organized Health Care Education/Training Program

## 2024-04-14 DIAGNOSIS — D6869 Other thrombophilia: Secondary | ICD-10-CM | POA: Diagnosis present

## 2024-04-14 DIAGNOSIS — E669 Obesity, unspecified: Secondary | ICD-10-CM | POA: Diagnosis present

## 2024-04-14 DIAGNOSIS — Z9884 Bariatric surgery status: Secondary | ICD-10-CM | POA: Diagnosis not present

## 2024-04-14 DIAGNOSIS — Z9181 History of falling: Secondary | ICD-10-CM | POA: Diagnosis not present

## 2024-04-14 DIAGNOSIS — I480 Paroxysmal atrial fibrillation: Secondary | ICD-10-CM

## 2024-04-14 DIAGNOSIS — Z8673 Personal history of transient ischemic attack (TIA), and cerebral infarction without residual deficits: Secondary | ICD-10-CM | POA: Diagnosis not present

## 2024-04-14 DIAGNOSIS — Z7901 Long term (current) use of anticoagulants: Secondary | ICD-10-CM | POA: Diagnosis not present

## 2024-04-14 DIAGNOSIS — E785 Hyperlipidemia, unspecified: Secondary | ICD-10-CM | POA: Diagnosis present

## 2024-04-14 DIAGNOSIS — R296 Repeated falls: Secondary | ICD-10-CM | POA: Diagnosis present

## 2024-04-14 DIAGNOSIS — G4733 Obstructive sleep apnea (adult) (pediatric): Secondary | ICD-10-CM | POA: Diagnosis present

## 2024-04-14 DIAGNOSIS — Z6827 Body mass index (BMI) 27.0-27.9, adult: Secondary | ICD-10-CM | POA: Diagnosis not present

## 2024-04-14 DIAGNOSIS — I2510 Atherosclerotic heart disease of native coronary artery without angina pectoris: Secondary | ICD-10-CM | POA: Diagnosis present

## 2024-04-14 DIAGNOSIS — Z006 Encounter for examination for normal comparison and control in clinical research program: Secondary | ICD-10-CM

## 2024-04-14 DIAGNOSIS — Z95818 Presence of other cardiac implants and grafts: Secondary | ICD-10-CM | POA: Diagnosis present

## 2024-04-14 DIAGNOSIS — E860 Dehydration: Secondary | ICD-10-CM | POA: Diagnosis present

## 2024-04-14 DIAGNOSIS — E1140 Type 2 diabetes mellitus with diabetic neuropathy, unspecified: Secondary | ICD-10-CM | POA: Diagnosis present

## 2024-04-14 DIAGNOSIS — I10 Essential (primary) hypertension: Secondary | ICD-10-CM | POA: Diagnosis present

## 2024-04-14 DIAGNOSIS — Z96653 Presence of artificial knee joint, bilateral: Secondary | ICD-10-CM | POA: Diagnosis present

## 2024-04-14 HISTORY — PX: LEFT ATRIAL APPENDAGE OCCLUSION: EP1229

## 2024-04-14 HISTORY — PX: TRANSESOPHAGEAL ECHOCARDIOGRAM (CATH LAB): EP1270

## 2024-04-14 LAB — SURGICAL PCR SCREEN
MRSA, PCR: NEGATIVE
Staphylococcus aureus: POSITIVE — AB

## 2024-04-14 LAB — TYPE AND SCREEN
ABO/RH(D): A POS
Antibody Screen: NEGATIVE

## 2024-04-14 LAB — GLUCOSE, CAPILLARY
Glucose-Capillary: 86 mg/dL (ref 70–99)
Glucose-Capillary: 87 mg/dL (ref 70–99)
Glucose-Capillary: 98 mg/dL (ref 70–99)

## 2024-04-14 LAB — ECHO TEE

## 2024-04-14 LAB — POCT ACTIVATED CLOTTING TIME: Activated Clotting Time: 327 s

## 2024-04-14 MED ORDER — SODIUM CHLORIDE 0.9 % IV SOLN: INTRAVENOUS | Status: DC

## 2024-04-14 MED ORDER — DEXAMETHASONE SOD PHOSPHATE PF 10 MG/ML IJ SOLN
INTRAMUSCULAR | Status: DC | PRN
  Administered 2024-04-14: 11:00:00 5 mg via INTRAVENOUS

## 2024-04-14 MED ORDER — PHENYLEPHRINE 80 MCG/ML (10ML) SYRINGE FOR IV PUSH (FOR BLOOD PRESSURE SUPPORT)
PREFILLED_SYRINGE | INTRAVENOUS | Status: DC | PRN
  Administered 2024-04-14: 10:00:00 80 ug via INTRAVENOUS

## 2024-04-14 MED ORDER — LIDOCAINE 2% (20 MG/ML) 5 ML SYRINGE
INTRAMUSCULAR | Status: DC | PRN
  Administered 2024-04-14: 10:00:00 100 mg via INTRAVENOUS

## 2024-04-14 MED ORDER — ONDANSETRON HCL 4 MG/2ML IJ SOLN
INTRAMUSCULAR | Status: DC | PRN
  Administered 2024-04-14: 11:00:00 4 mg via INTRAVENOUS

## 2024-04-14 MED ORDER — ASPIRIN 81 MG PO TBEC
81.0000 mg | DELAYED_RELEASE_TABLET | Freq: Every day | ORAL | 5 refills | Status: AC
  Filled 2024-04-14: qty 30, 30d supply, fill #0

## 2024-04-14 MED ORDER — SUGAMMADEX SODIUM 200 MG/2ML IV SOLN
INTRAVENOUS | Status: DC | PRN
  Administered 2024-04-14: 11:00:00 200 mg via INTRAVENOUS

## 2024-04-14 MED ORDER — INSULIN ASPART 100 UNIT/ML IJ SOLN: 0.0000 [IU] | INTRAMUSCULAR | Status: DC | PRN

## 2024-04-14 MED ORDER — FENTANYL CITRATE (PF) 100 MCG/2ML IJ SOLN
INTRAMUSCULAR | Status: AC
  Filled 2024-04-14: qty 2

## 2024-04-14 MED ORDER — FENTANYL CITRATE (PF) 250 MCG/5ML IJ SOLN
INTRAMUSCULAR | Status: DC | PRN
  Administered 2024-04-14: 10:00:00 50 ug via INTRAVENOUS

## 2024-04-14 MED ORDER — HEPARIN SODIUM (PORCINE) 1000 UNIT/ML IJ SOLN
INTRAMUSCULAR | Status: AC
  Filled 2024-04-14: qty 20

## 2024-04-14 MED ORDER — CEFAZOLIN SODIUM-DEXTROSE 2-4 GM/100ML-% IV SOLN
2.0000 g | INTRAVENOUS | Status: AC
  Administered 2024-04-14: 10:00:00 2 g via INTRAVENOUS
  Filled 2024-04-14: qty 100

## 2024-04-14 MED ORDER — ASPIRIN 81 MG PO TBEC
81.0000 mg | DELAYED_RELEASE_TABLET | Freq: Every day | ORAL | Status: DC
  Administered 2024-04-14: 14:00:00 81 mg via ORAL
  Filled 2024-04-14 (×2): qty 1

## 2024-04-14 MED ORDER — CHLORHEXIDINE GLUCONATE 0.12 % MT SOLN
OROMUCOSAL | Status: AC
  Administered 2024-04-14: 08:00:00 15 mL via OROMUCOSAL
  Filled 2024-04-14: qty 15

## 2024-04-14 MED ORDER — PROPOFOL 10 MG/ML IV BOLUS
INTRAVENOUS | Status: DC | PRN
  Administered 2024-04-14: 10:00:00 180 mg via INTRAVENOUS

## 2024-04-14 MED ORDER — PROTAMINE SULFATE 10 MG/ML IV SOLN
INTRAVENOUS | Status: DC | PRN
  Administered 2024-04-14: 11:00:00 40 mg via INTRAVENOUS

## 2024-04-14 MED ORDER — CLOPIDOGREL BISULFATE 75 MG PO TABS
ORAL_TABLET | ORAL | Status: AC
  Administered 2024-04-14: 13:00:00 75 mg via ORAL
  Filled 2024-04-14: qty 1

## 2024-04-14 MED ORDER — HEPARIN (PORCINE) IN NACL 2000-0.9 UNIT/L-% IV SOLN
INTRAVENOUS | Status: DC | PRN
  Administered 2024-04-14: 10:00:00 1000 mL

## 2024-04-14 MED ORDER — HEPARIN SODIUM (PORCINE) 1000 UNIT/ML IJ SOLN
INTRAMUSCULAR | Status: DC | PRN
  Administered 2024-04-14: 10:00:00 16000 [IU] via INTRAVENOUS

## 2024-04-14 MED ORDER — CLOPIDOGREL BISULFATE 75 MG PO TABS: 75.0000 mg | ORAL_TABLET | Freq: Every day | ORAL | Status: DC

## 2024-04-14 MED ORDER — ROCURONIUM BROMIDE 10 MG/ML (PF) SYRINGE
PREFILLED_SYRINGE | INTRAVENOUS | Status: DC | PRN
  Administered 2024-04-14: 10:00:00 60 mg via INTRAVENOUS

## 2024-04-14 MED ORDER — IOHEXOL 350 MG/ML SOLN
INTRAVENOUS | Status: DC | PRN
  Administered 2024-04-14: 11:00:00 20 mL

## 2024-04-14 MED ADMIN — Phenylephrine-NaCl IV Solution 20 MG/250ML-0.9%: 25 ug/min | INTRAVENOUS | @ 10:00:00 | NDC 99999070041

## 2024-04-14 MED FILL — Clopidogrel Bisulfate Tab 75 MG (Base Equiv): 75.0000 mg | ORAL | 30 days supply | Qty: 30 | Fill #0 | Status: CN

## 2024-04-14 NOTE — Op Note (Addendum)
° °  Procedure:  Watchman FLX Pro implant (CPT [id number])  Indication: recurrent falls, ecchymosis   Attending: Adina Primus, MD   HAS-BLED score 2 Hypertension No  Abnormal renal and liver function (Dialysis, transplant, Cr >2.26 mg/dL /Cirrhosis or Bilirubin >2x Normal or AST/ALT/AP >3x Normal) No  Stroke Yes  Bleeding No  Labile INR (Unstable/high INR) No  Elderly (>65) Yes  Drugs or alcohol (>= 8 drinks/week, anti-plt or NSAID) No    CHA2DS2-VASc Score = 8  The patient's score is based upon: CHF History: 1 HTN History: 1 Diabetes History: 1 Stroke History: 2 Vascular Disease History: 1 Age Score: 2 Gender Score: 0   NYHA class I    Procedure Summary: Vascular ultrasound and standard access needle used to access the right femoral vein. Perclose x2 for preclosure used for venotomy site.  A 7 Fr short sheath was inserted into the RFV and exchanged for a 17 Fr OD WATCHMAN TruSteer outer sheath with [employer] was advanced to the mid inferior septum.  TEE examination of the left atrial appendage was reviewed in multiple views. Maximum LAA ostial diameter 22 mm. Maximum depth was 25 mm. Based upon these images a 27 mm WATCHMAN FLX Pro device was selected. Heparinization with an ACT >350 seconds. Successful inferior and mid transeptal catheterization using the VersaCross Connect radiofrequency needle with ICE and fluoroscopic guidance.   LA pressure was 1 mmHg. 500 cc IVFs given.  We placed the wire in the left upper pulmonary vein, Versa Cross sheath pulled back into RA, and ICE was advanced into the LA over the guidewire. The outer Watchman sheath was advanced over the RF wire to the mid LAA. Multiple ICE views obtained to exclude thrombus and determine sizing of the Watchman device.  Contrast injection through the Watchman outer sheath in multiple views demonstrated a bilobed chicken wing morphology with a circular os. Contast injections confirmed the ICE measurements.   The  27 mm WATCHMAN FLX Pro device was deployed. The device was well seated in its final position at the LAA ostium and was stable to multiple tug tests. There was no TEE evidence of leak and all of the PASS criteria were met (position, anchor, size, and seal). The device was compressed 17-25% in 4 different ICE views. The WATCHMAN was released and the catheters were withdrawn to the right atrium. Final ICE examination revealed no effusion and a stable WATCHMAN position. Protamine  was given. Perclose proglide x2 utilized for hemostasis.   Summary: 1. Successful implant of Watchman FLX Pro 27 mm device  Recommendations: IP only procedure Discontinue Xarelto  today Start DAPT (ASA 81 mg daily, plavix  75 mg daily) for 6 months, d/c plavix  at 6 months and continue ASA 81 mg daily thereafter, first dose today at 1300  CT scan in 8 weeks  EP f/u to be scheduled   [name], MD [facility]  Cardiac Electrophysiology

## 2024-04-14 NOTE — Anesthesia Postprocedure Evaluation (Signed)
 Anesthesia Post Note  Patient: Eddie Zimmerman  Procedure(s) Performed: LEFT ATRIAL APPENDAGE OCCLUSION TRANSESOPHAGEAL ECHOCARDIOGRAM     Patient location during evaluation: Cath Lab Anesthesia Type: General Level of consciousness: awake Pain management: pain level controlled Vital Signs Assessment: post-procedure vital signs reviewed and stable Respiratory status: spontaneous breathing Cardiovascular status: blood pressure returned to baseline Postop Assessment: no apparent nausea or vomiting Anesthetic complications: no   There were no known notable events for this encounter.       Lauraine DASEN Colhoun

## 2024-04-14 NOTE — Discharge Instructions (Signed)
 WATCHMAN Procedure, Care After  Procedure MD: Dr. Almetta Olds Clinical Coordinator: [name] Greek, RN and Rockie Redman, RN  This sheet gives you information about how to care for yourself after your procedure. Your health care provider may also give you more specific instructions. If you have problems or questions, contact your health care provider.  What can I expect after the procedure? After the procedure, it is common to have: Bruising around your puncture site. Tenderness around your puncture site. Tiredness (fatigue).  Medication instructions It is very important to continue to take your blood thinner as directed by your doctor after the Watchman procedure. Call your procedure doctors office with question or concerns. If you are on Coumadin (warfarin), you will have your INR checked the week after your procedure, with a goal INR of 2.0 - 3.0. Please follow your medication instructions on your discharge summary. Only take the medications listed on your discharge paperwork.  Follow up You will be seen in 6 weeks after your procedure You will have a repeat CT scan or Echocardiogram approximately 8 weeks after your procedure mark to check your device You will follow up the MD/APP who performed your procedure 6 months after your procedure The Watchman Clinical Coordinator will check in with you from time to time, including 1 and 2 years after your procedure.  NO DENTAL CLEANINGS FOR 45 days. After that, you will require antibiotics for dental procedures the first 6 months.   Follow these instructions at home: Puncture site care  Follow instructions from your health care provider about how to take care of your puncture site. Make sure you: If present, leave stitches (sutures), skin glue, or adhesive strips in place.  If skin glue was used: leave for 1 week. If after one week it has not fallen/flaked off, ok to remove/peel off If a large square bandage is present, this may be  removed 24 hours after surgery.  Check your puncture site every day for signs of infection. Check for: Redness, swelling, or pain. Fluid or blood. If your puncture site starts to bleed, lie down on your back, apply firm pressure to the area, and contact your health care provider. Warmth. Pus or a bad smell. Driving Do not drive yourself home if you received sedation Do not drive for at least 4 days after your procedure or however long your health care provider recommends. (Do not resume driving if you have previously been instructed not to drive for other health reasons.) Do not spend greater than 1 hour at a time in a car for the first 3 days. Stop and take a break with a 5 minute walk at least every hour.  Do not drive or use heavy machinery while taking prescription pain medicine.  Activity Avoid activities that take a lot of effort, including exercise, for at least 7 days after your procedure. For the first 3 days, avoid sitting for longer than one hour at a time.  Avoid alcoholic beverages, signing paperwork, or participating in legal proceedings for 24 hours after receiving sedation Do not lift anything that is heavier than 10 lb (4.5 kg) for one week.  No sexual activity for 1 week.  Return to your normal activities as told by your health care provider. Ask your health care provider what activities are safe for you. General instructions Take over-the-counter and prescription medicines only as told by your health care provider. Do not use any products that contain nicotine or tobacco, such as cigarettes and e-cigarettes. If  you need help quitting, ask your health care provider. You may shower after 24 hours, but Do not take baths, swim, or use a hot tub for 1 week.  Do not drink alcohol for 24 hours after your procedure. Keep all follow-up visits as told by your health care provider. This is important. Dental Work: You will require antibiotics prior to any dental work, including  cleanings, for 6 months after your Watchman implantation to help protect you from infection. After 6 months, antibiotics are no longer required. Contact a health care provider if: You have redness, mild swelling, or pain around your puncture site. You have soreness in your throat or at your puncture site that does not improve after several days You have fluid or blood coming from your puncture site that stops after applying firm pressure to the area. Your puncture site feels warm to the touch. You have pus or a bad smell coming from your puncture site. You have a fever. You have chest pain or discomfort that spreads to your neck, jaw, or arm. You are sweating a lot. You feel nauseous. You have a fast or irregular heartbeat. You have shortness of breath. You are dizzy or light-headed and feel the need to lie down. You have pain or numbness in the arm or leg closest to your puncture site. Get help right away if: Your puncture site suddenly swells. Your puncture site is bleeding and the bleeding does not stop after applying firm pressure to the area. These symptoms may represent a serious problem that is an emergency. Do not wait to see if the symptoms will go away. Get medical help right away. Call your local emergency services (911 in the [place] Do not drive yourself to the hospital. Summary After the procedure, it is normal to have bruising and tenderness at the puncture site in your groin, neck, or forearm. Check your puncture site every day for signs of infection. Get help right away if your puncture site is bleeding and the bleeding does not stop after applying firm pressure to the area. This is a medical emergency.  This information is not intended to replace advice given to you by your health care provider. Make sure you discuss any questions you have with your health care provider.

## 2024-04-14 NOTE — Transfer of Care (Signed)
 Immediate Anesthesia Transfer of Care Note  Patient: Eddie Zimmerman  Procedure(s) Performed: LEFT ATRIAL APPENDAGE OCCLUSION TRANSESOPHAGEAL ECHOCARDIOGRAM  Patient Location: PACU and Cath Lab  Anesthesia Type:General  Level of Consciousness: awake, alert , and oriented  Airway & Oxygen Therapy: Patient Spontanous Breathing and Patient connected to nasal cannula oxygen  Post-op Assessment: Report given to RN and Post -op Vital signs reviewed and stable  Post vital signs: Reviewed and stable  Last Vitals:  Vitals Value Taken Time  BP    Temp    Pulse    Resp    SpO2      Last Pain:  Vitals:   04/14/24 0743  PainSc: 0-No pain         Complications: There were no known notable events for this encounter.

## 2024-04-14 NOTE — Anesthesia Procedure Notes (Signed)
 Arterial Line Insertion Start/End12/18/2025 9:15 AM, 04/14/2024 9:20 AM Performed by: Emmitt Millman, CRNA, CRNA  Patient location: Pre-op. Preanesthetic checklist: patient identified, IV checked, site marked, risks and benefits discussed, surgical consent, monitors and equipment checked, pre-op evaluation, timeout performed and anesthesia consent Lidocaine  1% used for infiltration Left, radial was placed Catheter size: 20 G Hand hygiene performed , maximum sterile barriers used  and Seldinger technique used Allen's test indicative of satisfactory collateral circulation Attempts: 1 Procedure performed using ultrasound to evaluate access site (image not retained). Ultrasound Notes:relevant anatomy identified, ultrasound used to visualize needle entry and vessel patent under ultrasound. Following insertion, dressing applied. Post procedure assessment: normal and unchanged  Patient tolerated the procedure well with no immediate complications.

## 2024-04-14 NOTE — Progress Notes (Signed)
 Patient is ready for discharge. Discharge instructions and paperwork were given to the patient and his wife at the bedside. Verbalized understanding of his instructions and all of his questions were answered. Incision site was clean, dry, and intact upon inspection. TOC sent his prescriptions to his local pharmacy in [place]. RN transported the patient to his vehicle.   Chanell Nadeau BSN RN

## 2024-04-14 NOTE — Anesthesia Preprocedure Evaluation (Addendum)
 Anesthesia Evaluation  Patient identified by MRN, date of birth, ID band Patient awake    Reviewed: Allergy & Precautions, NPO status , Patient's Chart, lab work & pertinent test results  History of Anesthesia Complications Negative for: history of anesthetic complications  Airway Mallampati: I  TM Distance: >3 FB Neck ROM: Full   Comment: Beard Dental  (+) Teeth Intact, Dental Advisory Given   Pulmonary sleep apnea    breath sounds clear to auscultation       Cardiovascular hypertension, + dysrhythmias (on Xarelto ) Atrial Fibrillation + Valvular Problems/Murmurs (Moderate AI (2016))  Rhythm:Irregular Rate:Normal  TEE (2016): - Left ventricle: The cavity size was normal. Wall thickness was    normal. Systolic function was normal. The estimated ejection    fraction was in the range of 55% to 60%. Wall motion was normal;    there were no regional wall motion abnormalities.  - Aortic valve: Mild thickening and calcification, consistent with    sclerosis. No evidence of vegetation. There was mild to moderate    regurgitation directed eccentrically in the LVOT and towards the    mitral anterior leaflet.  - Mitral valve: No evidence of vegetation. There was mild    regurgitation.  - Left atrium: No evidence of thrombus in the atrial cavity or    appendage. No evidence of thrombus in the appendage.  - Right atrium: No evidence of thrombus in the atrial cavity or    appendage.  - Atrial septum: No defect or patent foramen ovale was identified.    Echo contrast study showed no right-to-left atrial level shunt,    following an increase in RA pressure induced by provocative    maneuvers.  - Tricuspid valve: No evidence of vegetation.  - Pulmonic valve: No evidence of vegetation. There was trivial    regurgitation.     Neuro/Psych  PSYCHIATRIC DISORDERS  Depression   Dementia CVA    GI/Hepatic S/p Lap Banding Procedure    Endo/Other  diabetes, Type 2, Oral Hypoglycemic Agents    Renal/GU      Musculoskeletal   Abdominal   Peds  Hematology   Anesthesia Other Findings   Reproductive/Obstetrics                              Anesthesia Physical Anesthesia Plan  ASA: 3  Anesthesia Plan: General   Post-op Pain Management:    Induction: Intravenous  PONV Risk Score and Plan: 2 and Ondansetron , Propofol  infusion and Treatment may vary due to age or medical condition  Airway Management Planned: Oral ETT  Additional Equipment: Arterial line  Intra-op Plan:   Post-operative Plan: Extubation in OR  Informed Consent: I have reviewed the patients History and Physical, chart, labs and discussed the procedure including the risks, benefits and alternatives for the proposed anesthesia with the patient or authorized representative who has indicated his/her understanding and acceptance.     Dental advisory given  Plan Discussed with: CRNA  Anesthesia Plan Comments:          Anesthesia Quick Evaluation

## 2024-04-14 NOTE — Discharge Summary (Addendum)
 Electrophysiology Discharge Summary   Patient ID: Eddie Zimmerman,  MRN: [id number], DOB/AGE: [birth year] 78 y.o.  Admit date: 04/14/2024 Discharge date: 04/14/2024  Primary Care Physician: Rexanne Ingle, MD  Primary Cardiologist: Candyce Reek, MD  Electrophysiologist: [name], MD }    Primary Discharge Diagnosis:  Paroxysmal Atrial Fibrillation Secondary hypercoagulable state  Poor candidacy for long term anticoagulation due to significant bruising, falls, patient preference  Secondary Discharge Diagnosis:  Stroke Cryptogenic > loop > AFib HTN DM OSA  Procedures This Admission:  Watchman implant 04/14/24, Dr. Primus This study demonstrated: Summary: 1. Successful implant of Watchman FLX Pro 27 mm device Recommendations: 1.IP only procedure 2.Discontinue Xarelto  today 3.         Start DAPT (ASA 81 mg daily, plavix  75 mg daily) for 6 months, d/c plavix  at 6 months and continue ASA 81 mg daily thereafter, first dose today at 1300  4.         CT scan in 6 weeks  5.EP f/u to be scheduled    Brief HPI: Eddie Zimmerman is a 78 y.o. male with a history of Paroxysmal Atrial Fibrillation who saw Dr. Primus for evaluation of watchman.  Discussed prior loop with AFib though very low burden, without symptom, no plans for ablation.  Planned to proceed with watchman implant.   Hospital Course:  The patient was admitted and underwent left atrial appendage occlusive device placement as above.  The patient was monitored in the post procedure setting and has done very well with no concerns. Given this, he is being considered for same day discharge later today. His groin site has been stable without evidence of hematoma or bleeding. Wound care and restrictions were reviewed with the patient.   The patient has been scheduled for post procedure follow up with EP APP in approximately 6 weeks.  Discontinue Xarelto  (last dose last night) Start ASA 81mg  and Plavix  75mg   daily today to complete 6 months of therapy. They will require dental SBE for 6 month post op and should refrain from dental work or cleanings for the first 45 days post implant. SBE to be RXd at follow up.   Post procedure teaching was completed EP follow up is in place  A repeat CT scan will be performed in approximately 60 days to ensure proper seal of the device.    Physical Exam: Vitals:   04/14/24 1103 04/14/24 1113 04/14/24 1145 04/14/24 1332  BP:  128/84  127/69  Pulse: 82 (!) 59  60  Resp: (!) 23 11  11   Temp:  97.8 F (36.6 C) 97.8 F (36.6 C) 98.6 F (37 C)  TempSrc:  Oral  Oral  SpO2:  97%  92%  Weight:      Height:        GEN: Well nourished, well developed in no acute distress NECK: No JVD; No carotid bruits CARDIAC: Regular rate and rhythm, no murmurs, rubs, gallops RESPIRATORY:  Clear to auscultation without rales, wheezing or rhonchi  ABDOMEN: Soft, non-tender, non-distended EXTREMITIES:  No edema; No deformity. Groin site Stable     Discharge Medications:  Allergies as of 04/14/2024   No Known Allergies      Medication List     TAKE these medications    allopurinol  100 MG tablet Commonly known as: ZYLOPRIM  Take 100 mg by mouth See admin instructions. Take with 300 mg for a total of 400 mg at noon   allopurinol  300 MG tablet Commonly known as:  ZYLOPRIM  Take 300 mg by mouth See admin instructions. Take with 100 mg for a total of 400 mg at noon   amoxicillin 500 MG capsule Commonly known as: AMOXIL Take 2,000 mg by mouth See admin instructions. Take 4 capsules (2000 mg) by mouth one hour prior to dental procedure   aspirin  EC 81 MG tablet Take 1 tablet (81 mg total) by mouth daily. Swallow whole. Start taking on: April 15, 2024 Notes to patient: Next dose is 04/15/24   CLEAR EYES NATURAL TEARS OP Place 1 drop into both eyes 2 (two) times daily as needed (dry eyes).   clopidogrel  75 MG tablet Commonly known as: PLAVIX  Take 1 tablet (75  mg total) by mouth daily. Notes to patient: Next dose is 04/15/24   cycloSPORINE 0.05 % ophthalmic emulsion Commonly known as: RESTASIS Place 1 drop into both eyes daily as needed (dry eyes).   empagliflozin 10 MG Tabs tablet Commonly known as: JARDIANCE Take 10 mg by mouth daily.   escitalopram  20 MG tablet Commonly known as: LEXAPRO  Take 20 mg by mouth every morning.   hydrochlorothiazide 12.5 MG capsule Commonly known as: MICROZIDE Take 12.5 mg by mouth every morning.   hydroxychloroquine 200 MG tablet Commonly known as: PLAQUENIL Take 200 mg by mouth 2 (two) times daily.   lidocaine  5 % Commonly known as: Lidoderm  Place 1 patch onto the skin daily. Remove & Discard patch within 12 hours or as directed by MD   metFORMIN 1000 MG tablet Commonly known as: GLUCOPHAGE Take 1,000 mg by mouth 2 (two) times daily.   multivitamin with minerals Tabs tablet Take 1 tablet by mouth every morning. Men's One a Day   pioglitazone  15 MG tablet Commonly known as: ACTOS  Take 15 mg by mouth daily.   pregabalin 200 MG capsule Commonly known as: LYRICA Take 200 mg by mouth in the morning, at noon, and at bedtime. What changed: when to take this   PRESCRIPTION MEDICATION Inhale into the lungs at bedtime. CPAP   rosuvastatin  5 MG tablet Commonly known as: CRESTOR  Take 1 tablet (5 mg total) by mouth daily. TAKE 1 TABLET DAILY AT 6 P.M   traMADol 50 MG tablet Commonly known as: ULTRAM Take 25-50 mg by mouth 3 (three) times daily as needed for severe pain (pain score 7-10).   VITAMIN B-12 PO Take 1 tablet by mouth every morning.        Disposition:  Home with usual follow up as in AVS  Duration of Discharge Encounter:  APP Time: 58  Signed, Charlies Macario Arthur, PA-C  04/14/2024 1:57 PM

## 2024-04-14 NOTE — Progress Notes (Signed)
 Aline d/c'd from right radial. Manual pressure applied x . Small hematoma expressed. 4x4 dressing applied to site. Precautions explained. Verbalized understanding. Right groin dry/intact with no bleeding or hematoma. Ambulated in hallway without difficulty. Transported to 4E04. Report given.Charvi Gammage E

## 2024-04-14 NOTE — Plan of Care (Signed)

## 2024-04-14 NOTE — Interval H&P Note (Signed)
 History and Physical Interval Note:  04/14/2024 9:37 AM  Eddie Zimmerman  has presented today for surgery, with the diagnosis of Afib.  The various methods of treatment have been discussed with the patient and family. After consideration of risks, benefits and other options for treatment, the patient has consented to  Procedures: LEFT ATRIAL APPENDAGE OCCLUSION (N/A) TRANSESOPHAGEAL ECHOCARDIOGRAM (N/A) as a surgical intervention.  The patient's history has been reviewed, patient examined, no change in status, stable for surgery.  I have reviewed the patient's chart and labs.  Questions were answered to the patient's satisfaction.     [name]

## 2024-04-14 NOTE — H&P (Signed)
 Date: 04/14/24 ID:  Eddie Zimmerman, DOB August 14, 1945, MRN [id number] PCP: Rexanne Ingle, MD  [facility] Providers Cardiologist:  Candyce Reek, MD Electrophysiologist:  [name], MD    History of Present Illness Eddie Zimmerman is a 78 y.o. male with paroxysmal AF, cryptogenic CVA (2016) s/p ILR with identification of AF with subsequent removal (2018), coronary calcifications, DM2, HTN, HLD, OSA, and obesity s/p lap band surgery who presents for AF management and associated stroke risk. History of Present Illness Eddie Zimmerman is a 78 year old male with atrial fibrillation who presents for evaluation of stroke risk management. He is accompanied by his wife, Rojelio.   He has a history of atrial fibrillation, identified following a stroke that occurred after knee replacement surgery. A loop recorder confirmed the presence of atrial fibrillation, although the burden is reportedly low. He is currently on Xarelto  to manage stroke risk but experiences significant bruising as a side effect. He is interested in exploring alternative treatments to manage his condition without long-term anticoagulation.   He has a history of low blood pressure, which was notably low during a recent emergency room visit for back pain, where he was found to be dehydrated. His blood pressure improved after receiving fluids. He is on lisinopril  and hydrochlorothiazide for blood pressure management, but there is consideration of adjusting these medications due to low blood pressure readings.   He has diabetes and is on Jardiance for management. He also has neuropathy in his feet and has had both knees replaced, contributing to balance issues and frequent falls.   He experiences bruising from Xarelto  and has low blood pressure readings. He has a history of falls, likely due to neuropathy and knee replacements.   Today he presents to clinic for further evaluation of AF and stroke risk reduction.  He  would ideally like to discontinue Xarelto  as he has constant bruising and falls related to his neuropathy.   ROS: bruising, falls    Studies Reviewed  ECG review   03/17/24: NSR 68, PR 178, QRS 96, QT/c 392/416 06/25/23: SB 57, PR 230, QRS 96, QT/c 440/428    Risk Assessment/Calculations   CHA2DS2-VASc Score = 8  This indicates a 10.8% annual risk of stroke. The patient's score is based upon: CHF History: 1 HTN History: 1 Diabetes History: 1 Stroke History: 2 Vascular Disease History: 1 Age Score: 2 Gender Score: 0   Physical Exam VS:  BP 98/60 (BP Location: Left Arm, Patient Position: Sitting, Cuff Size: Large)   Pulse 70   Ht 5' 10 (1.778 m)   Wt 195 lb (88.5 kg)   SpO2 96%   BMI 27.98 kg/m          Wt Readings from Last 3 Encounters:  03/17/24 195 lb (88.5 kg)  01/05/24 190 lb (86.2 kg)  11/18/23 192 lb (87.1 kg)    GEN: Well nourished, well developed in no acute distress CARDIAC: RRR, no murmurs, rubs, gallops RESPIRATORY:  Clear to auscultation without rales, wheezing or rhonchi  EXTREMITIES:  No edema; No deformity    ASSESSMENT AND PLAN Eddie Zimmerman is a 78 y.o. male with paroxysmal AF, cryptogenic CVA (2016) s/p ILR with identification of AF with subsequent removal (2018), coronary calcifications, DM2, HTN, HLD, OSA, and obesity s/p lap band surgery who presents for AF management and associated stroke risk.   Paroxysmal AF Stroke risk reduction  [name] has not had any recurrent symptomatic AF episodes although he did  not have rhythm awareness prior to his stroke or while his ILR was implanted.  He only had brief episodes of atrial fibrillation when he had his ILR prior to explant and has had no sustained episodes of AF since explant.  With his extremely low burden of AF he is not interested in rhythm control options at this point which I think is reasonable as he is asymptomatic and has not had increasing burden.  He really would like to be off of  long-term OAC however his CHA2DS2-VASc of 8 I would recommend continuation of OAC or alternative stroke risk reduction with watchman implant.   I have seen Eddie Zimmerman in the office today who is being considered for a Watchman left atrial appendage closure device. I believe they will benefit from this procedure given their history of atrial fibrillation, CHA2DS2-VASc score of 8 and unadjusted ischemic stroke rate of 10.8% per year. Unfortunately, the patient is not felt to be a long term anticoagulation candidate secondary to bruising and recurrent falls as well as patient preference. The patient's chart has been reviewed and I feel that they would be a candidate for short term oral anticoagulation after Watchman implant.    It is my belief that after undergoing a LAA closure procedure, Eddie Zimmerman will not need long term anticoagulation which eliminates anticoagulation side effects and major bleeding risk.    Procedural risks for the Watchman implant have been reviewed with the patient including a 0.5% risk of stroke, <1% risk of perforation and <1% risk of device embolization. Other risks include bleeding, vascular damage, tamponade, worsening renal function, and death. The patient understands these risk and wishes to proceed.     The published clinical data on the safety and effectiveness of WATCHMAN include but are not limited to the following: - Holmes DR, Jess BEARD, Sick P et al. for the PROTECT AF Investigators. Percutaneous closure of the left atrial appendage versus warfarin therapy for prevention of stroke in patients with atrial fibrillation: a randomised non-inferiority trial. Lancet 2009; 374: 534-42. GLENWOOD Jess BEARD, Doshi SK, Jonita VEAR Satchel D et al. on behalf of the PROTECT AF Investigators. Percutaneous Left Atrial Appendage Closure for Stroke Prophylaxis in Patients With Atrial Fibrillation 2.3-Year Follow-up of the PROTECT AF (Watchman Left Atrial Appendage System for Embolic  Protection in Patients With Atrial Fibrillation) Trial. Circulation 2013; 127:720-729. - Alli O, Doshi S,  Kar S, Reddy VY, Sievert H et al. Quality of Life Assessment in the Randomized PROTECT AF (Percutaneous Closure of the Left Atrial Appendage Versus Warfarin Therapy for Prevention of Stroke in Patients With Atrial Fibrillation) Trial of Patients at Risk for Stroke With Nonvalvular Atrial Fibrillation. J Am Coll Cardiol 2013; 61:1790-8. GLENWOOD Satchel DR, Archer RAMAN, Price M, Whisenant B, Sievert H, Doshi S, Huber K, Reddy V. Prospective randomized evaluation of the Watchman left atrial appendage Device in patients with atrial fibrillation versus long-term warfarin therapy; the PREVAIL trial. Journal of the [employer] of Cardiology, Vol. 4, No. 1, 2014, 1-11. - Kar S, Doshi SK, Sadhu A, Horton R, Osorio J et al. Primary outcome evaluation of a next-generation left atrial appendage closure device: results from the PINNACLE FLX trial. Circulation 2021;143(18)1754-1762.    After today's visit with the patient which was dedicated solely for shared decision making visit regarding LAA closure device, the patient decided to proceed with the LAA appendage closure procedure scheduled to be done in the near future at [facility].   Prior to the  procedure, I would like to obtain a gated CT scan of the chest with contrast timed for PV/LA visualization.    HAS-BLED score 2 Hypertension No  Abnormal renal and liver function (Dialysis, transplant, Cr >2.26 mg/dL /Cirrhosis or Bilirubin >2x Normal or AST/ALT/AP >3x Normal) No  Stroke Yes  Bleeding No  Labile INR (Unstable/high INR) No  Elderly (>65) Yes  Drugs or alcohol (>= 8 drinks/week, anti-plt or NSAID) No    CHA2DS2-VASc Score = 8  The patient's score is based upon: CHF History: 1 HTN History: 1 Diabetes History: 1 Stroke History: 2 Vascular Disease History: 1 Age Score: 2 Gender Score: 0   Procedure details Procedure date:  04/14/24 Procedure plan: Watchman FLX Pro implant  GA/TEE Pre-procedural CT: pending/ordered Pre-procedural medications: Continue uninterrupted OAC (LD Xarelto  12/17 PM), hold all 12/18 AM medications

## 2024-04-14 NOTE — Anesthesia Procedure Notes (Signed)
 Procedure Name: Intubation Date/Time: 04/14/2024 10:05 AM  Performed by: Delores Dus, CRNAPre-anesthesia Checklist: Patient identified, Emergency Drugs available, Suction available and Patient being monitored Patient Re-evaluated:Patient Re-evaluated prior to induction Oxygen Delivery Method: Circle system utilized Preoxygenation: Pre-oxygenation with 100% oxygen Induction Type: IV induction Ventilation: Mask ventilation without difficulty Laryngoscope Size: Miller and 2 Grade View: Grade I Tube type: Oral Tube size: 7.0 mm Number of attempts: 1 Airway Equipment and Method: Stylet and Oral airway Placement Confirmation: ETT inserted through vocal cords under direct vision, positive ETCO2 and breath sounds checked- equal and bilateral Secured at: 22 cm Tube secured with: Tape Dental Injury: Teeth and Oropharynx as per pre-operative assessment

## 2024-04-15 ENCOUNTER — Telehealth: Payer: Self-pay

## 2024-04-15 DIAGNOSIS — Z95818 Presence of other cardiac implants and grafts: Secondary | ICD-10-CM

## 2024-04-15 DIAGNOSIS — I480 Paroxysmal atrial fibrillation: Secondary | ICD-10-CM

## 2024-04-15 MED FILL — Fentanyl Citrate Preservative Free (PF) Inj 100 MCG/2ML: INTRAMUSCULAR | Qty: 1 | Status: AC

## 2024-04-15 NOTE — Telephone Encounter (Signed)
 Left message for patient to return call to see how he is doing s/p LAAO 04/14/24.

## 2024-04-18 NOTE — Telephone Encounter (Signed)
" °  [facility]   Watchman Team  Contacted the patient regarding discharge from [facility] on 04/14/2024.  The patient understands to follow up with Charlies Arthur on 06/01/2024.  The patient understands discharge instructions? Yes  The patient understands medications and regimen? Yes   The patient reports groin sites are healing well with no pain, swelling or drainage.  Scheduled post procedure imaging on 06/13/2024.  Reiterated to patient to avoid dental visits prior to their upcoming follow-up appointment. Instructed them to call after that visit if they have a dental visit so antibiotics may be called in. Reiterated they will need dental SBE through 6 months post-procedure.  Patient wanted to extend a personal thank you to Dr. Almetta. He was impressed with how detailed oriented he was. He was very grateful.   The patient understands to call with any questions or concerns prior to scheduled visit.   The patient was grateful for call and agreed with plan.   "

## 2024-05-25 ENCOUNTER — Other Ambulatory Visit: Payer: Self-pay | Admitting: Cardiology

## 2024-05-25 ENCOUNTER — Encounter: Payer: Self-pay | Admitting: Physician Assistant

## 2024-05-25 DIAGNOSIS — I480 Paroxysmal atrial fibrillation: Secondary | ICD-10-CM

## 2024-05-25 NOTE — Progress Notes (Unsigned)
 " Cardiology Office Note:  .   Date:  05/25/2024  ID:  Eddie Zimmerman, DOB 05/25/1945, MRN [id number] PCP: Rexanne Ingle, MD  [facility] Providers Cardiologist:  Candyce Reek, MD Electrophysiologist:  [name], MD {  History of Present Illness: .   Eddie Zimmerman is a 79 y.o. male w/PMHx of  HTN, DM, OSA Cryptogenic Stroke > loop > AFib  Referred to Dr. Primus for consideration of watchman due to significant bruising, falls, patient preference Pt with low burden of AFib, minimal suymptoms > not planned for ablation  04/04/24 1. Successful implant of Watchman FLX Pro 27 mm device Recommendations: 1.IP only procedure 2.Discontinue Xarelto  today 3.         Start DAPT (ASA 81 mg daily, plavix  75 mg daily) for 6 months, d/c plavix  at 6 months and continue ASA 81 mg daily thereafter, first dose today at 1300  4.         CT scan in 6 weeks    Today's visit is scheduled as his 6 week post LAAO visit ROS:   He is doing well No post procedural concerns, groin healed well. No symptoms of CP, palpitations or cardiac awareness Noted he bruises much less on DAPT Has 6 small dogs, keep him busy, he and his wife like to gamble, they travel to casinos, he reports good exertional capacity   Arrhythmia/AAD hx AFib, noted via ILR found 2016 Watchman implant Dec 2025  Studies Reviewed: SABRA    EKG not done today  04/14/24: TEE  1. Well placed 27 mm Watchman FLX device. No large shoulder, negative  tugg test, no leak by color flow Average compression using 3D MPR 15%.   2. Left ventricular ejection fraction, by estimation, is 60 to 65%. The  left ventricle has normal function. The left ventricle has no regional  wall motion abnormalities.   3. Right ventricular systolic function is normal. The right ventricular  size is normal.   4. Left atrial size was moderately dilated. No left atrial/left atrial  appendage thrombus was detected.   5. The mitral valve is  normal in structure. Mild mitral valve  regurgitation. No evidence of mitral stenosis.   6. The aortic valve is tricuspid. Aortic valve regurgitation is mild.  Aortic valve sclerosis is present, with no evidence of aortic valve  stenosis.   7. The inferior vena cava is normal in size with greater than 50%  respiratory variability, suggesting right atrial pressure of 3 mmHg.   8. 3D performed of the LAA and demonstrates 3D LAA imaging done to size  Watchman device.   9. Patient is under general anesthesia for procedure.  10. Post trans septal puncture only left to right shunting.   Conclusion(s)/Recommendation(s): Normal biventricular function without  evidence of hemodynamically significant valvular heart disease.     Risk Assessment/Calculations:    Physical Exam:   VS:  There were no vitals taken for this visit.   Wt Readings from Last 3 Encounters:  04/14/24 193 lb (87.5 kg)  03/17/24 195 lb (88.5 kg)  01/05/24 190 lb (86.2 kg)    GEN: Well nourished, well developed in no acute distress NECK: No JVD; No carotid bruits CARDIAC: RRR, no murmurs, rubs, gallops RESPIRATORY:   CTA b/l without rales, wheezing or rhonchi  ABDOMEN: Soft, non-tender, non-distended EXTREMITIES:  No edema; No deformity   ASSESSMENT AND PLAN: .    paroxysmal AFib CHA2DS2Vasc is 4, off OAC s/p watchman Plan: DAPT (ASA  81 mg daily, plavix  75 mg daily) for 6 months, d/c plavix  at 6 months and continue ASA 81 mg daily thereafter  CT scheduled 06/13/24, BMET today Discussed/Rx SBE prophylaxis Likely to move to ASA alone 83mo post implant  Secondary hypercoagulable state 2/2 AFib   Dispo: will have him back in ~ 92mo (6 mo post implant), sooner if needed  Signed, Charlies Macario Arthur, PA-C   "

## 2024-06-01 ENCOUNTER — Ambulatory Visit: Admitting: Physician Assistant

## 2024-06-01 VITALS — BP 114/64 | HR 67 | Ht 70.0 in | Wt 209.0 lb

## 2024-06-01 DIAGNOSIS — Z95818 Presence of other cardiac implants and grafts: Secondary | ICD-10-CM

## 2024-06-01 DIAGNOSIS — I480 Paroxysmal atrial fibrillation: Secondary | ICD-10-CM

## 2024-06-01 DIAGNOSIS — D6869 Other thrombophilia: Secondary | ICD-10-CM | POA: Diagnosis not present

## 2024-06-01 LAB — BASIC METABOLIC PANEL WITH GFR
BUN/Creatinine Ratio: 16 (ref 10–24)
BUN: 20 mg/dL (ref 8–27)
CO2: 22 mmol/L (ref 20–29)
Calcium: 9.6 mg/dL (ref 8.6–10.2)
Chloride: 106 mmol/L (ref 96–106)
Creatinine, Ser: 1.26 mg/dL (ref 0.76–1.27)
Glucose: 147 mg/dL — ABNORMAL HIGH (ref 70–99)
Potassium: 4.5 mmol/L (ref 3.5–5.2)
Sodium: 143 mmol/L (ref 134–144)
eGFR: 59 mL/min/{1.73_m2} — ABNORMAL LOW

## 2024-06-01 MED ORDER — AMOXICILLIN 500 MG PO CAPS: 2000.0000 mg | ORAL_CAPSULE | ORAL | 1 refills | Status: DC

## 2024-06-01 MED ORDER — AMOXICILLIN 500 MG PO CAPS: 2000.0000 mg | ORAL_CAPSULE | ORAL | 1 refills | Status: AC

## 2024-06-01 NOTE — Patient Instructions (Addendum)
 Medication Instructions:   Your physician recommends that you continue on your current medications as directed. Please refer to the Current Medication list given to you today.   *If you need a refill on your cardiac medications before your next appointment, please call your pharmacy*   Lab Work:  PLEASE GO DOWN STAIRS  [facility]  FIRST FLOOR   ( GET OFF ELEVATORS WALK TOWARDS WAITING AREA LAB LOCATED BY PHARMACY):  BMET  TODAY      If you have labs (blood work) drawn today and your tests are completely normal, you will receive your results only by: MyChart Message (if you have MyChart) OR A paper copy in the mail If you have any lab test that is abnormal or we need to change your treatment, we will call you to review the results.   Testing/Procedures: NONE ORDERED  TODAY    Follow-Up: At [facility], you and your health needs are our priority.  As part of our continuing mission to provide you with exceptional heart care, our providers are all part of one team.  This team includes your primary Cardiologist (physician) and Advanced Practice Providers or APPs (Physician Assistants and Nurse Practitioners) who all work together to provide you with the care you need, when you need it.  Your next appointment:   5 month(s)   Provider:   Donnice Primus, MD  ( CONTACT  CASSIE HALL/ ANGELINE HAMMER FOR EP SCHEDULING ISSUES )   We recommend signing up for the patient portal called MyChart.  Sign up information is provided on this After Visit Summary.  MyChart is used to connect with patients for Virtual Visits (Telemedicine).  Patients are able to view lab/test results, encounter notes, upcoming appointments, etc.  Non-urgent messages can be sent to your provider as well.   To learn more about what you can do with MyChart, go to [email or web address].   Other Instructions

## 2024-06-02 ENCOUNTER — Ambulatory Visit: Payer: Self-pay | Admitting: Physician Assistant

## 2024-06-13 ENCOUNTER — Ambulatory Visit (HOSPITAL_COMMUNITY)
